# Patient Record
Sex: Male | Born: 1940 | Race: Black or African American | Hispanic: No | Marital: Married | Smoking: Former smoker
Health system: Southern US, Community
[De-identification: ages and names within clinical notes are randomized; demographics above are authoritative.]

## PROBLEM LIST (undated history)

## (undated) DIAGNOSIS — I639 Cerebral infarction, unspecified: Secondary | ICD-10-CM

## (undated) DIAGNOSIS — I10 Essential (primary) hypertension: Secondary | ICD-10-CM

## (undated) DIAGNOSIS — E7800 Pure hypercholesterolemia, unspecified: Secondary | ICD-10-CM

## (undated) DIAGNOSIS — I219 Acute myocardial infarction, unspecified: Secondary | ICD-10-CM

## (undated) DIAGNOSIS — F0390 Unspecified dementia without behavioral disturbance: Secondary | ICD-10-CM

## (undated) HISTORY — PX: CORONARY ANGIOPLASTY: SHX604

---

## 2015-06-23 DIAGNOSIS — I6523 Occlusion and stenosis of bilateral carotid arteries: Secondary | ICD-10-CM | POA: Diagnosis not present

## 2015-06-23 DIAGNOSIS — I639 Cerebral infarction, unspecified: Secondary | ICD-10-CM | POA: Diagnosis not present

## 2015-06-23 DIAGNOSIS — R29818 Other symptoms and signs involving the nervous system: Secondary | ICD-10-CM | POA: Diagnosis not present

## 2015-06-23 DIAGNOSIS — R4182 Altered mental status, unspecified: Secondary | ICD-10-CM | POA: Diagnosis not present

## 2015-06-23 DIAGNOSIS — I10 Essential (primary) hypertension: Secondary | ICD-10-CM | POA: Diagnosis not present

## 2015-06-23 DIAGNOSIS — I6601 Occlusion and stenosis of right middle cerebral artery: Secondary | ICD-10-CM | POA: Diagnosis not present

## 2015-06-23 DIAGNOSIS — R531 Weakness: Secondary | ICD-10-CM | POA: Diagnosis not present

## 2015-06-24 DIAGNOSIS — I639 Cerebral infarction, unspecified: Secondary | ICD-10-CM | POA: Diagnosis not present

## 2015-06-24 DIAGNOSIS — I679 Cerebrovascular disease, unspecified: Secondary | ICD-10-CM | POA: Diagnosis not present

## 2015-06-24 DIAGNOSIS — R531 Weakness: Secondary | ICD-10-CM | POA: Diagnosis not present

## 2015-06-24 DIAGNOSIS — J929 Pleural plaque without asbestos: Secondary | ICD-10-CM | POA: Diagnosis not present

## 2015-06-24 DIAGNOSIS — I10 Essential (primary) hypertension: Secondary | ICD-10-CM | POA: Diagnosis not present

## 2015-06-24 DIAGNOSIS — R079 Chest pain, unspecified: Secondary | ICD-10-CM | POA: Diagnosis not present

## 2015-06-24 DIAGNOSIS — R918 Other nonspecific abnormal finding of lung field: Secondary | ICD-10-CM | POA: Diagnosis not present

## 2015-06-25 DIAGNOSIS — R600 Localized edema: Secondary | ICD-10-CM | POA: Diagnosis not present

## 2015-06-25 DIAGNOSIS — R531 Weakness: Secondary | ICD-10-CM | POA: Diagnosis not present

## 2015-06-25 DIAGNOSIS — I639 Cerebral infarction, unspecified: Secondary | ICD-10-CM | POA: Diagnosis not present

## 2015-06-26 DIAGNOSIS — I639 Cerebral infarction, unspecified: Secondary | ICD-10-CM | POA: Diagnosis not present

## 2015-06-26 DIAGNOSIS — I10 Essential (primary) hypertension: Secondary | ICD-10-CM | POA: Diagnosis not present

## 2015-06-27 DIAGNOSIS — I69391 Dysphagia following cerebral infarction: Secondary | ICD-10-CM | POA: Diagnosis not present

## 2015-06-28 DIAGNOSIS — R1310 Dysphagia, unspecified: Secondary | ICD-10-CM | POA: Diagnosis not present

## 2015-06-28 DIAGNOSIS — Z4659 Encounter for fitting and adjustment of other gastrointestinal appliance and device: Secondary | ICD-10-CM | POA: Diagnosis not present

## 2015-06-28 DIAGNOSIS — J984 Other disorders of lung: Secondary | ICD-10-CM | POA: Diagnosis not present

## 2015-06-28 DIAGNOSIS — R531 Weakness: Secondary | ICD-10-CM | POA: Diagnosis not present

## 2015-06-28 DIAGNOSIS — I6789 Other cerebrovascular disease: Secondary | ICD-10-CM | POA: Diagnosis not present

## 2015-06-30 DIAGNOSIS — I6789 Other cerebrovascular disease: Secondary | ICD-10-CM | POA: Diagnosis not present

## 2015-07-02 DIAGNOSIS — M6281 Muscle weakness (generalized): Secondary | ICD-10-CM | POA: Diagnosis not present

## 2015-07-02 DIAGNOSIS — F05 Delirium due to known physiological condition: Secondary | ICD-10-CM | POA: Diagnosis not present

## 2015-07-02 DIAGNOSIS — R54 Age-related physical debility: Secondary | ICD-10-CM | POA: Diagnosis not present

## 2015-07-02 DIAGNOSIS — H05229 Edema of unspecified orbit: Secondary | ICD-10-CM | POA: Diagnosis not present

## 2015-07-02 DIAGNOSIS — R278 Other lack of coordination: Secondary | ICD-10-CM | POA: Diagnosis not present

## 2015-07-02 DIAGNOSIS — R261 Paralytic gait: Secondary | ICD-10-CM | POA: Diagnosis not present

## 2015-07-02 DIAGNOSIS — R41841 Cognitive communication deficit: Secondary | ICD-10-CM | POA: Diagnosis not present

## 2015-07-02 DIAGNOSIS — I639 Cerebral infarction, unspecified: Secondary | ICD-10-CM | POA: Diagnosis not present

## 2015-07-02 DIAGNOSIS — Z9181 History of falling: Secondary | ICD-10-CM | POA: Diagnosis not present

## 2015-07-02 DIAGNOSIS — I6789 Other cerebrovascular disease: Secondary | ICD-10-CM | POA: Diagnosis not present

## 2015-07-02 DIAGNOSIS — E785 Hyperlipidemia, unspecified: Secondary | ICD-10-CM | POA: Diagnosis not present

## 2015-07-02 DIAGNOSIS — Z7409 Other reduced mobility: Secondary | ICD-10-CM | POA: Diagnosis not present

## 2015-07-02 DIAGNOSIS — I519 Heart disease, unspecified: Secondary | ICD-10-CM | POA: Diagnosis not present

## 2015-07-02 DIAGNOSIS — F5101 Primary insomnia: Secondary | ICD-10-CM | POA: Diagnosis not present

## 2015-07-02 DIAGNOSIS — I69354 Hemiplegia and hemiparesis following cerebral infarction affecting left non-dominant side: Secondary | ICD-10-CM | POA: Diagnosis not present

## 2015-07-02 DIAGNOSIS — R1312 Dysphagia, oropharyngeal phase: Secondary | ICD-10-CM | POA: Diagnosis not present

## 2015-07-02 DIAGNOSIS — I10 Essential (primary) hypertension: Secondary | ICD-10-CM | POA: Diagnosis not present

## 2015-07-02 DIAGNOSIS — M1380 Other specified arthritis, unspecified site: Secondary | ICD-10-CM | POA: Diagnosis not present

## 2015-07-02 DIAGNOSIS — R410 Disorientation, unspecified: Secondary | ICD-10-CM | POA: Diagnosis not present

## 2015-07-31 DIAGNOSIS — I10 Essential (primary) hypertension: Secondary | ICD-10-CM | POA: Diagnosis not present

## 2015-07-31 DIAGNOSIS — R410 Disorientation, unspecified: Secondary | ICD-10-CM | POA: Diagnosis not present

## 2015-07-31 DIAGNOSIS — Z9181 History of falling: Secondary | ICD-10-CM | POA: Diagnosis not present

## 2015-07-31 DIAGNOSIS — R54 Age-related physical debility: Secondary | ICD-10-CM | POA: Diagnosis not present

## 2015-07-31 DIAGNOSIS — M1380 Other specified arthritis, unspecified site: Secondary | ICD-10-CM | POA: Diagnosis not present

## 2015-07-31 DIAGNOSIS — I639 Cerebral infarction, unspecified: Secondary | ICD-10-CM | POA: Diagnosis not present

## 2015-07-31 DIAGNOSIS — I519 Heart disease, unspecified: Secondary | ICD-10-CM | POA: Diagnosis not present

## 2015-07-31 DIAGNOSIS — F05 Delirium due to known physiological condition: Secondary | ICD-10-CM | POA: Diagnosis not present

## 2015-08-11 DIAGNOSIS — I519 Heart disease, unspecified: Secondary | ICD-10-CM | POA: Diagnosis not present

## 2015-08-11 DIAGNOSIS — I10 Essential (primary) hypertension: Secondary | ICD-10-CM | POA: Diagnosis not present

## 2015-08-11 DIAGNOSIS — Z9181 History of falling: Secondary | ICD-10-CM | POA: Diagnosis not present

## 2015-08-11 DIAGNOSIS — R54 Age-related physical debility: Secondary | ICD-10-CM | POA: Diagnosis not present

## 2015-08-11 DIAGNOSIS — F05 Delirium due to known physiological condition: Secondary | ICD-10-CM | POA: Diagnosis not present

## 2015-08-11 DIAGNOSIS — H05229 Edema of unspecified orbit: Secondary | ICD-10-CM | POA: Diagnosis not present

## 2015-08-11 DIAGNOSIS — R410 Disorientation, unspecified: Secondary | ICD-10-CM | POA: Diagnosis not present

## 2015-08-11 DIAGNOSIS — I639 Cerebral infarction, unspecified: Secondary | ICD-10-CM | POA: Diagnosis not present

## 2015-08-18 ENCOUNTER — Ambulatory Visit (INDEPENDENT_AMBULATORY_CARE_PROVIDER_SITE_OTHER): Payer: Medicare Other | Admitting: Family Medicine

## 2015-08-18 ENCOUNTER — Encounter: Payer: Self-pay | Admitting: Family Medicine

## 2015-08-18 VITALS — BP 102/60 | HR 64 | Resp 16 | Wt 125.4 lb

## 2015-08-18 DIAGNOSIS — I10 Essential (primary) hypertension: Secondary | ICD-10-CM

## 2015-08-18 DIAGNOSIS — E785 Hyperlipidemia, unspecified: Secondary | ICD-10-CM | POA: Diagnosis not present

## 2015-08-18 DIAGNOSIS — Z87891 Personal history of nicotine dependence: Secondary | ICD-10-CM | POA: Diagnosis not present

## 2015-08-18 DIAGNOSIS — F1011 Alcohol abuse, in remission: Secondary | ICD-10-CM

## 2015-08-18 DIAGNOSIS — F1010 Alcohol abuse, uncomplicated: Secondary | ICD-10-CM | POA: Diagnosis not present

## 2015-08-18 DIAGNOSIS — Z79899 Other long term (current) drug therapy: Secondary | ICD-10-CM

## 2015-08-18 DIAGNOSIS — I6930 Unspecified sequelae of cerebral infarction: Secondary | ICD-10-CM | POA: Diagnosis not present

## 2015-08-18 LAB — CBC WITH DIFFERENTIAL/PLATELET
BASOS ABS: 0 {cells}/uL (ref 0–200)
BASOS PCT: 0 %
EOS ABS: 120 {cells}/uL (ref 15–500)
Eosinophils Relative: 2 %
HEMATOCRIT: 39.1 % (ref 38.5–50.0)
HEMOGLOBIN: 13.3 g/dL (ref 13.2–17.1)
LYMPHS ABS: 2280 {cells}/uL (ref 850–3900)
Lymphocytes Relative: 38 %
MCH: 32 pg (ref 27.0–33.0)
MCHC: 34 g/dL (ref 32.0–36.0)
MCV: 94 fL (ref 80.0–100.0)
MONO ABS: 780 {cells}/uL (ref 200–950)
MPV: 9.4 fL (ref 7.5–12.5)
Monocytes Relative: 13 %
NEUTROS ABS: 2820 {cells}/uL (ref 1500–7800)
Neutrophils Relative %: 47 %
Platelets: 306 10*3/uL (ref 140–400)
RBC: 4.16 MIL/uL — ABNORMAL LOW (ref 4.20–5.80)
RDW: 13.8 % (ref 11.0–15.0)
WBC: 6 10*3/uL (ref 4.0–10.5)

## 2015-08-18 NOTE — Progress Notes (Signed)
   Subjective:    Patient ID: Calvin Diaz, male    DOB: 1940/12/22, 75 y.o.   MRN: [id number]  HPI Here for an initial visit after moving to this state to live with his brother. He was diagnosed with a CVA in May. After the hospital evaluation and treatment he was sent to a rehabilitation unit and has subsequently moved here to be with his brother on July 21. He has been seen by PT, OT, speech therapy and also Child psychotherapist. He has a history of alcohol abuse as well as cigarette abuse. Presently he is not to do either. He continues on his statin drug as well as blood pressure medication and several other medications including famotidine. He has no particular concerns or complaints. His wife and brother are in the room with him. He also is using and Namenda.   Review of Systems     Objective:   Physical Exam Alert and in no distress. Oriented only to person. Tympanic membranes and canals are normal. Pharyngeal area is normal. Neck is supple without adenopathy or thyromegaly. Cardiac exam shows a regular sinus rhythm without murmurs or gallops. Lungs are clear to auscultation.        Assessment & Plan:  History of CVA with residual deficit - Plan: CBC with Differential/Platelet, Comprehensive metabolic panel  Former smoker  History of alcohol abuse - Plan: CBC with Differential/Platelet, Comprehensive metabolic panel  Hyperlipidemia - Plan: Lipid panel  Essential hypertension - Plan: CBC with Differential/Platelet, Comprehensive metabolic panel  Encounter for long-term (current) use of medications - Plan: CBC with Differential/Platelet, Comprehensive metabolic panel, Lipid panel Recommend stopping some of the medications including famotidine and possibly his symptoms supplement. I will do routine blood screening on him. He will remain alcohol and cigarette free and continue on his other medications. I will ask for her discomfort from the hospital and from the rehabilitation facility.  Follow-up here in one month.

## 2015-08-19 LAB — COMPREHENSIVE METABOLIC PANEL
ALBUMIN: 3.8 g/dL (ref 3.6–5.1)
ALT: 14 U/L (ref 9–46)
AST: 16 U/L (ref 10–35)
Alkaline Phosphatase: 52 U/L (ref 40–115)
BUN: 16 mg/dL (ref 7–25)
CHLORIDE: 98 mmol/L (ref 98–110)
CO2: 25 mmol/L (ref 20–31)
Calcium: 9.3 mg/dL (ref 8.6–10.3)
Creat: 0.98 mg/dL (ref 0.70–1.18)
Glucose, Bld: 97 mg/dL (ref 65–99)
POTASSIUM: 4.1 mmol/L (ref 3.5–5.3)
Sodium: 135 mmol/L (ref 135–146)
TOTAL PROTEIN: 7.5 g/dL (ref 6.1–8.1)
Total Bilirubin: 0.4 mg/dL (ref 0.2–1.2)

## 2015-08-19 LAB — LIPID PANEL
CHOL/HDL RATIO: 2.2 ratio (ref <=5.0)
CHOLESTEROL: 127 mg/dL (ref 125–200)
HDL: 57 mg/dL (ref >=40)
LDL Cholesterol: 57 mg/dL (ref <130)
TRIGLYCERIDES: 64 mg/dL (ref <150)
VLDL: 13 mg/dL (ref <30)

## 2015-08-20 ENCOUNTER — Telehealth: Payer: Self-pay | Admitting: Family Medicine

## 2015-08-20 NOTE — Telephone Encounter (Signed)
Received a call from [place] with [facility]. She states the home health services was requested for this pt by a hospital in [place]. She is requesting orders for home health. Please call Marchelle Folks at [phone].

## 2015-08-21 NOTE — Telephone Encounter (Signed)
Set this up 

## 2015-08-23 NOTE — Telephone Encounter (Signed)
Left message for Amanda to call me back.

## 2015-08-24 NOTE — Telephone Encounter (Signed)
Called and left another message to call me back.

## 2015-08-27 NOTE — Telephone Encounter (Signed)
Talked with Marchelle Folks about orders 2 x a week for 8 weeks nursing gave verbal order okay and for them to send over papers to sign

## 2015-09-06 ENCOUNTER — Emergency Department (HOSPITAL_COMMUNITY): Payer: Medicare Other

## 2015-09-06 ENCOUNTER — Encounter (HOSPITAL_COMMUNITY): Payer: Self-pay | Admitting: *Deleted

## 2015-09-06 DIAGNOSIS — F0390 Unspecified dementia without behavioral disturbance: Secondary | ICD-10-CM | POA: Diagnosis present

## 2015-09-06 DIAGNOSIS — R079 Chest pain, unspecified: Secondary | ICD-10-CM | POA: Diagnosis present

## 2015-09-06 DIAGNOSIS — E871 Hypo-osmolality and hyponatremia: Secondary | ICD-10-CM | POA: Diagnosis not present

## 2015-09-06 DIAGNOSIS — I4891 Unspecified atrial fibrillation: Secondary | ICD-10-CM | POA: Diagnosis not present

## 2015-09-06 DIAGNOSIS — Z79899 Other long term (current) drug therapy: Secondary | ICD-10-CM

## 2015-09-06 DIAGNOSIS — I480 Paroxysmal atrial fibrillation: Secondary | ICD-10-CM | POA: Diagnosis not present

## 2015-09-06 DIAGNOSIS — K219 Gastro-esophageal reflux disease without esophagitis: Secondary | ICD-10-CM | POA: Diagnosis present

## 2015-09-06 DIAGNOSIS — E785 Hyperlipidemia, unspecified: Secondary | ICD-10-CM | POA: Diagnosis not present

## 2015-09-06 DIAGNOSIS — Z7289 Other problems related to lifestyle: Secondary | ICD-10-CM

## 2015-09-06 DIAGNOSIS — I10 Essential (primary) hypertension: Secondary | ICD-10-CM | POA: Diagnosis present

## 2015-09-06 DIAGNOSIS — R0789 Other chest pain: Secondary | ICD-10-CM | POA: Diagnosis not present

## 2015-09-06 DIAGNOSIS — Z8673 Personal history of transient ischemic attack (TIA), and cerebral infarction without residual deficits: Secondary | ICD-10-CM

## 2015-09-06 DIAGNOSIS — Z87891 Personal history of nicotine dependence: Secondary | ICD-10-CM

## 2015-09-06 DIAGNOSIS — Z9861 Coronary angioplasty status: Secondary | ICD-10-CM

## 2015-09-06 LAB — CBC
HCT: 37.9 % — ABNORMAL LOW (ref 39.0–52.0)
Hemoglobin: 13 g/dL (ref 13.0–17.0)
MCH: 32 pg (ref 26.0–34.0)
MCHC: 34.3 g/dL (ref 30.0–36.0)
MCV: 93.3 fL (ref 78.0–100.0)
PLATELETS: 311 10*3/uL (ref 150–400)
RBC: 4.06 MIL/uL — AB (ref 4.22–5.81)
RDW: 12.9 % (ref 11.5–15.5)
WBC: 4.7 10*3/uL (ref 4.0–10.5)

## 2015-09-06 LAB — BASIC METABOLIC PANEL
Anion gap: 5 (ref 5–15)
BUN: 21 mg/dL — AB (ref 6–20)
CALCIUM: 9.3 mg/dL (ref 8.9–10.3)
CHLORIDE: 101 mmol/L (ref 101–111)
CO2: 26 mmol/L (ref 22–32)
CREATININE: 1.08 mg/dL (ref 0.61–1.24)
GFR calc non Af Amer: >60 mL/min (ref >60)
Glucose, Bld: 96 mg/dL (ref 65–99)
Potassium: 4.3 mmol/L (ref 3.5–5.1)
Sodium: 132 mmol/L — ABNORMAL LOW (ref 135–145)

## 2015-09-06 LAB — I-STAT TROPONIN, ED: TROPONIN I, POC: 0 ng/mL (ref 0.00–0.08)

## 2015-09-06 NOTE — ED Triage Notes (Signed)
Pt states he woke up in bed with central non radiating chest pain. Pt reports mild nausea. Pt denies any other symptoms. Onset of symptoms 30 minutes prior to arrival.

## 2015-09-07 ENCOUNTER — Other Ambulatory Visit (HOSPITAL_COMMUNITY): Payer: Medicare Other

## 2015-09-07 ENCOUNTER — Encounter (HOSPITAL_COMMUNITY): Payer: Self-pay | Admitting: Family Medicine

## 2015-09-07 ENCOUNTER — Inpatient Hospital Stay (HOSPITAL_COMMUNITY)
Admission: EM | Admit: 2015-09-07 | Discharge: 2015-09-09 | DRG: 309 | Disposition: A | Discharge status: Home / Self Care | Payer: Medicare Other | Attending: Internal Medicine | Admitting: Internal Medicine

## 2015-09-07 DIAGNOSIS — E785 Hyperlipidemia, unspecified: Secondary | ICD-10-CM | POA: Diagnosis present

## 2015-09-07 DIAGNOSIS — Z87891 Personal history of nicotine dependence: Secondary | ICD-10-CM | POA: Diagnosis not present

## 2015-09-07 DIAGNOSIS — Z9861 Coronary angioplasty status: Secondary | ICD-10-CM | POA: Diagnosis not present

## 2015-09-07 DIAGNOSIS — I6930 Unspecified sequelae of cerebral infarction: Secondary | ICD-10-CM

## 2015-09-07 DIAGNOSIS — E871 Hypo-osmolality and hyponatremia: Secondary | ICD-10-CM | POA: Diagnosis present

## 2015-09-07 DIAGNOSIS — I10 Essential (primary) hypertension: Secondary | ICD-10-CM | POA: Diagnosis present

## 2015-09-07 DIAGNOSIS — I4891 Unspecified atrial fibrillation: Secondary | ICD-10-CM | POA: Diagnosis present

## 2015-09-07 DIAGNOSIS — F1099 Alcohol use, unspecified with unspecified alcohol-induced disorder: Secondary | ICD-10-CM

## 2015-09-07 DIAGNOSIS — R079 Chest pain, unspecified: Secondary | ICD-10-CM | POA: Diagnosis not present

## 2015-09-07 DIAGNOSIS — I480 Paroxysmal atrial fibrillation: Secondary | ICD-10-CM | POA: Diagnosis present

## 2015-09-07 DIAGNOSIS — IMO0002 Reserved for concepts with insufficient information to code with codable children: Secondary | ICD-10-CM | POA: Diagnosis present

## 2015-09-07 DIAGNOSIS — F0390 Unspecified dementia without behavioral disturbance: Secondary | ICD-10-CM | POA: Diagnosis present

## 2015-09-07 DIAGNOSIS — K219 Gastro-esophageal reflux disease without esophagitis: Secondary | ICD-10-CM | POA: Diagnosis present

## 2015-09-07 DIAGNOSIS — F1090 Alcohol use, unspecified, uncomplicated: Secondary | ICD-10-CM | POA: Diagnosis present

## 2015-09-07 DIAGNOSIS — Z8673 Personal history of transient ischemic attack (TIA), and cerebral infarction without residual deficits: Secondary | ICD-10-CM | POA: Diagnosis not present

## 2015-09-07 DIAGNOSIS — Z7289 Other problems related to lifestyle: Secondary | ICD-10-CM | POA: Diagnosis not present

## 2015-09-07 DIAGNOSIS — Z79899 Other long term (current) drug therapy: Secondary | ICD-10-CM | POA: Diagnosis not present

## 2015-09-07 HISTORY — DX: Essential (primary) hypertension: I10

## 2015-09-07 HISTORY — DX: Acute myocardial infarction, unspecified: I21.9

## 2015-09-07 HISTORY — DX: Pure hypercholesterolemia, unspecified: E78.00

## 2015-09-07 HISTORY — DX: Unspecified dementia, unspecified severity, without behavioral disturbance, psychotic disturbance, mood disturbance, and anxiety: F03.90

## 2015-09-07 HISTORY — DX: Cerebral infarction, unspecified: I63.9

## 2015-09-07 LAB — TROPONIN I: Troponin I: <0.03 ng/mL (ref <0.03)

## 2015-09-07 LAB — HEPARIN LEVEL (UNFRACTIONATED)
HEPARIN UNFRACTIONATED: 0.43 [IU]/mL (ref 0.30–0.70)
Heparin Unfractionated: 0.38 IU/mL (ref 0.30–0.70)

## 2015-09-07 LAB — MAGNESIUM: MAGNESIUM: 2 mg/dL (ref 1.7–2.4)

## 2015-09-07 LAB — TSH: TSH: 0.639 u[IU]/mL (ref 0.350–4.500)

## 2015-09-07 MED ORDER — GI COCKTAIL ~~LOC~~: 30.0000 mL | Freq: Four times a day (QID) | ORAL | Status: DC | PRN

## 2015-09-07 MED ORDER — PRAVASTATIN SODIUM 40 MG PO TABS
80.0000 mg | ORAL_TABLET | Freq: Every day | ORAL | Status: DC
  Administered 2015-09-07 – 2015-09-08 (×2): 80 mg via ORAL
  Filled 2015-09-07 (×2): qty 2

## 2015-09-07 MED ORDER — HEPARIN BOLUS VIA INFUSION
3500.0000 [IU] | Freq: Once | INTRAVENOUS | Status: AC
  Administered 2015-09-07: 3500 [IU] via INTRAVENOUS
  Filled 2015-09-07: qty 3500

## 2015-09-07 MED ORDER — ONDANSETRON HCL 4 MG/2ML IJ SOLN: 4.0000 mg | Freq: Four times a day (QID) | INTRAMUSCULAR | Status: DC | PRN

## 2015-09-07 MED ORDER — HEPARIN (PORCINE) IN NACL 100-0.45 UNIT/ML-% IJ SOLN
800.0000 [IU]/h | INTRAMUSCULAR | Status: DC
  Administered 2015-09-07 (×2): 800 [IU]/h via INTRAVENOUS
  Filled 2015-09-07 (×2): qty 250

## 2015-09-07 MED ORDER — ACETAMINOPHEN 325 MG PO TABS: 650.0000 mg | ORAL_TABLET | ORAL | Status: DC | PRN

## 2015-09-07 MED ORDER — ASPIRIN 81 MG PO CHEW
324.0000 mg | CHEWABLE_TABLET | Freq: Once | ORAL | Status: AC
  Administered 2015-09-07: 324 mg via ORAL
  Filled 2015-09-07: qty 4

## 2015-09-07 MED ORDER — ASPIRIN 81 MG PO CHEW
81.0000 mg | CHEWABLE_TABLET | Freq: Every day | ORAL | Status: DC
Start: 2015-09-07 — End: 2015-09-09
  Administered 2015-09-07 – 2015-09-09 (×3): 81 mg via ORAL
  Filled 2015-09-07 (×3): qty 1

## 2015-09-07 MED ORDER — AMLODIPINE BESYLATE 10 MG PO TABS
10.0000 mg | ORAL_TABLET | Freq: Every day | ORAL | Status: DC
  Administered 2015-09-08: 10 mg via ORAL
  Filled 2015-09-07 (×3): qty 1

## 2015-09-07 NOTE — Progress Notes (Signed)
ANTICOAGULATION CONSULT NOTE - Initial Consult  Pharmacy Consult for Heparin Indication: atrial fibrillation  No Known Allergies  Patient Measurements: Height: 5\' 11"  (180.3 cm) Weight: 125 lb (56.7 kg) IBW/kg (Calculated) : 75.3 Heparin Dosing Weight: 57 kg  Vital Signs: Temp: 98.5 F (36.9 C) (08/14 2311) Temp Source: Oral (08/14 2311) BP: 107/65 (08/14 2311) Pulse Rate: 66 (08/14 2311)  Labs:  Recent Labs  09/06/15 2318  HGB 13.0  HCT 37.9*  PLT 311  CREATININE 1.08    Estimated Creatinine Clearance: 48.1 mL/min (by C-G formula based on SCr of 1.08 mg/dL).   Medical History: Past Medical History:  Diagnosis Date  . Dementia   . High cholesterol   . Hypertension   . Stroke [facility](HCC)     Medications:  See electronic med rec  Assessment: 75 y.o. M presents with CP. Pt found to be in afib. To begin heparin. CBC ok on admission. No AC PTA. Noted pt with h/o CVA May 2017.  Goal of Therapy:  Heparin level 0.3-0.7 units/ml Monitor platelets by anticoagulation protocol: Yes   Plan:  Heparin IV bolus 3500 units Heparin gtt at 800 units/hr Will f/u heparin level in 8 hours Daily heparin level and CBC F/u plans for oral [facility] medication  Christoper Fabianaron Lionel Woodberry, PharmD, BCPS Clinical pharmacist, pager [phone] 09/07/2015,1:16 AM

## 2015-09-07 NOTE — Care Management Note (Signed)
Case Management Note Donn PieriniKristi Pahola Dimmitt RN, BSN Unit 2W-Case Manager [phone]  Patient Details  Name: Jeani HawkingJames Wimbush MRN: [id number] Date of Birth: [birth year]  Subjective/Objective:     Pt admitted with afib/chest pain               Action/Plan: PTA pt lived at home with brother- ([place] [facility] [place] [facility] to live with brother)- per brother- pt active with HH services- [facility]- RN/PT/OT/ST/aide- will need resumption orders for discharge- received referral for insurance coverage of Noacs- per brother pt recently got part D coverage with [employer]- ID #[id number]- insurance check submitted for Noacs- CM to f/u on what drug is selected.   Expected Discharge Date:                  Expected Discharge Plan:     In-House Referral:     Discharge planning Services     Post Acute Care Choice:    Choice offered to:     DME Arranged:    DME Agency:     HH Arranged:    HH Agency:     Status of Service:     If discussed at [employer] Meetings, dates discussed:    Additional Comments:  Darrold SpanWebster, Elzabeth Mcquerry Hall, RN 09/07/2015, 2:34 PM

## 2015-09-07 NOTE — Progress Notes (Signed)
ANTICOAGULATION CONSULT NOTE  Pharmacy Consult for Heparin Indication: atrial fibrillation  No Known Allergies  Patient Measurements: Height: 5\' 11"  (180.3 cm) Weight: 126 lb 14.4 oz (57.6 kg) IBW/kg (Calculated) : 75.3 Heparin Dosing Weight: 57 kg  Vital Signs: Temp: 97.9 F (36.6 C) (08/15 0810) Temp Source: Oral (08/15 0810) BP: 90/60 (08/15 0810) Pulse Rate: 64 (08/15 0810)  Labs:  Recent Labs  09/06/15 2318 09/07/15 0300 09/07/15 0529  HGB 13.0  --   --   HCT 37.9*  --   --   PLT 311  --   --   CREATININE 1.08  --   --   TROPONINI  --  <0.03 <0.03    Estimated Creatinine Clearance: 48.9 mL/min (by C-G formula based on SCr of 1.08 mg/dL).   Medical History: Past Medical History:  Diagnosis Date  . Dementia   . High cholesterol   . Hypertension   . Myocardial infarction (HCC)   . Stroke [facility](HCC)     Medications:  See electronic med rec  Assessment: 75 y.o. M presents with CP. Pt found to be in afib, continuing on heparin per Rx. CBC ok on admission. No AC PTA. Noted pt with h/o CVA May 2017. CBC wnl, no bleed documented.  HL therapeutic x1 on 800 units/h.  Goal of Therapy:  Heparin level 0.3-0.7 units/ml Monitor platelets by anticoagulation protocol: Yes   Plan:  Heparin gtt at 800 units/hr Will f/u heparin level in 8 hours to confirm Daily heparin level and CBC F/u plans for oral AC medication - considering DOAC  Babs BertinHaley Ortha Metts, PharmD, BCPS Clinical Pharmacist 09/07/2015 9:52 AM

## 2015-09-07 NOTE — H&P (Signed)
History and Physical  Patient Name: Calvin HawkingJames Evangelist     ZOX:[id number]    DOB: 02-24-1940    DOA: 09/07/2015 PCP: Carollee HerterLALONDE,JOHN CHARLES, MD   Patient coming from: Home     Chief Complaint: Chest pain  HPI: Calvin Diaz ("Theerss") is a 75 y.o. male with a past medical history significant for recent stroke, HTN, and possible MI with PCI and possible dementia who presents with chest pain.  History is collected from the patient who is a poor historian.  He reports that he was planning to go back to [facility] where he is from tonight, when he had sudden onset of severe squeezing chest pain around 8PM tonight.  This was not exertional, and not associated with nausea, diaphoresis, dyspnea and resolved after about an hour by itself.   The patient recently had a [facility] month ago [facility] in [place], he believes, was discharged to rehab, and from there came to [place] to stay with his brother.  He denies history of atrial fibrillation.  ED course: -Afebrile, heart rate and respirations normal, blood pressure low normal and SpO2 normal -Initial ECG showed rate controlled atrial fibrillation and troponin was negative. -Na 132, K 4.3, Cr 1.1 (baseline 1.0 three weeks ago), WBC 4.7, Hgb 13 -CXR clear -Records from [place] previous hospital[place] in [place] were not immediately available -[facility] was asked to admit for observation, serial troponins and risk stratification.     Review of Systems:  Pt complains of chest squeezing. All other systems negative except as just noted or noted in the history of present illness.  Past Medical History:  Diagnosis Date  . Dementia   . High cholesterol   . Hypertension   . Myocardial infarction (HCC)   . Stroke [facility](HCC)     Past Surgical History:  Procedure Laterality Date  . CORONARY ANGIOPLASTY      Social History: [place] in [place]  Patient walks unassisted since his stroke he reports.   He is an  active smoker.  He reports alcohol use and history of withdrawal shakes after quiting, but has not had any alcohol since coming to [place] three weeks ago.  No Known Allergies  Family history: family history includes Arrhythmia in his mother; Stroke in his maternal aunt.  Prior to Admission medications   Medication Sig Start Date End Date Taking? Authorizing Provider  amLODipine (NORVASC) 10 MG tablet Take 10 mg by mouth daily. 08/13/15   Historical Provider, MD  aspirin 81 MG tablet Take 81 mg by mouth daily.    Historical Provider, MD  Chlorpheniramine-DM (CORICIDIN HBP COUGH/COLD PO) Take by mouth.    Historical Provider, MD  famotidine (PEPCID) 20 MG tablet Take 20 mg by mouth 2 (two) times daily.    Historical Provider, MD  folic acid (FOLVITE) 1 MG tablet Take 1 mg by mouth daily. 08/13/15   Historical Provider, MD  Melatonin 3 MG CAPS Take by mouth.    Historical Provider, MD  memantine (NAMENDA) 5 MG tablet Take 10 mg by mouth daily. 08/13/15   Historical Provider, MD  Potassium Chloride ER 20 MEQ TBCR Take 1 tablet by mouth daily. 08/13/15   Historical Provider, MD  pravastatin (PRAVACHOL) 80 MG tablet Take 80 mg by mouth at bedtime. 08/13/15   Historical Provider, MD  thiamine (VITAMIN B-1) 100 MG tablet Take 100 mg by mouth daily.    Historical Provider, MD       Physical Exam: BP  107/65 (BP Location: Left Arm)   Pulse 66   Temp 98.5 F (36.9 C) (Oral)   Resp 16   Ht 5\' 11"  (1.803 m)   Wt 56.7 kg (125 lb)   SpO2 97%   BMI 17.43 kg/m  General appearance: Thin, adult male, alert and in no acute distress.   Eyes: Anicteric, conjunctiva pink, lids and lashes normal.     ENT: No nasal deformity, discharge, or epistaxis.  OP moist without lesions.   Skin: Warm and dry.   Cardiac: RRR, nl S1-S2, no murmurs appreciated.  Capillary refill is brisk.  JVP normal.  No LE edema.  Radial and DP pulses 2+ and symmetric.  No carotid bruits. Respiratory: Normal respiratory rate and rhythm.   Diminished bilaterally, poor air movement, no wheezes. GI: Abdomen soft without rigidity.  No TTP. No ascites, distension.   MSK: No deformities or effusions.   Pain not reproduced with palpation of precordium.   Neuro: Sensorium intact and responding to questions, attention normal.  Seems slow to respond.  Memory is suspected to be poor, no collateral available.  Speech is fluent.  Appears to have some LEFT sided residual contractures/weakness.    Psych: Affect blunted.  No evidence of aural or visual hallucinations or delusions.       Labs on Admission:  The metabolic panel shows hyponatremia with otherwise normal electrolytes and renal function. The complete blood count shows no leukocytosis, anemia or thrombocytopenia. The initial troponin is negative.  Radiological Exams on Admission: Personally reviewed: Dg Chest 2 View  Result Date: 09/06/2015 CLINICAL DATA:  Acute onset of central chest pain. Nausea. Initial encounter. EXAM: CHEST  2 VIEW COMPARISON:  None. FINDINGS: The lungs are hyperexpanded, with flattening of the hemidiaphragms, compatible with COPD. Mild vascular congestion is noted. There is no evidence of pleural effusion or pneumothorax. The heart is normal in size; the mediastinal contour is within normal limits. No acute osseous abnormalities are seen. IMPRESSION: Mild vascular congestion noted.  Findings of COPD. Electronically Signed   By: Roanna RaiderJeffery  Chang M.D.   On: 09/06/2015 23:42    EKG: Independently reviewed. Rate 64, atrial fibrillation without previous for comparison, no ST segment changes.    Assessment/Plan 1. Chest pain: This is new.  The patient has HEART score of 5. Other potential causes of chest pain (PE, dissection, pancreatitis, pneumonia/effusion, pericarditis) are doubted.  We have been asked to admit the patient for observation and etiology consultation with Cardiology tomorrow.  -Serial troponins are ordered -Telemetry -Echocardiogram  ordered -Consult to cardiology, appreciate recommendations -Smoking cessation was recommended -Request records from [facility]   2. Atrial fibrillation: CHADS2Vasc 4 (age, HTN, previous CVA), possibly 5 if he really had a previous MI.  The patient lives in [place], and it is unclear if he is moving here permanently or going back (he seems to think he is going back, but I am not clear if he is capable of self-care, collateral is needed).  In the absence of established Cardiology care either here or [facility], we will admit for observation and consultation with Cardiology and to obtain collateral information from family. -Will continue heparin started in ED for now -Consult to Care Management for coverage of NOACs -Consult to Cardiology, appreciate cares -Check TSH, mag, echo  3. HTN and recent stroke:  Slightly low normal BP at admission. -Continue amlodipine with hold parameters  -Continue aspirin and statin  4. Dementia:  No collateral available.   -Hold memantine for  now  5. GERD:  Non-contributory. -Hold famotidine for now      DVT prophylaxis: None needed, on heparin bridge for Afib Diet: NPO until cleared by Cardiology Code Status: Full  Family Communication: None present  Disposition Plan: Anticipate overnight observation for serial troponins and subsequent risk stratification and recommendations for Afib by Cardiology.     Admission status: Telemetry, OBS   Medical decision making: Patient seen at 1:45 AM on 09/07/2015.  The patient was discussed with Dr. Preston Fleeting. What exists of the patient's chart was reviewed in depth and outside records from [facility] were requested.  Clinical condition: stable.      Alberteen Sam [facility] Pager [phone]

## 2015-09-07 NOTE — Progress Notes (Signed)
ANTICOAGULATION CONSULT NOTE  Pharmacy Consult for Heparin Indication: atrial fibrillation  No Known Allergies  Patient Measurements: Height: 5\' 11"  (180.3 cm) Weight: 126 lb 14.4 oz (57.6 kg) IBW/kg (Calculated) : 75.3 Heparin Dosing Weight: 57 kg  Vital Signs: Temp: 98.2 F (36.8 C) (08/15 1740) Temp Source: Oral (08/15 1740) BP: 98/63 (08/15 1740) Pulse Rate: 77 (08/15 1740)  Labs:  Recent Labs  09/06/15 2318  09/07/15 0529 09/07/15 0958 09/07/15 1426 09/07/15 1911  HGB 13.0  --   --   --   --   --   HCT 37.9*  --   --   --   --   --   PLT 311  --   --   --   --   --   HEPARINUNFRC  --   --   --  0.43  --  0.38  CREATININE 1.08  --   --   --   --   --   TROPONINI  --   < > <0.03 <0.03 <0.03  <0.03  --   < > = values in this interval not displayed.  Estimated Creatinine Clearance: 48.9 mL/min (by C-G formula based on SCr of 1.08 mg/dL).   Assessment: 75 y.o. M presents with CP. Pt found to be in afib, continuing on heparin. No AC PTA. Noted pt with h/o CVA May 2017.  HL therapeutic x2 on 800 units/h. Hgb 13, plts 311- no bleeding noted.  Goal of Therapy:  Heparin level 0.3-0.7 units/ml Monitor platelets by anticoagulation protocol: Yes   Plan:  Continue Heparin gtt at 800 units/hr Daily heparin level and CBC F/u plans for oral AC medication - considering DOAC  Trannie Bardales D. Lakesia Dahle, PharmD, BCPS Clinical Pharmacist Pager: [phone] 09/07/2015 7:55 PM

## 2015-09-07 NOTE — Progress Notes (Signed)
Patient admitted after midnight. Please see H&P.  Appreciate cardiology -await echo -[facility] -feed patient -per family patient to [place] living [place], [facility]  [employer] CommunicationsJessica Maiana Hennigan DO

## 2015-09-07 NOTE — Consult Note (Signed)
Cardiology Consult    Patient ID: Calvin Diaz MRN: [id number], DOB/AGE: 06-21-1940   Admit date: 09/07/2015 Date of Consult: 09/07/2015  Primary Physician: Calvin HerterLALONDE,JOHN CHARLES, MD Primary Cardiologist: New Requesting Provider: Dr. Maryfrances Bunnellanford Reason for Consultation: New A-fib  Patient Profile    75 yo male with PMH of HTN/HLD/Dementia and recent Stroke who [facility] with reports of centralized chest pain.   Past Medical History   Past Medical History:  Diagnosis Date  . Dementia   . High cholesterol   . Hypertension   . Myocardial infarction (HCC)   . Stroke [facility](HCC)     Past Surgical History:  Procedure Laterality Date  . CORONARY ANGIOPLASTY       Allergies  No Known Allergies  History of Present Illness    Calvin Diaz is a 75 yo male who lived in [place]  until about a month ago when he moved here to live with family. Family reports he was in good state of health until about 81month ago when he was admitted to [facility] and diagnosed with a stroke. He was then discharged from there and came here to live with his brother. Brother reports he has been receiving in home health care with PT and speech therapy and has been doing well. The patient and his brother deny any significant cardiac hx including arrhthymias or heart catheterizations.   He reports sitting watching TV last night around 830pm when he developed a sudden onset of centralized chest pain that lasted about 30 minutes and then subsided. Denies any nausea, dyspnea, weakness or palpitations at that time. Brother reports the patient is fairly active around the home and has never complained of chest pain or palpitations since coming to live with him.   In the ED his labs showed stable electrolytes, trop cycled, neg x3, Hgb stable. Chest x-ray showed mild vascular congestion, with findings of COPD. His EKG did show new a fib with rate of 69. He was placed on IV heparin for  anticoagulation. Denies having any further episodes of chest pain since admission.   Inpatient Medications    . amLODipine  10 mg Oral Daily  . aspirin  81 mg Oral Daily  . pravastatin  80 mg Oral QHS    Family History    Family History  Problem Relation Age of Onset  . Arrhythmia Mother     Had pacemaker  . Stroke Maternal Aunt     Social History    Social History   Social History  . Marital status: Divorced    Spouse name: N/A  . Number of children: N/A  . Years of education: N/A   Occupational History  . Not on file.   Social History Main Topics  . Smoking status: Current Every Day Smoker    Types: Cigarettes  . Smokeless tobacco: Former NeurosurgeonUser    Quit date: 08/18/2015  . Alcohol use No     Comment: Previously daily  . Drug use: No  . Sexual activity: Not on file   Other Topics Concern  . Not on file   Social History Narrative  . No narrative on file     Review of Systems    General:  No chills, fever, night sweats or weight changes.  Cardiovascular:  See HPI Dermatological: No rash, lesions/masses Respiratory: See HPI Urologic: No hematuria, dysuria Abdominal:   No nausea, vomiting, diarrhea, bright red blood per rectum, melena, or hematemesis Neurologic:  No visual changes,  wkns, changes in mental status. All other systems reviewed and are otherwise negative except as noted above.  Physical Exam    Blood pressure 90/60, pulse 64, temperature 97.9 F (36.6 C), temperature source Oral, resp. rate 18, height 5\' 11"  (1.803 m), weight 126 lb 14.4 oz (57.6 kg), SpO2 100 %.  General: Pleasant older AA male, NAD Psych: Normal affect. Neuro: Alert and oriented. Moves all extremities spontaneously. Weakness to left upper and lower extermities HEENT: Normal  Neck: Supple without bruits or JVD. Lungs:  Resp regular and unlabored, CTA. Heart: Irregularly irregular no s3, s4, or murmurs. Abdomen: Soft, non-tender, non-distended, BS + x 4.  Extremities: No  clubbing, cyanosis or edema. DP/PT/Radials 2+ and equal bilaterally.  Labs    Troponin [facility] of Care Test)  Recent Labs  09/06/15 2333  TROPIPOC 0.00    Recent Labs  09/07/15 0300 09/07/15 0529  TROPONINI <0.03 <0.03   Lab Results  Component Value Date   WBC 4.7 09/06/2015   HGB 13.0 09/06/2015   HCT 37.9 (L) 09/06/2015   MCV 93.3 09/06/2015   PLT 311 09/06/2015    Recent Labs Lab 09/06/15 2318  NA 132*  K 4.3  CL 101  CO2 26  BUN 21*  CREATININE 1.08  CALCIUM 9.3  GLUCOSE 96   Lab Results  Component Value Date   CHOL 127 08/18/2015   HDL 57 08/18/2015   LDLCALC 57 08/18/2015   TRIG 64 08/18/2015   No results found for: [facility]   Radiology Studies    Dg Chest 2 View  Result Date: 09/06/2015 CLINICAL DATA:  Acute onset of central chest pain. Nausea. Initial encounter. EXAM: CHEST  2 VIEW COMPARISON:  None. FINDINGS: The lungs are hyperexpanded, with flattening of the hemidiaphragms, compatible with COPD. Mild vascular congestion is noted. There is no evidence of pleural effusion or pneumothorax. The heart is normal in size; the mediastinal contour is within normal limits. No acute osseous abnormalities are seen. IMPRESSION: Mild vascular congestion noted.  Findings of COPD. Electronically Signed   By: Roanna Raider M.D.   On: 09/06/2015 23:42    ECG & Cardiac Imaging    EKG: New A-fib Rate-67  Echo: None  Assessment & Plan    75 yo male with PMH of HTN/HLD/Dementia and recent Stroke who presented to the [facility] ED with reports of centralized chest pain. Found to be in new onset a-fib with rate in the 60s.  1. Chest pain: Reports a sudden onset of centralized chest pain that started at 830pm last night while watching TV, denies any other associated symptoms. Pain resolved after about on its own. Trops cycled neg x3. EKG shows new onset a-fib, but rate controlled. No further episodes of chest pain since this admission.  -- 2D echo is pending at  this time. Will follow results and wait for records from [facility] to determine further testing.   2. A-Fib: He and his brother deny any hx of arrhythmia or other significant cardiac hx. Found to be in new a-fib, rate controlled, in the ED. Currently on heparin for anticoagulation. Given his stable Cr could consider a DOAC in the future. His brother reports no definitive dx for the cause of his recent stroke, but there was question whether he could have had a-fib during that time but was asymptomatic.  -- This patients CHA2DS2-VASc Score and unadjusted Ischemic Stroke Rate (% per year) is equal to 4.8 % stroke rate/year from a score of  4 Above score calculated as 1 point each if present [CHF, HTN, DM, Vascular=MI/PAD/Aortic Plaque, Age if 65-74, or Male] Above score calculated as 2 points each if present [Age > 75, or Stroke/TIA/TE]   Signed, Laverda PageLindsay Roberts, NP-C Pager [phone] 09/07/2015, 8:31 AM  Pt seen and examined  I agree with findingas as noted above by L Roberts   Pt with CP while lying down  SOmewhat atypical  Doing OK prior with no CP or no SOB  Denies problems at present  ON exam Lungs are CTA  Cardiac  Irreg Irreg  No S3  No edema on extremity exam  Neuro deferred   I would recomm get[facility] Would get echo to evaluate LVEF   Cycle trop   Continue heparin for now. Now in afib  Will review with records  Should be on NOAC with CVA history    Further work up based on test results.   Dietrich PatesPaula Esteban Kobashigawa

## 2015-09-07 NOTE — Progress Notes (Signed)
Insurance check completed for NOACs [place] @ [employer] [employer] [facility] # [id number] OPT 2   1. XARELTO 20 MG DAILY  ( 30 )  COVER- YES  CO-PAY- $ 47.00  30 TAB  TIER- 3 DRUG  PRIOR APPROVAL - NO   2. PRADAXA 150 MG BID (30 )  COVER- YES  CO-PAY- $ 47.00 60 TAB  TIER- 3 DRUG  PRIOR APPROVAL - NO   3. ELIQUIS 5 MG  BID     AND  2.5 MG BID  COVER- YES                 YES  CO-PAY- $ 198.56             SAME  TIER- 4 DRUG                SAME  PRIOR APPROVAL - NO         NO   PHARMACY : [facility], [facility], [facility] AND [facility]

## 2015-09-07 NOTE — Care Management Obs Status (Signed)
MEDICARE OBSERVATION STATUS NOTIFICATION   Patient Details  Name: Calvin Diaz MRN: [id number] Date of Birth: 06/09/1940   Medicare Observation Status Notification Given:  Yes    Calvin Diaz, Calvin Heppler Hall, RN 09/07/2015, 3:12 PM

## 2015-09-07 NOTE — ED Provider Notes (Signed)
[facility]-EMERGENCY DEPT Provider Note   CSN: [id number] Arrival date & time: 09/06/15  2304  By signing my name below, I, Emmanuella Mensah, attest that this documentation has been prepared under the direction and in the presence of Dione Boozeavid Lasandra Batley, MD. Electronically Signed: Angelene GiovanniEmmanuella Mensah, ED Scribe. 09/07/15. 1:16 AM.    History   Chief Complaint Chief Complaint  Patient presents with  . Chest Pain    HPI Comments: Calvin Diaz is a 75 y.o. male with a hx of stroke (06/23/15), dementia, hypertension, and high cholesterol who presents to the Emergency Department complaining of an episode of 5/10 non-radiating dull substernal chest pain that lasted for an hour onset one hour PTA. He reports associated SOB during the episode. No alleviating factors noted. Pt has not tried any medications PTA. Pt takes a daily aspirin with his medications. He states that he has had these symptoms in the past. He reports that he is a former one pack a day smoker. He denies a hx of Afib or CHF. Pt reports that he is currently recovering from his stroke. No fever, chills, abdominal pain, or n/v.    The history is provided by the patient. No language interpreter was used.  Chest Pain   This is a new problem. The current episode started 1 to 2 hours ago. The problem has been resolved. The pain is present in the substernal region. The pain is at a severity of 5/10. The quality of the pain is described as dull. The pain does not radiate. Duration of episode(s) is 1 hour. Associated symptoms include shortness of breath. Pertinent negatives include no abdominal pain, no fever, no nausea and no vomiting. He has tried nothing for the symptoms. Risk factors include being elderly and male gender.  His past medical history is significant for hyperlipidemia and hypertension.    Past Medical History:  Diagnosis Date  . Dementia   . High cholesterol   . Hypertension   . Stroke [facility](HCC)     Patient Active Problem List   Diagnosis Date Noted  . History of CVA with residual deficit 08/18/2015  . Former smoker 08/18/2015  . History of alcohol abuse 08/18/2015  . Hyperlipidemia 08/18/2015    History reviewed. No pertinent surgical history.     Home Medications    Prior to Admission medications   Medication Sig Start Date End Date Taking? Authorizing Provider  amLODipine (NORVASC) 10 MG tablet Take 10 mg by mouth daily. 08/13/15   Historical Provider, MD  aspirin 81 MG tablet Take 81 mg by mouth daily.    Historical Provider, MD  Chlorpheniramine-DM (CORICIDIN HBP COUGH/COLD PO) Take by mouth.    Historical Provider, MD  famotidine (PEPCID) 20 MG tablet Take 20 mg by mouth 2 (two) times daily.    Historical Provider, MD  folic acid (FOLVITE) 1 MG tablet Take 1 mg by mouth daily. 08/13/15   Historical Provider, MD  Melatonin 3 MG CAPS Take by mouth.    Historical Provider, MD  memantine (NAMENDA) 5 MG tablet Take 10 mg by mouth daily. 08/13/15   Historical Provider, MD  Potassium Chloride ER 20 MEQ TBCR Take 1 tablet by mouth daily. 08/13/15   Historical Provider, MD  pravastatin (PRAVACHOL) 80 MG tablet Take 80 mg by mouth at bedtime. 08/13/15   Historical Provider, MD  thiamine (VITAMIN B-1) 100 MG tablet Take 100 mg by mouth daily.    Historical Provider, MD    Family History History reviewed. No pertinent family history.  Social History Social History  Substance Use Topics  . Smoking status: Former Games developer  . Smokeless tobacco: Former Neurosurgeon    Quit date: 08/18/2015  . Alcohol use No     Allergies   Review of patient's allergies indicates no known allergies.   Review of Systems Review of Systems  Constitutional: Negative for chills and fever.  Respiratory: Positive for shortness of breath.   Cardiovascular: Positive for chest pain.  Gastrointestinal: Negative for abdominal pain, nausea and vomiting.  All other systems reviewed and are negative.    Physical Exam Updated Vital Signs BP  107/65 (BP Location: Left Arm)   Pulse 66   Temp 98.5 F (36.9 C) (Oral)   Resp 16   Ht 5\' 11"  (1.803 m)   Wt 125 lb (56.7 kg)   SpO2 97%   BMI 17.43 kg/m   Physical Exam  Constitutional: He is oriented to person, place, and time. He appears well-developed and well-nourished.  HENT:  Head: Normocephalic and atraumatic.  Eyes: EOM are normal. Pupils are equal, round, and reactive to light.  Neck: Normal range of motion. Neck supple. No JVD present. Carotid bruit is not present.  Cardiovascular: Normal rate and normal heart sounds.  An irregular rhythm present.  No murmur heard. Pulmonary/Chest: Effort normal and breath sounds normal. He has no wheezes. He has no rales. He exhibits no tenderness.  Abdominal: Soft. He exhibits no distension and no mass. There is no tenderness.  Musculoskeletal: Normal range of motion. He exhibits no edema.  Lymphadenopathy:    He has no cervical adenopathy.  Neurological: He is alert and oriented to person, place, and time. No cranial nerve deficit.  Moderate left spastic hemiparesis, strength 3/5  Skin: Skin is warm and dry. No rash noted.  Psychiatric: He has a normal mood and affect. His behavior is normal. Judgment and thought content normal.  Nursing note and vitals reviewed.    ED Treatments / Results  DIAGNOSTIC STUDIES: Oxygen Saturation is 97% on RA, normal by my interpretation.    COORDINATION OF CARE: 1:16 AM- Pt advised of plan for treatment and pt agrees. Pt informed of his EKG results. He will receive chest x-ray and lab work for further evaluation.    Labs nor(all labs ordered are listed, but only abnormal results are displayed) Labs Reviewed  BASIC METABOLIC PANEL - Abnormal; Notable for the following:       Result Value   Sodium 132 (*)    BUN 21 (*)    All other components within normal limits  CBC - Abnormal; Notable for the following:    RBC 4.06 (*)    HCT 37.9 (*)    All other components within normal limits    HEPARIN LEVEL (UNFRACTIONATED)  I-STAT TROPOININ, ED    EKG  EKG Interpretation  Date/Time:  Monday September 06 2015 23:08:56 EDT Ventricular Rate:  69 PR Interval:    QRS Duration: 68 QT Interval:  406 QTC Calculation: 435 R Axis:   89 Text Interpretation:  Atrial fibrillation Anteroseptal infarct , age undetermined Abnormal ECG No old tracing to compare Confirmed by [facility]  MD, Sung Parodi ([id number]) on 09/06/2015 11:14:54 PM       Radiology Dg Chest 2 View  Result Date: 09/06/2015 CLINICAL DATA:  Acute onset of central chest pain. Nausea. Initial encounter. EXAM: CHEST  2 VIEW COMPARISON:  None. FINDINGS: The lungs are hyperexpanded, with flattening of the hemidiaphragms, compatible with COPD. Mild vascular congestion is noted. There is no  evidence of pleural effusion or pneumothorax. The heart is normal in size; the mediastinal contour is within normal limits. No acute osseous abnormalities are seen. IMPRESSION: Mild vascular congestion noted.  Findings of COPD. Electronically Signed   By: Roanna RaiderJeffery  Chang M.D.   On: 09/06/2015 23:42    Procedures Procedures (including critical care time)  Medications Ordered in ED Medications  heparin ADULT infusion 100 units/mL (25000 units/2450mL sodium chloride 0.45%) (800 Units/hr Intravenous New Bag/Given 09/07/15 0137)  aspirin chewable tablet 324 mg (324 mg Oral Given 09/07/15 0136)  heparin bolus via infusion 3,500 Units (3,500 Units Intravenous Bolus from Bag 09/07/15 0138)     Initial Impression / Assessment and Plan / ED Course  Dione Boozeavid Angelie Kram, MD has reviewed the triage vital signs and the nursing notes.  Pertinent labs & imaging results that were available during my care of the patient were reviewed by me and considered in my medical decision making (see chart for details).  Clinical Course    Chest pain of uncertain cause. Patient is a very poor historian and it is very difficult to make adequate assessment of the pain. Atrial fibrillation  of uncertain duration. Patient was not aware of irregular heartbeat. He gives no history of prior atrial fibrillation but I am not comfortable with his grasp of his history. He also states no history of hypertension or hyperlipidemia but is taking amlodipine and atorvastatin. With recent stroke, I wonder if he is having paroxysmal atrial fibrillation that would have caused the stroke. CHADS-VASC Score=3. He is started on heparin and given aspirin. Case is discussed with Dr. Maryfrances Bunnellanford of [facility] hospitalists who agrees to admit the patient to observation status. He will need cardiology evaluation regarding management of his atrial fibrillation.  Final Clinical Impressions(s) / ED Diagnoses   Final diagnoses:  Chest pain, unspecified chest pain type  Atrial fibrillation, unspecified type (HCC)   I personally performed the services described in this documentation, which was scribed in my presence. The recorded information has been reviewed and is accurate.      New Prescriptions New Prescriptions   No medications on file     Dione Boozeavid Kadden Osterhout, MD 09/07/15 [phone]

## 2015-09-08 ENCOUNTER — Inpatient Hospital Stay (HOSPITAL_COMMUNITY): Payer: Medicare Other

## 2015-09-08 DIAGNOSIS — I6930 Unspecified sequelae of cerebral infarction: Secondary | ICD-10-CM

## 2015-09-08 DIAGNOSIS — I4891 Unspecified atrial fibrillation: Secondary | ICD-10-CM

## 2015-09-08 LAB — CBC
HCT: 38.7 % — ABNORMAL LOW (ref 39.0–52.0)
HEMOGLOBIN: 13.3 g/dL (ref 13.0–17.0)
MCH: 31.8 pg (ref 26.0–34.0)
MCHC: 34.4 g/dL (ref 30.0–36.0)
MCV: 92.6 fL (ref 78.0–100.0)
Platelets: 341 10*3/uL (ref 150–400)
RBC: 4.18 MIL/uL — AB (ref 4.22–5.81)
RDW: 12.9 % (ref 11.5–15.5)
WBC: 5.2 10*3/uL (ref 4.0–10.5)

## 2015-09-08 LAB — BASIC METABOLIC PANEL
ANION GAP: 8 (ref 5–15)
BUN: 11 mg/dL (ref 6–20)
CHLORIDE: 99 mmol/L — AB (ref 101–111)
CO2: 27 mmol/L (ref 22–32)
CREATININE: 0.98 mg/dL (ref 0.61–1.24)
Calcium: 9.7 mg/dL (ref 8.9–10.3)
GFR calc non Af Amer: >60 mL/min (ref >60)
Glucose, Bld: 98 mg/dL (ref 65–99)
Potassium: 4.2 mmol/L (ref 3.5–5.1)
SODIUM: 134 mmol/L — AB (ref 135–145)

## 2015-09-08 LAB — HEPARIN LEVEL (UNFRACTIONATED): HEPARIN UNFRACTIONATED: 0.36 [IU]/mL (ref 0.30–0.70)

## 2015-09-08 LAB — ECHOCARDIOGRAM COMPLETE
Height: 71 in
Weight: 2030.4 oz

## 2015-09-08 NOTE — Progress Notes (Signed)
Subjective: No CP  Breathing is OK   Objective: Vitals:   09/07/15 1740 09/07/15 1900 09/08/15 0458 09/08/15 1143  BP: 98/63 111/63 115/68 109/66  Pulse: 77 78 80   Resp: 18 18 20    Temp: 98.2 F (36.8 C) 98.2 F (36.8 C) 97.8 F (36.6 C)   TempSrc: Oral Oral Oral   SpO2: 97% 97% 100%   Weight:      Height:       Weight change:   Intake/Output Summary (Last 24 hours) at 09/08/15 1203 Last data filed at 09/08/15 0420  Gross per 24 hour  Intake              120 ml  Output              900 ml  Net             -780 ml    General: Alert, awake, oriented x3, in no acute distress Neck:  JVP is normal Heart: Irregular rate and rhythm, without murmurs, rubs, gallops.  Lungs: Clear to auscultation.  No rales or wheezes. Exemities:  No edema.   Neuro: Grossly intact, nonfocal.  Tele:  Afib   Lab Results: Results for orders placed or performed during the hospital encounter of 09/07/15 (from the past 24 hour(s))  Troponin I-serum (0, 3, 6 hours)     Status: None   Collection Time: 09/07/15  2:26 PM  Result Value Ref Range   Troponin I <0.03 <0.03 ng/mL  Troponin I-serum (0, 3, 6 hours)     Status: None   Collection Time: 09/07/15  2:26 PM  Result Value Ref Range   Troponin I <0.03 <0.03 ng/mL  Heparin level (unfractionated)     Status: None   Collection Time: 09/07/15  7:11 PM  Result Value Ref Range   Heparin Unfractionated 0.38 0.30 - 0.70 IU/mL  CBC     Status: Abnormal   Collection Time: 09/08/15  4:42 AM  Result Value Ref Range   WBC 5.2 4.0 - 10.5 K/uL   RBC 4.18 (L) 4.22 - 5.81 MIL/uL   Hemoglobin 13.3 13.0 - 17.0 g/dL   HCT 16.138.7 (L) 09.639.0 - 04.552.0 %   MCV 92.6 78.0 - 100.0 fL   MCH 31.8 26.0 - 34.0 pg   MCHC 34.4 30.0 - 36.0 g/dL   RDW 40.912.9 81.111.5 - 91.415.5 %   Platelets 341 150 - 400 K/uL  Basic metabolic panel     Status: Abnormal   Collection Time: 09/08/15  4:42 AM  Result Value Ref Range   Sodium 134 (L) 135 - 145 mmol/L   Potassium 4.2 3.5 - 5.1 mmol/L   Chloride 99 (L) 101 - 111 mmol/L   CO2 27 22 - 32 mmol/L   Glucose, Bld 98 65 - 99 mg/dL   BUN 11 6 - 20 mg/dL   Creatinine, Ser 7.820.98 0.61 - 1.24 mg/dL   Calcium 9.7 8.9 - 95.610.3 mg/dL   GFR calc non Af Amer >60 >60 mL/min   GFR calc Af Amer >60 >60 mL/min   Anion gap 8 5 - 15  Heparin level (unfractionated)     Status: None   Collection Time: 09/08/15  4:45 AM  Result Value Ref Range   Heparin Unfractionated 0.36 0.30 - 0.70 IU/mL    Studies/Results: No results found.  Medications: REviewed    @PROBHOSP @  1  CP  Resolved  Enzymes negative  Echo pending  Continue meds   2.  Afib  Rates controlled  Keep on anticoagulation.    CHADSVASc is 4   No new recomm for now.    LOS: 1 day   Dietrich Patesaula Bora Bost 09/08/2015, 12:03 PM

## 2015-09-08 NOTE — Progress Notes (Signed)
ANTICOAGULATION CONSULT NOTE  Pharmacy Consult for Heparin Indication: atrial fibrillation  No Known Allergies  Patient Measurements: Height: 5\' 11"  (180.3 cm) Weight: 126 lb 14.4 oz (57.6 kg) IBW/kg (Calculated) : 75.3 Heparin Dosing Weight: 57 kg  Vital Signs: Temp: 97.8 F (36.6 C) (08/16 0458) Temp Source: Oral (08/16 0458) BP: 115/68 (08/16 0458) Pulse Rate: 80 (08/16 0458)  Labs:  Recent Labs  09/06/15 2318  09/07/15 0529 09/07/15 0958 09/07/15 1426 09/07/15 1911 09/08/15 0442 09/08/15 0445  HGB 13.0  --   --   --   --   --  13.3  --   HCT 37.9*  --   --   --   --   --  38.7*  --   PLT 311  --   --   --   --   --  341  --   HEPARINUNFRC  --   --   --  0.43  --  0.38  --  0.36  CREATININE 1.08  --   --   --   --   --  0.98  --   TROPONINI  --   < > <0.03 <0.03 <0.03  <0.03  --   --   --   < > = values in this interval not displayed.  Estimated Creatinine Clearance: 53.9 mL/min (by C-G formula based on SCr of 0.98 mg/dL).   Assessment: 75 y.o. M presents with CP. Pt found to be in afib, continuing on heparin. No AC PTA. Noted pt with h/o CVA May 2017.  HL remains therapeutic on 800 units/h. CBC wnl - no bleeding documented.  Goal of Therapy:  Heparin level 0.3-0.7 units/ml Monitor platelets by anticoagulation protocol: Yes   Plan:  Continue Heparin gtt at 800 units/hr Daily heparin level and CBC F/u plans for oral AC medication - considering DOAC  Babs BertinHaley Svara Twyman, PharmD, [facility] Clinical Pharmacist Pager [phone] 09/08/2015 8:32 AM

## 2015-09-08 NOTE — Progress Notes (Signed)
PROGRESS NOTE                                                                                                                                                                                                             Patient Demographics:    Calvin Diaz, is a 75 y.o. male, DOB - 10-Nov-1940, ZOX:[id number]  Admit date - 09/07/2015   Admitting Physician Alberteen Samhristopher P Danford, MD  Outpatient Primary MD for the patient is Carollee HerterLALONDE,JOHN CHARLES, MD  LOS - 1  Chief Complaint  Patient presents with  . Chest Pain       Brief Narrative   75 y.o. male wit history  for recent stroke, HTN, and possible MI with PCI and possible dementia who presents with chest pain, As well found to be in A. fib with RVR.   Subjective:    Calvin Diaz today has, No headache, No chest pain, No abdominal pain - No Nausea, No new weakness tingling or numbness, No Cough - SOB.    Assessment  & Plan :    Principal Problem:   Chest pain, unspecified Active Problems:   History of CVA with residual deficit   Essential hypertension   Atrial fibrillation (HCC)   Alcohol use disorder (HCC)   Dementia  Chest pain - Cardiology appreciated, troponin negative 3, EKG with new onset A. Fib. - Denies any chest pain or shortness of breath - [facility] - 2-D echo with EF 60-65%, no regional wall motion abnormalities.  Atrial fibrillation: - CHADS2Vasc 4 (age, HTN, previous CVA), possibly 5 if he really had a previous MI. - TSH within normal limits - Heart rate controlled - Currently on heparin GTT, very likely will need in all before meals echo  HTN and recent stroke:  - Continueamlodipine  History of CVA - Currently on heparin GTT, likely will need NOAC, cont with statin  Dementia No collateral available.   - Hold memantine for now  GERD:  - Hold famotidine for now     Code Status : Full  Family Communication  : Tried  to call brother, was unable to the voicemail  Disposition Plan  : home when stable  Consults  :  cardiology  Procedures  : none  DVT Prophylaxis  :  On heparin gtt  Lab Results  Component Value  Date   PLT 341 09/08/2015    Antibiotics  :    Anti-infectives    None        Objective:   Vitals:   09/07/15 1319 09/07/15 1740 09/07/15 1900 09/08/15 0458  BP: (!) 98/57 98/63 111/63 115/68  Pulse: (!) 58 77 78 80  Resp: 18 18 18 20   Temp: 98.4 F (36.9 C) 98.2 F (36.8 C) 98.2 F (36.8 C) 97.8 F (36.6 C)  TempSrc: Oral Oral Oral Oral  SpO2: 100% 97% 97% 100%  Weight:      Height:        Wt Readings from Last 3 Encounters:  09/07/15 57.6 kg (126 lb 14.4 oz)  08/18/15 56.9 kg (125 lb 6.4 oz)     Intake/Output Summary (Last 24 hours) at 09/08/15 1119 Last data filed at 09/08/15 0420  Gross per 24 hour  Intake              120 ml  Output              900 ml  Net             -780 ml     Physical Exam  Awake Alert Supple Neck,No JVD,  Symmetrical Chest wall movement, Good air movement bilaterally, CTAB Irr Irr,No Gallops,Rubs or new Murmurs, No Parasternal Heave +ve B.Sounds, Abd Soft, No tenderness,  No Cyanosis, Clubbing or edema, No new Rash or bruise      Data Review:    CBC  Recent Labs Lab 09/06/15 2318 09/08/15 0442  WBC 4.7 5.2  HGB 13.0 13.3  HCT 37.9* 38.7*  PLT 311 341  MCV 93.3 92.6  MCH 32.0 31.8  MCHC 34.3 34.4  RDW 12.9 12.9    Chemistries   Recent Labs Lab 09/06/15 2318 09/07/15 0300 09/08/15 0442  NA 132*  --  134*  K 4.3  --  4.2  CL 101  --  99*  CO2 26  --  27  GLUCOSE 96  --  98  BUN 21*  --  11  CREATININE 1.08  --  0.98  CALCIUM 9.3  --  9.7  MG  --  2.0  --    ------------------------------------------------------------------------------------------------------------------ No results for input(s): CHOL, HDL, LDLCALC, TRIG, CHOLHDL, LDLDIRECT in the last 72 hours.  No results found for:  HGBA1C ------------------------------------------------------------------------------------------------------------------  Recent Labs  09/07/15 0300  TSH 0.639   ------------------------------------------------------------------------------------------------------------------ No results for input(s): VITAMINB12, FOLATE, FERRITIN, TIBC, IRON, RETICCTPCT in the last 72 hours.  Coagulation profile No results for input(s): INR, PROTIME in the last 168 hours.  No results for input(s): DDIMER in the last 72 hours.  Cardiac Enzymes  Recent Labs Lab 09/07/15 0529 09/07/15 0958 09/07/15 1426  TROPONINI <0.03 <0.03 <0.03  <0.03   ------------------------------------------------------------------------------------------------------------------ No results found for: BNP  Inpatient Medications  Scheduled Meds: . amLODipine  10 mg Oral Daily  . aspirin  81 mg Oral Daily  . pravastatin  80 mg Oral QHS   Continuous Infusions: . heparin 800 Units/hr (09/07/15 2237)   PRN Meds:.acetaminophen, gi cocktail, ondansetron (ZOFRAN) IV  Micro Results No results found for this or any previous visit (from the past 240 hour(s)).  Radiology Reports Dg Chest 2 View  Result Date: 09/06/2015 CLINICAL DATA:  Acute onset of central chest pain. Nausea. Initial encounter. EXAM: CHEST  2 VIEW COMPARISON:  None. FINDINGS: The lungs are hyperexpanded, with flattening of the hemidiaphragms, compatible with COPD. Mild vascular congestion is noted.  There is no evidence of pleural effusion or pneumothorax. The heart is normal in size; the mediastinal contour is within normal limits. No acute osseous abnormalities are seen. IMPRESSION: Mild vascular congestion noted.  Findings of COPD. Electronically Signed   By: Roanna Raider M.D.   On: 09/06/2015 23:42     Randol Kern, Zeph Riebel M.D on 09/08/2015 at 11:19 AM  Between 7am to 7pm - Pager - [phone]  After 7pm go to [email or web address] - password [facility]  [facility] -  Office  [phone]

## 2015-09-08 NOTE — Progress Notes (Signed)
  Echocardiogram 2D Echocardiogram has been performed.  Calvin Diaz 09/08/2015, 9:07 AM

## 2015-09-09 DIAGNOSIS — I10 Essential (primary) hypertension: Secondary | ICD-10-CM

## 2015-09-09 LAB — CBC
HEMATOCRIT: 37.9 % — AB (ref 39.0–52.0)
Hemoglobin: 13 g/dL (ref 13.0–17.0)
MCH: 31.3 pg (ref 26.0–34.0)
MCHC: 34.3 g/dL (ref 30.0–36.0)
MCV: 91.3 fL (ref 78.0–100.0)
PLATELETS: 333 10*3/uL (ref 150–400)
RBC: 4.15 MIL/uL — ABNORMAL LOW (ref 4.22–5.81)
RDW: 12.7 % (ref 11.5–15.5)
WBC: 5.5 10*3/uL (ref 4.0–10.5)

## 2015-09-09 LAB — BASIC METABOLIC PANEL
Anion gap: 8 (ref 5–15)
BUN: 9 mg/dL (ref 6–20)
CALCIUM: 9.4 mg/dL (ref 8.9–10.3)
CO2: 26 mmol/L (ref 22–32)
CREATININE: 0.84 mg/dL (ref 0.61–1.24)
Chloride: 99 mmol/L — ABNORMAL LOW (ref 101–111)
GLUCOSE: 107 mg/dL — AB (ref 65–99)
Potassium: 3.7 mmol/L (ref 3.5–5.1)
Sodium: 133 mmol/L — ABNORMAL LOW (ref 135–145)

## 2015-09-09 LAB — HEPARIN LEVEL (UNFRACTIONATED): HEPARIN UNFRACTIONATED: 0.3 [IU]/mL (ref 0.30–0.70)

## 2015-09-09 MED ORDER — RIVAROXABAN 20 MG PO TABS: 20.0000 mg | ORAL_TABLET | Freq: Every day | ORAL | 0 refills | Status: DC

## 2015-09-09 MED ORDER — RIVAROXABAN 20 MG PO TABS
20.0000 mg | ORAL_TABLET | Freq: Every day | ORAL | Status: DC
  Administered 2015-09-09: 20 mg via ORAL
  Filled 2015-09-09: qty 1

## 2015-09-09 NOTE — Discharge Summary (Signed)
Calvin Diaz, is a 75 y.o. male  DOB 06/13/1940  MRN [id number].  Admission date:  09/07/2015  Admitting Physician  Alberteen Samhristopher P Danford, MD  Discharge Date:  09/09/2015   Primary MD  Carollee HerterLALONDE,JOHN CHARLES, MD  Recommendations for primary care physician for things to follow:  - please check CBC, BMP during next visit - Shin to continue follow with cardiology, appointment scheduled in 2 weeks   Admission Diagnosis  Chest pain, unspecified chest pain type [R07.9] Atrial fibrillation, unspecified type [facility](HCC) [I48.91]   Discharge Diagnosis  Chest pain, unspecified chest pain type [R07.9] Atrial fibrillation, unspecified type (HCC) [I48.91]    Principal Problem:   Chest pain, unspecified Active Problems:   History of CVA with residual deficit   Essential hypertension   Atrial fibrillation (HCC)   Alcohol use disorder (HCC)   Dementia      Past Medical History:  Diagnosis Date  . Dementia   . High cholesterol   . Hypertension   . Myocardial infarction (HCC)   . Stroke [facility](HCC)     Past Surgical History:  Procedure Laterality Date  . CORONARY ANGIOPLASTY         History of present illness and  Hospital Course:     Kindly see H&P for history of present illness and admission details, please review complete Labs, Consult reports and Test reports for all details in brief  HPI  from the history and physical done on the day of admission 09/07/2015  HPI: Calvin Diaz ("Theerss") is a 75 y.o. male with a past medical history significant for recent stroke, HTN, and possible MI with PCI and possible dementia who presents with chest pain.  History is collected from the patient who is a poor historian.  He reports that he was planning to [facility] from tonight, when he had sudden onset of severe squeezing chest pain around 8PM tonight.  This was not exertional, and not associated  with nausea, diaphoresis, dyspnea and resolved after about an hour by itself.   The patient recently had a stroke 1 month ago at [facility] in [place], he believes, was discharged to rehab, and from there [place] to [place] to stay with his brother.  He denies history of atrial fibrillation.  ED course: -Afebrile, heart rate and respirations normal, blood pressure low normal and SpO2 normal -Initial ECG showed rate controlled atrial fibrillation and troponin was negative. -Na 132, K 4.3, Cr 1.1 (baseline 1.0 three weeks ago), WBC 4.7, Hgb 13 -CXR clear -Records from his previous hospitalizations in [place], [place] were not immediately available -[facility] was asked to admit for observation, serial troponins and risk stratification.      Hospital Course   75 y.o.malewit history  for recent stroke, HTN, who presents with chest pain, As well found to be in A. fib with RVR.  Chest pain - Cardiology appreciated, troponin negative 3, EKG with new onset A. Fib. - Denies any chest pain or shortness of breath - 2-D echo with  EF 60-65%, no regional wall motion abnormalities. - Discussed with family including wife and brother at bedside, they report no history of coronary artery disease, no previous cardiac cath or stenting required as well no history of previous MI.  Atrial fibrillation: - CHADS2Vasc 4 (age, HTN, previous CVA), - TSH within normal limits - Heart rate controlled - Transitioned to Xarelto - To start following with cardiology as an outpatient  HTN and recent stroke: - Continue amlodipine  History of CVA - With known history of A. fib, started on Xarelto, stopped aspirin  Dementia - Continue with home medication  GERD: - Continue with home medication    Discharge Condition:  Stable Discussed with wife at bedside  Follow UP  Follow-up Information    Cline CrockKathryn Thompson, PA-C Follow up on 09/22/2015.   Specialties:  Cardiology,  Radiology Why:  9:30am for your hospital follow up with Dr. Charlott Rakesoss's PA Florentina AddisonKatie. Contact information: [address] [place] [address] [phone]        Carollee HerterLALONDE,JOHN CHARLES, MD Follow up in 1 week(s).   Specialty:  Family Medicine Contact information: [address] [place] [address] [phone]             Discharge Instructions  and  Discharge Medications     Discharge Instructions    Diet - low sodium heart healthy    Complete by:  As directed   Discharge instructions    Complete by:  As directed   Follow with Primary MD Carollee HerterLALONDE,JOHN CHARLES, MD in 7 days   Get CBC, CMP,  checked  by Primary MD next visit.    Activity: As tolerated with Full fall precautions use walker/cane & assistance as needed   Disposition Home    Diet: Heart Healthy  , with feeding assistance and aspiration precautions.  For Heart failure patients - Check your Weight same time everyday, if you gain over 2 pounds, or you develop in leg swelling, experience more shortness of breath or chest pain, call your Primary MD immediately. Follow Cardiac Low Salt Diet and 1.5 lit/day fluid restriction.   On your next visit with your primary care physician please Get Medicines reviewed and adjusted.   Please request your Prim.MD to go over all Hospital Tests and Procedure/Radiological results at the follow up, please get all Hospital records sent to your Prim MD by signing hospital release before you go home.   If you experience worsening of your admission symptoms, develop shortness of breath, life threatening emergency, suicidal or homicidal thoughts you must seek medical attention immediately by calling 911 or calling your MD immediately  if symptoms less severe.  You Must read complete instructions/literature along with all the possible adverse reactions/side effects for all the Medicines you take and that have been prescribed to you. Take any new Medicines after you  have completely understood and accpet all the possible adverse reactions/side effects.   Do not drive, operating heavy machinery, perform activities at heights, swimming or participation in water activities or provide baby sitting services if your were admitted for syncope or siezures until you have seen by Primary MD or a Neurologist and advised to do so again.  Do not drive when taking Pain medications.    Do not take more than prescribed Pain, Sleep and Anxiety Medications  Special Instructions: If you have smoked or chewed Tobacco  in the last 2 yrs please stop smoking, stop any regular Alcohol  and or any Recreational drug use.  Wear Seat  belts while driving.   Please note  You were cared for by a hospitalist during your hospital stay. If you have any questions about your discharge medications or the care you received while you were in the hospital after you are discharged, you can call the unit and asked to speak with the hospitalist on call if the hospitalist that took care of you is not available. Once you are discharged, your primary care physician will handle any further medical issues. Please note that NO REFILLS for any discharge medications will be authorized once you are discharged, as it is imperative that you return to your primary care physician (or establish a relationship with a primary care physician if you do not have one) for your aftercare needs so that they can reassess your need for medications and monitor your lab values.   Increase activity slowly    Complete by:  As directed       Medication List    STOP taking these medications   aspirin 81 MG tablet     TAKE these medications   amLODipine 10 MG tablet Commonly known as:  NORVASC Take 10 mg by mouth daily.   CORICIDIN HBP COUGH/COLD PO Take 1 tablet by mouth every 12 (twelve) hours as needed (cough/congestion).   folic acid 1 MG tablet Commonly known as:  FOLVITE Take 1 mg by mouth daily.    memantine 5 MG tablet Commonly known as:  NAMENDA Take 10 mg by mouth daily.   multivitamin with minerals Tabs tablet Take 1 tablet by mouth daily.   Potassium Chloride ER 20 MEQ Tbcr Take 20 mEq by mouth daily.   pravastatin 80 MG tablet Commonly known as:  PRAVACHOL Take 80 mg by mouth at bedtime.   rivaroxaban 20 MG Tabs tablet Commonly known as:  XARELTO Take 1 tablet (20 mg total) by mouth daily with supper.         Diet and Activity recommendation: See Discharge Instructions above   Consults obtained - Cardiology   Major procedures and Radiology Reports - PLEASE review detailed and final reports for all details, in brief -      Dg Chest 2 View  Result Date: 09/06/2015 CLINICAL DATA:  Acute onset of central chest pain. Nausea. Initial encounter. EXAM: CHEST  2 VIEW COMPARISON:  None. FINDINGS: The lungs are hyperexpanded, with flattening of the hemidiaphragms, compatible with COPD. Mild vascular congestion is noted. There is no evidence of pleural effusion or pneumothorax. The heart is normal in size; the mediastinal contour is within normal limits. No acute osseous abnormalities are seen. IMPRESSION: Mild vascular congestion noted.  Findings of COPD. Electronically Signed   By: Roanna Raider M.D.   On: 09/06/2015 23:42    Micro Results     No results found for this or any previous visit (from the past 240 hour(s)).     Today   Subjective:   Calvin Diaz today has no headache,no chest abdominal pain,no new weakness tingling or numbness, feels much better wants to go home today.   Objective:   Blood pressure 110/68, pulse 62, temperature 98 F (36.7 C), temperature source Oral, resp. rate 18, height 5\' 11"  (1.803 m), weight 57.6 kg (126 lb 14.4 oz), SpO2 (!) 65 %.   Intake/Output Summary (Last 24 hours) at 09/09/15 1115 Last data filed at 09/09/15 0902  Gross per 24 hour  Intake              600 ml  Output  300 ml  Net               300 ml    Exam Awake Alert, Oriented x 3, No new F.N deficits, Normal affect [place].AT,PERRAL Supple Neck,No JVD, No cervical lymphadenopathy appriciated.  Symmetrical Chest wall movement, Good air movement bilaterally, CTAB Irr Irr,No Gallops,Rubs or new Murmurs, No Parasternal Heave +ve B.Sounds, Abd Soft, Non tender, No organomegaly appriciated, No rebound -guarding or rigidity. No Cyanosis, Clubbing or edema, No new Rash or bruise  Data Review   CBC w Diff: Lab Results  Component Value Date   WBC 5.5 09/09/2015   HGB 13.0 09/09/2015   HCT 37.9 (L) 09/09/2015   PLT 333 09/09/2015   LYMPHOPCT 38 08/18/2015   MONOPCT 13 08/18/2015   EOSPCT 2 08/18/2015   BASOPCT 0 08/18/2015    CMP: Lab Results  Component Value Date   NA 133 (L) 09/09/2015   K 3.7 09/09/2015   CL 99 (L) 09/09/2015   CO2 26 09/09/2015   BUN 9 09/09/2015   CREATININE 0.84 09/09/2015   CREATININE 0.98 08/18/2015   PROT 7.5 08/18/2015   ALBUMIN 3.8 08/18/2015   BILITOT 0.4 08/18/2015   ALKPHOS 52 08/18/2015   AST 16 08/18/2015   ALT 14 08/18/2015  .   Total Time in preparing paper work, data evaluation and todays exam - 35 minutes  Cabot Cromartie M.D on 09/09/2015 at 11:15 AM  [facility]   Office  [phone]

## 2015-09-09 NOTE — Progress Notes (Signed)
Patient Name: Calvin Diaz Date of Encounter: 09/09/2015  Hospital Problem List     Principal Problem:   Chest pain, unspecified Active Problems:   History of CVA with residual deficit   Essential hypertension   Atrial fibrillation (HCC)   Alcohol use disorder (HCC)   Dementia    Subjective   Feeling well, no cp. Breathing ok. Wants to go home today.   Inpatient Medications    . amLODipine  10 mg Oral Daily  . aspirin  81 mg Oral Daily  . pravastatin  80 mg Oral QHS    Vital Signs    Vitals:   09/08/15 1143 09/08/15 1312 09/08/15 2011 09/09/15 0646  BP: 109/66 (!) 108/59 (!) 110/58 105/64  Pulse:  81 78 62  Resp:  19 20 18   Temp:  98.6 F (37 C) 98 F (36.7 C) 98 F (36.7 C)  TempSrc:  Oral Oral Oral  SpO2:  100% 100% (!) 65%  Weight:      Height:        Intake/Output Summary (Last 24 hours) at 09/09/15 0803 Last data filed at 09/09/15 0500  Gross per 24 hour  Intake              720 ml  Output              300 ml  Net              420 ml   Filed Weights   09/06/15 2311 09/07/15 0254  Weight: 125 lb (56.7 kg) 126 lb 14.4 oz (57.6 kg)    Physical Exam    General: Pleasant, NAD. Neuro: Alert and oriented X 3. Moves all extremities spontaneously. Psych: Normal affect. HEENT:  Normal  Neck: Supple without bruits or JVD. Lungs:  Resp regular and unlabored, CTA. Heart: Irregularly irregular. no s3, s4, or murmurs. Abdomen: Soft, non-tender, non-distended, BS + x 4.  Extremities: No clubbing, cyanosis or edema. DP/PT/Radials 2+ and equal bilaterally.  Labs    CBC  Recent Labs  09/08/15 0442 09/09/15 0315  WBC 5.2 5.5  HGB 13.3 13.0  HCT 38.7* 37.9*  MCV 92.6 91.3  PLT 341 333   Basic Metabolic Panel  Recent Labs  09/07/15 0300 09/08/15 0442 09/09/15 0315  NA  --  134* 133*  K  --  4.2 3.7  CL  --  99* 99*  CO2  --  27 26  GLUCOSE  --  98 107*  BUN  --  11 9  CREATININE  --  0.98 0.84  CALCIUM  --  9.7 9.4  MG 2.0  --   --      Liver Function Tests No results for input(s): AST, ALT, ALKPHOS, BILITOT, PROT, ALBUMIN in the last 72 hours. No results for input(s): LIPASE, AMYLASE in the last 72 hours. Cardiac Enzymes  Recent Labs  09/07/15 0529 09/07/15 0958 09/07/15 1426  TROPONINI <0.03 <0.03 <0.03  <0.03   BNP Invalid input(s): POCBNP D-Dimer No results for input(s): DDIMER in the last 72 hours. Hemoglobin A1C No results for input(s): HGBA1C in the last 72 hours. Fasting Lipid Panel No results for input(s): CHOL, HDL, LDLCALC, TRIG, CHOLHDL, LDLDIRECT in the last 72 hours. Thyroid Function Tests  Recent Labs  09/07/15 0300  TSH 0.639    Telemetry    Afib  ECG    No recent  Radiology    TEE: 09/08/2015  Study Conclusions  - Left ventricle: The cavity size was  normal. Systolic function was   normal. The estimated ejection fraction was in the range of 60%   to 65%. Wall motion was normal; there were no regional wall   motion abnormalities. The study is not technically sufficient to   allow evaluation of LV diastolic function. - Aortic valve: Nodular calcification of left and non coronary   cusps - Mitral valve: Calcified annulus. - Right atrium: The atrium was mildly dilated. - Atrial septum: No defect or patent foramen ovale was identified.   Assessment & Plan    75 yo male with PMH of HTN/HLD/Dementia and recent Stroke who [facility] with reports of centralized chest pain. Found to be in new onset a-fib with rate in the 60s.  1. Chest pain: Admitted with reports of chest pain while watching TV on 09/06/2015. Trops cycled and neg. EKG did show new onset a-fib, but rate controlled.  -- 2D echo showed normal EF with no WMA  2. A-fib: Rate remains controlled, has been on heparin gtt for anticoagulation until 2D echo completed with results. Given his normal echo, [facility]. Briefly discussed Eliquis with the patient and family on admission. Cr  stable. Not currently on BB, HR has been in the 60s. -- This patients CHA2DS2-VASc Score and unadjusted Ischemic Stroke Rate (% per year) is equal to 4.8 % stroke rate/year from a score of 4 Above score calculated as 1 point each if present [CHF, HTN, DM, Vascular=MI/PAD/Aortic Plaque, Age if 65-74, or Male] Above score calculated as 2 points each if present [Age > 75, or Stroke/TIA/TE] -- Discussed with Dr. Mayford Knifeurner and will transition to Eliquis 5mg  BID dosing today. Given he has remained rate controlled and hemodynamically stable, he could follow up in the outpatient setting regarding further treatment of his a-fib. I have arranged for follow up in the office in 2 weeks.   *addendum- Talked with Pharmacy, Xarelto is going to be a more affordable option for him at this time. Will proceed with Xarelto dosing.   Signed, Laverda PageLindsay Roberts NP-C Pager [phone]

## 2015-09-09 NOTE — Care Management Note (Signed)
Case Management Note Donn PieriniKristi Keyshawna Prouse RN, BSN Unit 2W-Case Manager [phone]  Patient Details  Name: Calvin HawkingJames Diaz MRN: [id number] Date of Birth: 20-Dec-1940  Subjective/Objective:     Pt admitted with afib/chest pain               Action/Plan: PTA pt lived at home with wife and pt's  brother- ([facility] [place] [facility] [place] [facility] [place])- per brother- pt active with HH services- [facility]- RN/PT/OT/ST/aide- will need resumption orders for discharge- received referral for insurance coverage of Noacs- per brother pt recently got part D coverage with [employer]- ID #[id number]- insurance check submitted for Noacs- CM to f/u on what drug is selected.   Expected Discharge Date:     09/09/15             Expected Discharge Plan:  Home w Home Health Services  In-House Referral:     Discharge planning Services  CM Consult, Medication Assistance  Post Acute Care Choice:  Home Health, Resumption of Svcs/PTA Provider Choice offered to:  Patient, Sibling  DME Arranged:    DME Agency:     HH Arranged:  PT, OT, Speech Therapy, Social Work HH Agency:  [facility] (now [facility])  Status of Service:  Completed, signed off  If discussed at [employer] Meetings, dates discussed:    Additional Comments:  09/09/15- 1130- Donn PieriniKristi Charlissa Petros RN, CM- pt for d/c home today- has been started on Xarelto- per insurance check copay will be $47/mo- spoke with pt and his wife at bedside- coverage info shared and 30 day free card given to them to use on discharge. Orders have been placed to resume HH services with [facility]- pt [facility]- have [facility] with  Cor[facility]Mary [facility] regarding pt's discharge for today and [facility].   [facility] RX # [phone] OPT 2   1. XARELTO 20 MG DAILY  ( 30 )  COVER- YES  CO-PAY- $ 47.00  30 TAB  TIER- 3 DRUG  PRIOR APPROVAL - NO    [name], Lenn SinkKristi Hall, RN 09/09/2015, 11:51 AM

## 2015-09-09 NOTE — Progress Notes (Signed)
ANTICOAGULATION CONSULT NOTE  Pharmacy Consult for Heparin Indication: atrial fibrillation  No Known Allergies  Patient Measurements: Height: 5\' 11"  (180.3 cm) Weight: 126 lb 14.4 oz (57.6 kg) IBW/kg (Calculated) : 75.3 Heparin Dosing Weight: 57 kg  Vital Signs: Temp: 98 F (36.7 C) (08/17 0646) Temp Source: Oral (08/17 0646) BP: 105/64 (08/17 0646) Pulse Rate: 62 (08/17 0646)  Labs:  Recent Labs  09/06/15 2318  09/07/15 0529  09/07/15 0958 09/07/15 1426 09/07/15 1911 09/08/15 0442 09/08/15 0445 09/09/15 0315  HGB 13.0  --   --   --   --   --   --  13.3  --  13.0  HCT 37.9*  --   --   --   --   --   --  38.7*  --  37.9*  PLT 311  --   --   --   --   --   --  341  --  333  HEPARINUNFRC  --   --   --   < > 0.43  --  0.38  --  0.36 0.30  CREATININE 1.08  --   --   --   --   --   --  0.98  --  0.84  TROPONINI  --   < > <0.03  --  <0.03 <0.03  <0.03  --   --   --   --   < > = values in this interval not displayed.  Estimated Creatinine Clearance: 62.9 mL/min (by C-G formula based on SCr of 0.84 mg/dL).   Assessment: 75 y.o. M presents with CP. Pt found to be in afib. Pharmacy now consulted to transition heparin to Xarelto. No AC PTA. Noted pt with h/o CVA May 2017. CBC wnl, renal function wnl, no bleed documented.  Goal of Therapy:  Stroke prevention Monitor platelets by anticoagulation protocol: Yes   Plan:  Turn off heparin drip when 1st dose of Xarelto given - communicated with RN Xarelto 20mg  PO Qsupper Monitor s/sx bleeding, CBC  Babs BertinHaley Nataya Bastedo, PharmD, [facility] Clinical Pharmacist Pager [phone] 09/09/2015 8:37 AM

## 2015-09-09 NOTE — Discharge Instructions (Signed)

## 2015-09-13 ENCOUNTER — Telehealth: Payer: Self-pay

## 2015-09-13 NOTE — Telephone Encounter (Signed)
From [facility]. Placed in your folder for review.

## 2015-09-13 NOTE — Telephone Encounter (Signed)
Doris from [facility] called to say she went out a month ago to check on pt and just went out again the brother who has power of attorney over patient said they had everything they needed she just called to say they were closing the case

## 2015-09-13 NOTE — Telephone Encounter (Signed)
Pt d/c from hospital on Thursday. Audry Piliebra Brown, PT resumed services today and request  2x week for 4 week and 1x week for 2 weeks. Total of 10 visits.   Stanton KidneyDebra states that ST and OT have not resumed services yet, but will resume sometime this week and should call with their requested orders.   Stanton KidneyDebra also request to resume home health aide as well.   Please call Debra back at [phone].

## 2015-09-14 ENCOUNTER — Encounter: Payer: Self-pay | Admitting: Family Medicine

## 2015-09-14 NOTE — Telephone Encounter (Signed)
Go ahead and set this up 

## 2015-09-14 NOTE — Telephone Encounter (Signed)
Left message on machine Dr.Lalonde has okayed this

## 2015-09-15 ENCOUNTER — Telehealth: Payer: Self-pay | Admitting: Family Medicine

## 2015-09-15 MED ORDER — PRAVASTATIN SODIUM 80 MG PO TABS: 80.0000 mg | ORAL_TABLET | Freq: Every day | ORAL | 3 refills | Status: DC

## 2015-09-15 MED ORDER — AMLODIPINE BESYLATE 10 MG PO TABS: 10.0000 mg | ORAL_TABLET | Freq: Every day | ORAL | 3 refills | Status: DC

## 2015-09-15 MED ORDER — MEMANTINE HCL 5 MG PO TABS: 10.0000 mg | ORAL_TABLET | Freq: Every day | ORAL | 3 refills | Status: DC

## 2015-09-15 NOTE — Telephone Encounter (Signed)
Pt's brother came by and stated that his brother has an appt on the 28th but is out of some of his meds. Pt needs refills on memantine , pravastatin and amlodipine. [facility] [address] Please call Chanan at [phone] if any questions.

## 2015-09-20 ENCOUNTER — Ambulatory Visit (INDEPENDENT_AMBULATORY_CARE_PROVIDER_SITE_OTHER): Payer: Medicare Other | Admitting: Family Medicine

## 2015-09-20 ENCOUNTER — Encounter: Payer: Self-pay | Admitting: Family Medicine

## 2015-09-20 ENCOUNTER — Telehealth: Payer: Self-pay | Admitting: Family Medicine

## 2015-09-20 VITALS — BP 100/62 | HR 56 | Ht 71.0 in | Wt 125.0 lb

## 2015-09-20 DIAGNOSIS — F0390 Unspecified dementia without behavioral disturbance: Secondary | ICD-10-CM

## 2015-09-20 DIAGNOSIS — I4819 Other persistent atrial fibrillation: Secondary | ICD-10-CM

## 2015-09-20 DIAGNOSIS — I10 Essential (primary) hypertension: Secondary | ICD-10-CM | POA: Diagnosis not present

## 2015-09-20 DIAGNOSIS — Z23 Encounter for immunization: Secondary | ICD-10-CM

## 2015-09-20 DIAGNOSIS — I481 Persistent atrial fibrillation: Secondary | ICD-10-CM

## 2015-09-20 DIAGNOSIS — Z79899 Other long term (current) drug therapy: Secondary | ICD-10-CM | POA: Diagnosis not present

## 2015-09-20 DIAGNOSIS — R32 Unspecified urinary incontinence: Secondary | ICD-10-CM | POA: Diagnosis not present

## 2015-09-20 DIAGNOSIS — N3281 Overactive bladder: Secondary | ICD-10-CM | POA: Insufficient documentation

## 2015-09-20 LAB — CBC WITH DIFFERENTIAL/PLATELET
BASOS ABS: 57 {cells}/uL (ref 0–200)
Basophils Relative: 1 %
EOS PCT: 2 %
Eosinophils Absolute: 114 cells/uL (ref 15–500)
HCT: 42.2 % (ref 38.5–50.0)
HEMOGLOBIN: 14.3 g/dL (ref 13.2–17.1)
LYMPHS ABS: 2337 {cells}/uL (ref 850–3900)
LYMPHS PCT: 41 %
MCH: 31 pg (ref 27.0–33.0)
MCHC: 33.9 g/dL (ref 32.0–36.0)
MCV: 91.3 fL (ref 80.0–100.0)
MPV: 9.6 fL (ref 7.5–12.5)
Monocytes Absolute: 627 cells/uL (ref 200–950)
Monocytes Relative: 11 %
NEUTROS PCT: 45 %
Neutro Abs: 2565 cells/uL (ref 1500–7800)
Platelets: 371 10*3/uL (ref 140–400)
RBC: 4.62 MIL/uL (ref 4.20–5.80)
RDW: 13.8 % (ref 11.0–15.0)
WBC: 5.7 10*3/uL (ref 4.0–10.5)

## 2015-09-20 LAB — COMPREHENSIVE METABOLIC PANEL
ALBUMIN: 4.2 g/dL (ref 3.6–5.1)
ALT: 12 U/L (ref 9–46)
AST: 16 U/L (ref 10–35)
Alkaline Phosphatase: 48 U/L (ref 40–115)
BUN: 14 mg/dL (ref 7–25)
CALCIUM: 9.8 mg/dL (ref 8.6–10.3)
CHLORIDE: 98 mmol/L (ref 98–110)
CO2: 26 mmol/L (ref 20–31)
Creat: 0.87 mg/dL (ref 0.70–1.18)
GLUCOSE: 75 mg/dL (ref 65–99)
Potassium: 4.4 mmol/L (ref 3.5–5.3)
SODIUM: 137 mmol/L (ref 135–146)
Total Bilirubin: 0.5 mg/dL (ref 0.2–1.2)
Total Protein: 8.1 g/dL (ref 6.1–8.1)

## 2015-09-20 NOTE — Telephone Encounter (Signed)
See msg

## 2015-09-20 NOTE — Telephone Encounter (Signed)
Roderick Peelden Mitchell from [facility]  t#[phone] called stating they completed speech evaluation on pt and no further speech therapy is recommended at this time

## 2015-09-20 NOTE — Progress Notes (Signed)
   Subjective:    Patient ID: Calvin Diaz, male    DOB: 1940-12-26, 75 y.o.   MRN: [id number]  HPI He is here for a recheck. He was recently seen in the hospital and admitted for treatment of chest pain. He does have a history of atrial fibrillation. He was transitioned to Xarelto. He was evaluated by cardiology during the admission as well. Exam of the record also shows a slightly low sodium. He does have underlying dementia and presently apparently ran out of his Namenda. He does have short-term memory issues. His brother is taking care of him and also notes some urinary incontinence. He does use depends and is doing well with that. He is also gained some weight since living with his brother and according his brother is doing fairly well taking care of his ADLs. He has had PT and OT evaluations. Apparently they're about to finish. He presently is on amlodipine and having no trouble with that.   Review of Systems     Objective:   Physical Exam Alert and in no distress. Lungs are clear to auscultation. Cardiac exam shows a slightly irregular rhythm without murmurs or gallops.       Assessment & Plan:  Persistent atrial fibrillation (HCC) - Plan: CBC with Differential/Platelet, Comprehensive metabolic panel  Essential hypertension - Plan: CBC with Differential/Platelet, Comprehensive metabolic panel  Dementia, without behavioral disturbance  Encounter for long-term (current) use of medications - Plan: CBC with Differential/Platelet, Comprehensive metabolic panel  Urinary incontinence, unspecified incontinence type  Need for prophylactic vaccination and inoculation against influenza - Plan: Flu vaccine HIGH DOSE PF (Fluzone High dose) I will follow-up on the blood work to make sure there are no abnormalities. He will otherwise continue on his present medications and get back on the Namenda. Also discussed the dementia with his brother explaining the problem with short and long-term  memory issues. He will return here in approximately 3 months for follow-up visit.

## 2015-09-20 NOTE — Progress Notes (Signed)
Cardiology Office Note    Date:  09/22/2015   ID:  Calvin Diaz, DOB April 17, 1940, MRN [id number]  PCP:  Carollee Herter, MD  Cardiologist:  Dr. Tenny Craw  CC: post hosp follow up.   History of Present Illness:  Calvin Diaz is a 75 y.o. male with a history of HTN, HLD, dementia, CVA, and recently diagnosed PAF who presents to clinic for post hospital follow up.   He previously lived in [place] until about a month ago when he moved here to live with family. Family reports he was in good state of health until about 07/2015 ago when he was admitted to [facility] and diagnosed with a stroke. He was then discharged from there and came here to live with his brother. Brother reports he has been receiving in home health care with PT and speech therapy and has been doing well. The patient and his brother deny any significant cardiac hx including arrhthymias or heart catheterizations. He initially presented to [facility] with sudden onset of centralized chest pain that lasted about 30 minutes and then subsided. His EKG did show new a fib with rate of 69. He was placed on IV heparin for anticoagulation. He ruled out by enzymes for MI. Echo with normal LVF. He was started on Xarelto for increased CHADs2VASC score. HR controlled on no BB. Plan was for 4 weeks anticoagulation prior to DCCV. Consider outpt nuclear stress test to workup CP.   Today he present to clinic for follow up. He has been feeling well since leaving the hospital. No CP or SOB. No LE edema, orthopnea or PND. No dizziness or syncope. He is now living with his younger brother here in [place]. He has been having some short term memory loss    Past Medical History:  Diagnosis Date  . Dementia   . High cholesterol   . Hypertension   . Myocardial infarction (HCC)   . Stroke [facility])     Past Surgical History:  Procedure Laterality Date  . CORONARY ANGIOPLASTY      Current Medications: Outpatient Medications Prior to  Visit  Medication Sig Dispense Refill  . amLODipine (NORVASC) 10 MG tablet Take 1 tablet (10 mg total) by mouth daily. 90 tablet 3  . Chlorpheniramine-DM (CORICIDIN HBP COUGH/COLD PO) Take 1 tablet by mouth every 12 (twelve) hours as needed (cough/congestion).     . memantine (NAMENDA) 5 MG tablet Take 2 tablets (10 mg total) by mouth daily. 90 tablet 3  . Multiple Vitamin (MULTIVITAMIN WITH MINERALS) TABS tablet Take 1 tablet by mouth daily.    . pravastatin (PRAVACHOL) 80 MG tablet Take 1 tablet (80 mg total) by mouth at bedtime. 90 tablet 3  . rivaroxaban (XARELTO) 20 MG TABS tablet Take 1 tablet (20 mg total) by mouth daily with supper. 30 tablet 0  . Potassium Chloride ER 20 MEQ TBCR Take 20 mEq by mouth daily.   0  . folic acid (FOLVITE) 1 MG tablet Take 1 mg by mouth daily.  2   No facility-administered medications prior to visit.      Allergies:   Review of patient's allergies indicates no known allergies.   Social History   Social History  . Marital status: Divorced    Spouse name: N/A  . Number of children: N/A  . Years of education: N/A   Social History Main Topics  . Smoking status: Former Smoker    Types: Cigarettes  . Smokeless tobacco: Former Neurosurgeon  Quit date: 08/18/2015  . Alcohol use No     Comment: Previously daily  . Drug use: No  . Sexual activity: Not Asked   Other Topics Concern  . None   Social History Narrative  . None     Family History:  The patient's family history includes Arrhythmia in his mother; Stroke in his maternal aunt.     ROS:   Please see the history of present illness.    ROS All other systems reviewed and are negative.   PHYSICAL EXAM:   VS:  BP 116/62   Pulse 69   Ht 5\' 11"  (1.803 m)   Wt 127 lb 12.8 oz (58 kg)   BMI 17.82 kg/m    GEN: Well nourished, well developed, in no acute distress  HEENT: normal  Neck: no JVD, carotid bruits, or masses Cardiac: irreg irreg ; no murmurs, rubs, or gallops,no edema  Respiratory:   clear to auscultation bilaterally, normal work of breathing GI: soft, nontender, nondistended, + BS MS: no deformity or atrophy  Skin: warm and dry, no rash Neuro:  Alert and Oriented x 3, Strength and sensation are intact Psych: euthymic mood, full affect  Wt Readings from Last 3 Encounters:  09/22/15 127 lb 12.8 oz (58 kg)  09/20/15 125 lb (56.7 kg)  09/07/15 126 lb 14.4 oz (57.6 kg)      Studies/Labs Reviewed:   EKG:  EKG is ordered today.  The ekg ordered today demonstrates atrial fibrillation with HR 69, anteroseptal Q waves  Recent Labs: 09/07/2015: Magnesium 2.0; TSH 0.639 09/20/2015: ALT 12; BUN 14; Creat 0.87; Hemoglobin 14.3; Platelets 371; Potassium 4.4; Sodium 137   Lipid Panel    Component Value Date/Time   CHOL 127 08/18/2015 1512   TRIG 64 08/18/2015 1512   HDL 57 08/18/2015 1512   CHOLHDL 2.2 08/18/2015 1512   VLDL 13 08/18/2015 1512   LDLCALC 57 08/18/2015 1512    Additional studies/ records that were reviewed today include:  TTE: 09/08/2015 Study Conclusions - Left ventricle: The cavity size was normal. Systolic function was normal. The estimated ejection fraction was in the range of 60% to 65%. Wall motion was normal; there were no regional wall motion abnormalities. The study is not technically sufficient to allow evaluation of LV diastolic function. - Aortic valve: Nodular calcification of left and non coronary cusps - Mitral valve: Calcified annulus. - Right atrium: The atrium was mildly dilated. - Atrial septum: No defect or patent foramen ovale was identified.    ASSESSMENT & PLAN:   Calvin Diaz is a 75 y.o. male with a history of HTN, HLD, dementia, CVA, and recently diagnosed PAF who presents to clinic for post hospital follow up.   Chest pain: Admitted with reports of chest pain while watching TV on 09/06/2015. Trops cycled and neg. EKG did show new onset a-fib, but rate controlled. 2D echo showed normal EF with no WMA. Will  obtain nuclear stress test ro rule out ischemia.  PAF: currently well rate controlled on no AV nodal blocking agents. Started on Xarelto for CHADSVASC score of 4. Plan for DCCV after 4 weeks of uninterrupted Xarelto therapy (started on 09/09/15). I will bring him back to see me and if still in afib will have him cardioverted.   HTN: BP well controlled on current regimen  HLD: continue statin   Hx of CVA: continue Xarelto  Medication Adjustments/Labs and Tests Ordered: Current medicines are reviewed at length with the patient today.  Concerns regarding  medicines are outlined above.  Medication changes, Labs and Tests ordered today are listed in the Patient Instructions below. Patient Instructions  Medication Instructions:  Your physician recommends that you continue on your current medications as directed. Please refer to the Current Medication list given to you today.   Labwork: None ordered  Testing/Procedures: Your physician has requested that you have en exercise stress myoview. For further information please visit [email or web address]. Please follow instruction sheet, as given.   Follow-Up: Your physician recommends that you schedule a follow-up appointment in: 10/12/15 ARRIVING AT 2:15 TO SEE KATIE THOMPSON, PA-C   Any Other Special Instructions Will Be Listed Below (If Applicable).    If you need a refill on your cardiac medications before your next appointment, please call your pharmacy.      Signed, Cline CrockKathryn Thompson, PA-C  09/22/2015 10:43 AM    [facility] [address], [place], [place]  [address] Phone: [phone]; Fax: [phone]

## 2015-09-22 ENCOUNTER — Ambulatory Visit (INDEPENDENT_AMBULATORY_CARE_PROVIDER_SITE_OTHER): Payer: Medicare Other | Admitting: Physician Assistant

## 2015-09-22 ENCOUNTER — Encounter (INDEPENDENT_AMBULATORY_CARE_PROVIDER_SITE_OTHER): Payer: Self-pay

## 2015-09-22 ENCOUNTER — Encounter: Payer: Self-pay | Admitting: Physician Assistant

## 2015-09-22 VITALS — BP 116/62 | HR 69 | Ht 71.0 in | Wt 127.8 lb

## 2015-09-22 DIAGNOSIS — E785 Hyperlipidemia, unspecified: Secondary | ICD-10-CM | POA: Diagnosis not present

## 2015-09-22 DIAGNOSIS — Z8673 Personal history of transient ischemic attack (TIA), and cerebral infarction without residual deficits: Secondary | ICD-10-CM

## 2015-09-22 DIAGNOSIS — R079 Chest pain, unspecified: Secondary | ICD-10-CM | POA: Diagnosis not present

## 2015-09-22 DIAGNOSIS — I4891 Unspecified atrial fibrillation: Secondary | ICD-10-CM

## 2015-09-22 DIAGNOSIS — I10 Essential (primary) hypertension: Secondary | ICD-10-CM

## 2015-09-22 NOTE — Patient Instructions (Addendum)
Medication Instructions:  Your physician recommends that you continue on your current medications as directed. Please refer to the Current Medication list given to you today.   Labwork: None ordered  Testing/Procedures: Your physician has requested that you have en exercise stress myoview. For further information please visit [email or web address]. Please follow instruction sheet, as given.   Follow-Up: Your physician recommends that you schedule a follow-up appointment in: 10/12/15 ARRIVING AT 2:15 TO SEE KATIE THOMPSON, PA-C   Any Other Special Instructions Will Be Listed Below (If Applicable).    If you need a refill on your cardiac medications before your next appointment, please call your pharmacy.

## 2015-09-23 ENCOUNTER — Telehealth: Payer: Self-pay | Admitting: Family Medicine

## 2015-09-23 NOTE — Telephone Encounter (Signed)
Let them know that the best way is to make sure he stays awake during the day. Not let him sleep late in the morning. The idea is to get him on a regular schedule of awake during the day and sleeping at night. They want a can come in and we can discuss it in more detail. Also I wrote a prescription for the pull-ups

## 2015-09-23 NOTE — Telephone Encounter (Signed)
I have informed his brother and he will try it and he will come by and pick up rx for pull ups

## 2015-09-23 NOTE — Telephone Encounter (Signed)
Pt brother come by and states that Fayrene Fearingjames is having eposides of sun downers he is not going to bed till round 5 am, he is having his night and days confused, and was wondering if he could get something to help him sleep or if there is something else they could do to help, and also he was wanting a RX for pull ups for the insurance to help pay for them pt brother can be reached at [phone] with any questions

## 2015-09-24 ENCOUNTER — Encounter: Payer: Self-pay | Admitting: Physician Assistant

## 2015-09-28 ENCOUNTER — Telehealth (HOSPITAL_COMMUNITY): Payer: Self-pay | Admitting: *Deleted

## 2015-09-28 NOTE — Telephone Encounter (Signed)
Left message on voicemail per DPR in reference to upcoming appointment scheduled on  09/29/15 with detailed instructions given per Myocardial Perfusion Study Information Sheet for the test. LM to arrive 15 minutes early, and that it is imperative to arrive on time for appointment to keep from having the test rescheduled. If you need to cancel or reschedule your appointment, please call the office within 24 hours of your appointment. Failure to do so may result in a cancellation of your appointment, and a $50 no show fee. Phone number given for call back for any questions. Ricky AlaSmith, Nyana Haren Jacqueline

## 2015-09-29 ENCOUNTER — Ambulatory Visit (HOSPITAL_COMMUNITY): Payer: Medicare Other | Attending: Internal Medicine

## 2015-09-29 DIAGNOSIS — R9439 Abnormal result of other cardiovascular function study: Secondary | ICD-10-CM | POA: Insufficient documentation

## 2015-09-29 DIAGNOSIS — R079 Chest pain, unspecified: Secondary | ICD-10-CM | POA: Diagnosis not present

## 2015-09-29 DIAGNOSIS — I10 Essential (primary) hypertension: Secondary | ICD-10-CM | POA: Insufficient documentation

## 2015-09-29 DIAGNOSIS — Z8673 Personal history of transient ischemic attack (TIA), and cerebral infarction without residual deficits: Secondary | ICD-10-CM | POA: Insufficient documentation

## 2015-09-29 LAB — MYOCARDIAL PERFUSION IMAGING
CHL CUP RESTING HR STRESS: 62 {beats}/min
CSEPPHR: 82 {beats}/min
LVDIAVOL: 63 mL (ref 62–150)
LVSYSVOL: 16 mL
NUC STRESS TID: 0.98
RATE: 0.24
SDS: 2
SRS: 5
SSS: 7

## 2015-09-29 MED ORDER — REGADENOSON 0.4 MG/5ML IV SOLN
0.4000 mg | Freq: Once | INTRAVENOUS | Status: AC
  Administered 2015-09-29: 0.4 mg via INTRAVENOUS

## 2015-09-29 MED ORDER — TECHNETIUM TC 99M TETROFOSMIN IV KIT
10.1000 | PACK | Freq: Once | INTRAVENOUS | Status: AC | PRN
  Administered 2015-09-29: 10.1 via INTRAVENOUS
  Filled 2015-09-29: qty 10

## 2015-09-29 MED ORDER — TECHNETIUM TC 99M TETROFOSMIN IV KIT
30.0000 | PACK | Freq: Once | INTRAVENOUS | Status: AC | PRN
  Administered 2015-09-29: 30 via INTRAVENOUS
  Filled 2015-09-29: qty 30

## 2015-10-08 ENCOUNTER — Telehealth: Payer: Self-pay

## 2015-10-08 NOTE — Telephone Encounter (Signed)
Calvin SpragueBeverly with [facility] wants to extend care for 3 weeks. PT. OT, and home health aide services. Verbal given ok to extend services. Trixie Rude/RLB

## 2015-10-11 ENCOUNTER — Telehealth: Payer: Self-pay | Admitting: Family Medicine

## 2015-10-11 ENCOUNTER — Other Ambulatory Visit: Payer: Self-pay | Admitting: Medical

## 2015-10-11 MED ORDER — SUVOREXANT 10 MG PO TABS
1.0000 | ORAL_TABLET | Freq: Every day | ORAL | 0 refills | Status: DC
Start: 2015-10-11 — End: 2015-12-21

## 2015-10-11 NOTE — Progress Notes (Signed)
belso

## 2015-10-11 NOTE — Telephone Encounter (Signed)
Calvin Diaz - Call out Belsomra 10mg  daily for sleep aid at bedtime.   F/u 3-4 wk here with Dr. Susann GivensLalonde regarding sleep  Calvin MessierKathy, please text Dr. Susann GivensLalonde to disregard if you notified him.   I wouldn't bother him on vacation for this.

## 2015-10-11 NOTE — Progress Notes (Signed)
  ROS  This encounter was created in error - please disregard. 

## 2015-10-11 NOTE — Telephone Encounter (Signed)
Calvin Diaz with home health called and stated that she needed a  Verbal order to assist this family with education and social services. Spoke with Calvin Diaz and she ok'd those orders.   ALERT PT HAS NEW PHARMACY [facility] [address]. Calvin Diaz also states that the pt is not sleeping at all and his care provider, wife Calvin Diaz,  is unable to get any rest. She is having some issues trying to care for him without any sleep. Calvin Diaz is requesting that something be sent in for pt to assist with sleep. Please advise pt's care provider, Calvin Diaz at [phone]. She is on pt's DPR. Sending to both [employer] and [employer].

## 2015-10-12 ENCOUNTER — Encounter: Payer: Medicare Other | Admitting: Physician Assistant

## 2015-10-12 ENCOUNTER — Encounter: Payer: Self-pay | Admitting: Physician Assistant

## 2015-10-12 NOTE — Patient Instructions (Signed)
Medication Instructions:  Your physician recommends that you continue on your current medications as directed. Please refer to the Current Medication list given to you today.   Labwork: None ordered  Testing/Procedures: None ordered  Follow-Up: Your physician recommends that you schedule a follow-up appointment in:    Any Other Special Instructions Will Be Listed Below (If Applicable).     If you need a refill on your cardiac medications before your next appointment, please call your pharmacy.   

## 2015-10-12 NOTE — Telephone Encounter (Signed)
Pt's spouse, [name], informed rx has been called in for him.

## 2015-10-12 NOTE — Telephone Encounter (Signed)
Called out med to pharmacy 

## 2015-10-13 ENCOUNTER — Telehealth: Payer: Self-pay | Admitting: Internal Medicine

## 2015-10-13 ENCOUNTER — Encounter: Payer: Self-pay | Admitting: Physician Assistant

## 2015-10-13 ENCOUNTER — Telehealth: Payer: Self-pay | Admitting: Medical

## 2015-10-13 NOTE — Telephone Encounter (Signed)
Pt's wife, [name], called. She stated that she went to pick up the sleep medication that was called in and her out of pocket was going to be $158.00. She states they can not afford that. She is requesting something less expensive be sent in for him. She can be reached at [phone].

## 2015-10-13 NOTE — Telephone Encounter (Signed)
Called med into [facility]

## 2015-10-14 DIAGNOSIS — E785 Hyperlipidemia, unspecified: Secondary | ICD-10-CM | POA: Diagnosis not present

## 2015-10-14 DIAGNOSIS — I2510 Atherosclerotic heart disease of native coronary artery without angina pectoris: Secondary | ICD-10-CM | POA: Diagnosis not present

## 2015-10-14 DIAGNOSIS — I10 Essential (primary) hypertension: Secondary | ICD-10-CM | POA: Diagnosis not present

## 2015-10-14 DIAGNOSIS — F0390 Unspecified dementia without behavioral disturbance: Secondary | ICD-10-CM | POA: Diagnosis not present

## 2015-10-14 DIAGNOSIS — I69354 Hemiplegia and hemiparesis following cerebral infarction affecting left non-dominant side: Secondary | ICD-10-CM | POA: Diagnosis not present

## 2015-10-14 DIAGNOSIS — I4891 Unspecified atrial fibrillation: Secondary | ICD-10-CM | POA: Diagnosis not present

## 2015-10-14 NOTE — Telephone Encounter (Signed)
What has he been on prior, Ambien, Trazaodone, Xanax, Benadryl other OTC sleep aids?  This should be able to get approved.

## 2015-10-15 DIAGNOSIS — I69354 Hemiplegia and hemiparesis following cerebral infarction affecting left non-dominant side: Secondary | ICD-10-CM | POA: Diagnosis not present

## 2015-10-15 DIAGNOSIS — I10 Essential (primary) hypertension: Secondary | ICD-10-CM | POA: Diagnosis not present

## 2015-10-15 DIAGNOSIS — E785 Hyperlipidemia, unspecified: Secondary | ICD-10-CM | POA: Diagnosis not present

## 2015-10-15 DIAGNOSIS — F0390 Unspecified dementia without behavioral disturbance: Secondary | ICD-10-CM | POA: Diagnosis not present

## 2015-10-15 DIAGNOSIS — I2510 Atherosclerotic heart disease of native coronary artery without angina pectoris: Secondary | ICD-10-CM | POA: Diagnosis not present

## 2015-10-15 DIAGNOSIS — I4891 Unspecified atrial fibrillation: Secondary | ICD-10-CM | POA: Diagnosis not present

## 2015-10-18 DIAGNOSIS — E785 Hyperlipidemia, unspecified: Secondary | ICD-10-CM | POA: Diagnosis not present

## 2015-10-18 DIAGNOSIS — I69354 Hemiplegia and hemiparesis following cerebral infarction affecting left non-dominant side: Secondary | ICD-10-CM | POA: Diagnosis not present

## 2015-10-18 DIAGNOSIS — I10 Essential (primary) hypertension: Secondary | ICD-10-CM | POA: Diagnosis not present

## 2015-10-18 DIAGNOSIS — F0390 Unspecified dementia without behavioral disturbance: Secondary | ICD-10-CM | POA: Diagnosis not present

## 2015-10-18 DIAGNOSIS — I2510 Atherosclerotic heart disease of native coronary artery without angina pectoris: Secondary | ICD-10-CM | POA: Diagnosis not present

## 2015-10-18 DIAGNOSIS — I4891 Unspecified atrial fibrillation: Secondary | ICD-10-CM | POA: Diagnosis not present

## 2015-10-19 ENCOUNTER — Telehealth: Payer: Self-pay | Admitting: Family Medicine

## 2015-10-19 NOTE — Telephone Encounter (Signed)
Received P.A. Marcellus ScottBELSOMRA, need diagnosis to complete

## 2015-10-20 NOTE — Telephone Encounter (Signed)
There should be a diagnosis in the chart but has not put insomnia

## 2015-10-21 DIAGNOSIS — I2510 Atherosclerotic heart disease of native coronary artery without angina pectoris: Secondary | ICD-10-CM | POA: Diagnosis not present

## 2015-10-21 DIAGNOSIS — I4891 Unspecified atrial fibrillation: Secondary | ICD-10-CM | POA: Diagnosis not present

## 2015-10-21 DIAGNOSIS — I69354 Hemiplegia and hemiparesis following cerebral infarction affecting left non-dominant side: Secondary | ICD-10-CM | POA: Diagnosis not present

## 2015-10-21 DIAGNOSIS — I10 Essential (primary) hypertension: Secondary | ICD-10-CM | POA: Diagnosis not present

## 2015-10-21 DIAGNOSIS — F0390 Unspecified dementia without behavioral disturbance: Secondary | ICD-10-CM | POA: Diagnosis not present

## 2015-10-21 DIAGNOSIS — E785 Hyperlipidemia, unspecified: Secondary | ICD-10-CM | POA: Diagnosis not present

## 2015-10-22 DIAGNOSIS — F0390 Unspecified dementia without behavioral disturbance: Secondary | ICD-10-CM | POA: Diagnosis not present

## 2015-10-22 DIAGNOSIS — I2510 Atherosclerotic heart disease of native coronary artery without angina pectoris: Secondary | ICD-10-CM | POA: Diagnosis not present

## 2015-10-22 DIAGNOSIS — E785 Hyperlipidemia, unspecified: Secondary | ICD-10-CM | POA: Diagnosis not present

## 2015-10-22 DIAGNOSIS — I69354 Hemiplegia and hemiparesis following cerebral infarction affecting left non-dominant side: Secondary | ICD-10-CM | POA: Diagnosis not present

## 2015-10-22 DIAGNOSIS — I4891 Unspecified atrial fibrillation: Secondary | ICD-10-CM | POA: Diagnosis not present

## 2015-10-22 DIAGNOSIS — I10 Essential (primary) hypertension: Secondary | ICD-10-CM | POA: Diagnosis not present

## 2015-10-23 NOTE — Telephone Encounter (Signed)
Form completed with insomnia diagnosis

## 2015-10-25 DIAGNOSIS — F0390 Unspecified dementia without behavioral disturbance: Secondary | ICD-10-CM | POA: Diagnosis not present

## 2015-10-25 DIAGNOSIS — I10 Essential (primary) hypertension: Secondary | ICD-10-CM | POA: Diagnosis not present

## 2015-10-25 DIAGNOSIS — E785 Hyperlipidemia, unspecified: Secondary | ICD-10-CM | POA: Diagnosis not present

## 2015-10-25 DIAGNOSIS — I2510 Atherosclerotic heart disease of native coronary artery without angina pectoris: Secondary | ICD-10-CM | POA: Diagnosis not present

## 2015-10-25 DIAGNOSIS — I4891 Unspecified atrial fibrillation: Secondary | ICD-10-CM | POA: Diagnosis not present

## 2015-10-25 DIAGNOSIS — I69354 Hemiplegia and hemiparesis following cerebral infarction affecting left non-dominant side: Secondary | ICD-10-CM | POA: Diagnosis not present

## 2015-10-26 DIAGNOSIS — I4891 Unspecified atrial fibrillation: Secondary | ICD-10-CM | POA: Diagnosis not present

## 2015-10-26 DIAGNOSIS — F0390 Unspecified dementia without behavioral disturbance: Secondary | ICD-10-CM | POA: Diagnosis not present

## 2015-10-26 DIAGNOSIS — I10 Essential (primary) hypertension: Secondary | ICD-10-CM | POA: Diagnosis not present

## 2015-10-26 DIAGNOSIS — E785 Hyperlipidemia, unspecified: Secondary | ICD-10-CM | POA: Diagnosis not present

## 2015-10-26 DIAGNOSIS — I2510 Atherosclerotic heart disease of native coronary artery without angina pectoris: Secondary | ICD-10-CM | POA: Diagnosis not present

## 2015-10-26 DIAGNOSIS — I69354 Hemiplegia and hemiparesis following cerebral infarction affecting left non-dominant side: Secondary | ICD-10-CM | POA: Diagnosis not present

## 2015-10-28 DIAGNOSIS — I10 Essential (primary) hypertension: Secondary | ICD-10-CM | POA: Diagnosis not present

## 2015-10-28 DIAGNOSIS — I2510 Atherosclerotic heart disease of native coronary artery without angina pectoris: Secondary | ICD-10-CM | POA: Diagnosis not present

## 2015-10-28 DIAGNOSIS — E785 Hyperlipidemia, unspecified: Secondary | ICD-10-CM | POA: Diagnosis not present

## 2015-10-28 DIAGNOSIS — I69354 Hemiplegia and hemiparesis following cerebral infarction affecting left non-dominant side: Secondary | ICD-10-CM | POA: Diagnosis not present

## 2015-10-28 DIAGNOSIS — I4891 Unspecified atrial fibrillation: Secondary | ICD-10-CM | POA: Diagnosis not present

## 2015-10-28 DIAGNOSIS — F0390 Unspecified dementia without behavioral disturbance: Secondary | ICD-10-CM | POA: Diagnosis not present

## 2015-10-29 DIAGNOSIS — E785 Hyperlipidemia, unspecified: Secondary | ICD-10-CM | POA: Diagnosis not present

## 2015-10-29 DIAGNOSIS — I10 Essential (primary) hypertension: Secondary | ICD-10-CM | POA: Diagnosis not present

## 2015-10-29 DIAGNOSIS — F0390 Unspecified dementia without behavioral disturbance: Secondary | ICD-10-CM | POA: Diagnosis not present

## 2015-10-29 DIAGNOSIS — I4891 Unspecified atrial fibrillation: Secondary | ICD-10-CM | POA: Diagnosis not present

## 2015-10-29 DIAGNOSIS — I2510 Atherosclerotic heart disease of native coronary artery without angina pectoris: Secondary | ICD-10-CM | POA: Diagnosis not present

## 2015-10-29 DIAGNOSIS — I69354 Hemiplegia and hemiparesis following cerebral infarction affecting left non-dominant side: Secondary | ICD-10-CM | POA: Diagnosis not present

## 2015-11-01 DIAGNOSIS — F0390 Unspecified dementia without behavioral disturbance: Secondary | ICD-10-CM | POA: Diagnosis not present

## 2015-11-01 DIAGNOSIS — E785 Hyperlipidemia, unspecified: Secondary | ICD-10-CM | POA: Diagnosis not present

## 2015-11-01 DIAGNOSIS — I69354 Hemiplegia and hemiparesis following cerebral infarction affecting left non-dominant side: Secondary | ICD-10-CM | POA: Diagnosis not present

## 2015-11-01 DIAGNOSIS — I10 Essential (primary) hypertension: Secondary | ICD-10-CM | POA: Diagnosis not present

## 2015-11-01 DIAGNOSIS — I2510 Atherosclerotic heart disease of native coronary artery without angina pectoris: Secondary | ICD-10-CM | POA: Diagnosis not present

## 2015-11-01 DIAGNOSIS — I4891 Unspecified atrial fibrillation: Secondary | ICD-10-CM | POA: Diagnosis not present

## 2015-11-01 NOTE — Telephone Encounter (Signed)
Called [employer] t# [phone], P.A. Now approved til 01/23/16.  Called brother & informed

## 2015-11-02 ENCOUNTER — Telehealth: Payer: Self-pay

## 2015-11-02 DIAGNOSIS — F0390 Unspecified dementia without behavioral disturbance: Secondary | ICD-10-CM | POA: Diagnosis not present

## 2015-11-02 DIAGNOSIS — I69354 Hemiplegia and hemiparesis following cerebral infarction affecting left non-dominant side: Secondary | ICD-10-CM | POA: Diagnosis not present

## 2015-11-02 DIAGNOSIS — I4891 Unspecified atrial fibrillation: Secondary | ICD-10-CM | POA: Diagnosis not present

## 2015-11-02 DIAGNOSIS — I10 Essential (primary) hypertension: Secondary | ICD-10-CM | POA: Diagnosis not present

## 2015-11-02 DIAGNOSIS — E785 Hyperlipidemia, unspecified: Secondary | ICD-10-CM | POA: Diagnosis not present

## 2015-11-02 DIAGNOSIS — I2510 Atherosclerotic heart disease of native coronary artery without angina pectoris: Secondary | ICD-10-CM | POA: Diagnosis not present

## 2015-11-02 NOTE — Telephone Encounter (Signed)
Dr.Lalonde did you see this 

## 2015-11-02 NOTE — Telephone Encounter (Signed)
Cheri, I will defer this back to you guys since this is Dr. Jola BabinskiLalonde's patient. I had got involved when Dr. Susann GivensLalonde was out on vacation, but [name] was too expensive.  I don't know this guy, so not sure what Dr. Susann GivensLalonde wants to use, Ambien, trazodone, other?

## 2015-11-02 NOTE — Telephone Encounter (Signed)
Vernona RiegerLaura - do we have a prior auth going on this medicatoin?

## 2015-11-02 NOTE — Telephone Encounter (Signed)
Let's get him in for an appointment

## 2015-11-02 NOTE — Telephone Encounter (Signed)
Calvin HewsShane see previous phone call.  P.A. Belsomra was done & completed & approved.  I called the pharmacy & Calvin ScottBelsomra is now going thru but the cost is $157.02 with insurance. There are no discount cards available because pt has Medicare.  Do you want to switch to cheaper medication?

## 2015-11-02 NOTE — Telephone Encounter (Signed)
[facility] asking for cheaper alternative to the Belsomra. The script from 10/11/2015 is $157.02.   Thanks

## 2015-11-03 DIAGNOSIS — E785 Hyperlipidemia, unspecified: Secondary | ICD-10-CM | POA: Diagnosis not present

## 2015-11-03 DIAGNOSIS — I2510 Atherosclerotic heart disease of native coronary artery without angina pectoris: Secondary | ICD-10-CM | POA: Diagnosis not present

## 2015-11-03 DIAGNOSIS — F0390 Unspecified dementia without behavioral disturbance: Secondary | ICD-10-CM | POA: Diagnosis not present

## 2015-11-03 DIAGNOSIS — I69354 Hemiplegia and hemiparesis following cerebral infarction affecting left non-dominant side: Secondary | ICD-10-CM | POA: Diagnosis not present

## 2015-11-03 DIAGNOSIS — I10 Essential (primary) hypertension: Secondary | ICD-10-CM | POA: Diagnosis not present

## 2015-11-03 DIAGNOSIS — I4891 Unspecified atrial fibrillation: Secondary | ICD-10-CM | POA: Diagnosis not present

## 2015-11-03 NOTE — Telephone Encounter (Signed)
Pt has appointment 

## 2015-11-04 ENCOUNTER — Other Ambulatory Visit: Payer: Self-pay

## 2015-11-04 ENCOUNTER — Ambulatory Visit (INDEPENDENT_AMBULATORY_CARE_PROVIDER_SITE_OTHER): Payer: Medicare Other | Admitting: Family Medicine

## 2015-11-04 ENCOUNTER — Encounter: Payer: Self-pay | Admitting: Family Medicine

## 2015-11-04 VITALS — BP 114/60 | HR 117 | Wt 129.0 lb

## 2015-11-04 DIAGNOSIS — Z79899 Other long term (current) drug therapy: Secondary | ICD-10-CM | POA: Diagnosis not present

## 2015-11-04 DIAGNOSIS — R351 Nocturia: Secondary | ICD-10-CM

## 2015-11-04 DIAGNOSIS — G479 Sleep disorder, unspecified: Secondary | ICD-10-CM | POA: Diagnosis not present

## 2015-11-04 DIAGNOSIS — I69354 Hemiplegia and hemiparesis following cerebral infarction affecting left non-dominant side: Secondary | ICD-10-CM | POA: Diagnosis not present

## 2015-11-04 DIAGNOSIS — I4891 Unspecified atrial fibrillation: Secondary | ICD-10-CM | POA: Diagnosis not present

## 2015-11-04 DIAGNOSIS — N3281 Overactive bladder: Secondary | ICD-10-CM

## 2015-11-04 DIAGNOSIS — E785 Hyperlipidemia, unspecified: Secondary | ICD-10-CM | POA: Diagnosis not present

## 2015-11-04 DIAGNOSIS — I2510 Atherosclerotic heart disease of native coronary artery without angina pectoris: Secondary | ICD-10-CM | POA: Diagnosis not present

## 2015-11-04 DIAGNOSIS — I10 Essential (primary) hypertension: Secondary | ICD-10-CM | POA: Diagnosis not present

## 2015-11-04 DIAGNOSIS — F0390 Unspecified dementia without behavioral disturbance: Secondary | ICD-10-CM | POA: Diagnosis not present

## 2015-11-04 MED ORDER — TERAZOSIN HCL 1 MG PO CAPS: 1.0000 mg | ORAL_CAPSULE | Freq: Every day | ORAL | 5 refills | Status: DC

## 2015-11-04 NOTE — Patient Instructions (Signed)
Insomnia Insomnia is a sleep disorder that makes it difficult to fall asleep or to stay asleep. Insomnia can cause tiredness (fatigue), low energy, difficulty concentrating, mood swings, and poor performance at work or school.  There are three different ways to classify insomnia:  Difficulty falling asleep.  Difficulty staying asleep.  Waking up too early in the morning. Any type of insomnia can be long-term (chronic) or short-term (acute). Both are common. Short-term insomnia usually lasts for three months or less. Chronic insomnia occurs at least three times a week for longer than three months. CAUSES  Insomnia may be caused by another condition, situation, or substance, such as:  Anxiety.  Certain medicines.  Gastroesophageal reflux disease (GERD) or other gastrointestinal conditions.  Asthma or other breathing conditions.  Restless legs syndrome, sleep apnea, or other sleep disorders.  Chronic pain.  Menopause. This may include hot flashes.  Stroke.  Abuse of alcohol, tobacco, or illegal drugs.  Depression.  Caffeine.   Neurological disorders, such as Alzheimer disease.  An overactive thyroid (hyperthyroidism). The cause of insomnia may not be known. RISK FACTORS Risk factors for insomnia include:  Gender. Women are more commonly affected than men.  Age. Insomnia is more common as you get older.  Stress. This may involve your professional or personal life.  Income. Insomnia is more common in people with lower income.  Lack of exercise.   Irregular work schedule or night shifts.  Traveling between different time zones. SIGNS AND SYMPTOMS If you have insomnia, trouble falling asleep or trouble staying asleep is the main symptom. This may lead to other symptoms, such as:  Feeling fatigued.  Feeling nervous about going to sleep.  Not feeling rested in the morning.  Having trouble concentrating.  Feeling irritable, anxious, or depressed. TREATMENT   Treatment for insomnia depends on the cause. If your insomnia is caused by an underlying condition, treatment will focus on addressing the condition. Treatment may also include:   Medicines to help you sleep.  Counseling or therapy.  Lifestyle adjustments. HOME CARE INSTRUCTIONS   Take medicines only as directed by your health care provider.  Keep regular sleeping and waking hours. Avoid naps.  Keep a sleep diary to help you and your health care provider figure out what could be causing your insomnia. Include:   When you sleep.  When you wake up during the night.  How well you sleep.   How rested you feel the next day.  Any side effects of medicines you are taking.  What you eat and drink.   Make your bedroom a comfortable place where it is easy to fall asleep:  Put up shades or special blackout curtains to block light from outside.  Use a white noise machine to block noise.  Keep the temperature cool.   Exercise regularly as directed by your health care provider. Avoid exercising right before bedtime.  Use relaxation techniques to manage stress. Ask your health care provider to suggest some techniques that may work well for you. These may include:  Breathing exercises.  Routines to release muscle tension.  Visualizing peaceful scenes.  Cut back on alcohol, caffeinated beverages, and cigarettes, especially close to bedtime. These can disrupt your sleep.  Do not overeat or eat spicy foods right before bedtime. This can lead to digestive discomfort that can make it hard for you to sleep.  Limit screen use before bedtime. This includes:  Watching TV.  Using your smartphone, tablet, and computer.  Stick to a routine. This   can help you fall asleep faster. Try to do a quiet activity, brush your teeth, and go to bed at the same time each night.  Get out of bed if you are still awake after 15 minutes of trying to sleep. Keep the lights down, but try reading or  doing a quiet activity. When you feel sleepy, go back to bed.  Make sure that you drive carefully. Avoid driving if you feel very sleepy.  Keep all follow-up appointments as directed by your health care provider. This is important. SEEK MEDICAL CARE IF:   You are tired throughout the day or have trouble in your daily routine due to sleepiness.  You continue to have sleep problems or your sleep problems get worse. SEEK IMMEDIATE MEDICAL CARE IF:   You have serious thoughts about hurting yourself or someone else.   This information is not intended to replace advice given to you by your health care provider. Make sure you discuss any questions you have with your health care provider.   Document Released: 01/07/2000 Document Revised: 09/30/2014 Document Reviewed: 10/10/2013 Elsevier Interactive Patient Education [employer]vier Inc. Get into a regular pattern of going to bed at roughly the same time and getting up at roughly the same time with no naps. Make sure the bladder is empty and also make sure that fluids are decreased later on in the day

## 2015-11-04 NOTE — Progress Notes (Signed)
   Subjective:    Patient ID: Calvin Diaz, male    DOB: [birth year], 75 y.o.   MRN: [id number]  HPI He is here for consult concerning difficulty with sleep. It was difficult to get a coherent history but after several minutes it became much more clear. He apparently wakes up in the middle of the night and then is unable to fall back to sleep and when he does he will sleep for several hours. During the day he will also catch several hours worth of sleep. Also he's had difficulty with urinary continence. He apparently notices the urge to urinate and then leaks before he can get to the bathroom. He is wearing depends at night to help with this. He continues on medications listed in the chart. He apparently does not have as much difficulty during the day with urination and that he is able to apparently get to the bathroom when he has a urge.   Review of Systems     Objective:   Physical Exam Alert and in no distress otherwise not examined       Assessment & Plan:  Nocturia - Plan: terazosin (HYTRIN) 1 MG capsule  OAB (overactive bladder) - Plan: terazosin (HYTRIN) 1 MG capsule  Encounter for long-term (current) use of medications  Sleep disturbance I explained there are several issues going on here. Explained that sleep medicine is not necessarily what is needed but he needs to work on sleep hygiene, specifically no sleeping/napping during the day and get into a regular nighttime routine. Also recommend decreasing fluid intake later on in the evening empty his bladder before bedtime. I will place him on Hytrin see if this will help with his overactive bladder symptoms. They're to keep in touch with me on this. I did explain that we do have several options in terms of the urinating however the nighttime issue needs to be resolved with better sleep hygiene.

## 2015-12-06 ENCOUNTER — Telehealth: Payer: Self-pay | Admitting: Internal Medicine

## 2015-12-06 ENCOUNTER — Ambulatory Visit (INDEPENDENT_AMBULATORY_CARE_PROVIDER_SITE_OTHER): Payer: Medicare Other | Admitting: Family Medicine

## 2015-12-06 ENCOUNTER — Encounter: Payer: Self-pay | Admitting: Family Medicine

## 2015-12-06 VITALS — BP 110/60 | HR 80 | Ht 71.0 in | Wt 130.0 lb

## 2015-12-06 DIAGNOSIS — R29898 Other symptoms and signs involving the musculoskeletal system: Secondary | ICD-10-CM | POA: Diagnosis not present

## 2015-12-06 DIAGNOSIS — Z8679 Personal history of other diseases of the circulatory system: Secondary | ICD-10-CM

## 2015-12-06 DIAGNOSIS — R531 Weakness: Secondary | ICD-10-CM

## 2015-12-06 DIAGNOSIS — I6930 Unspecified sequelae of cerebral infarction: Secondary | ICD-10-CM

## 2015-12-06 LAB — CBC WITH DIFFERENTIAL/PLATELET
BASOS ABS: 0 {cells}/uL (ref 0–200)
Basophils Relative: 0 %
EOS PCT: 0 %
Eosinophils Absolute: 0 cells/uL — ABNORMAL LOW (ref 15–500)
HCT: 37.2 % — ABNORMAL LOW (ref 38.5–50.0)
Hemoglobin: 12.2 g/dL — ABNORMAL LOW (ref 13.2–17.1)
Lymphocytes Relative: 13 %
Lymphs Abs: 1612 cells/uL (ref 850–3900)
MCH: 30 pg (ref 27.0–33.0)
MCHC: 32.8 g/dL (ref 32.0–36.0)
MCV: 91.6 fL (ref 80.0–100.0)
MONOS PCT: 12 %
MPV: 9.5 fL (ref 7.5–12.5)
Monocytes Absolute: 1488 cells/uL — ABNORMAL HIGH (ref 200–950)
NEUTROS ABS: 9300 {cells}/uL — AB (ref 1500–7800)
NEUTROS PCT: 75 %
PLATELETS: 281 10*3/uL (ref 140–400)
RBC: 4.06 MIL/uL — ABNORMAL LOW (ref 4.20–5.80)
RDW: 14.9 % (ref 11.0–15.0)
WBC: 12.4 10*3/uL — AB (ref 4.0–10.5)

## 2015-12-06 LAB — BASIC METABOLIC PANEL
BUN: 16 mg/dL (ref 7–25)
CALCIUM: 9.3 mg/dL (ref 8.6–10.3)
CO2: 30 mmol/L (ref 20–31)
Chloride: 99 mmol/L (ref 98–110)
Creat: 0.9 mg/dL (ref 0.70–1.18)
Glucose, Bld: 110 mg/dL — ABNORMAL HIGH (ref 65–99)
Potassium: 3.5 mmol/L (ref 3.5–5.3)
SODIUM: 138 mmol/L (ref 135–146)

## 2015-12-06 NOTE — Telephone Encounter (Signed)
error 

## 2015-12-06 NOTE — Patient Instructions (Signed)
Please go to [address] tomorrow morning for head CT. This is to look for any new stroke or changes in the brain. We will be referring you to a neurologist.  We also need to get you back in with the cardiologist for follow-up.  It doesn't appear that you are in atrial fibrillation today, but going in and out can put you at increased risk for stroke.  Given your fall yesterday, I'm hesitant to put you on a blood thinner at this time.  I would like for you to start taking aspirin 325mg  once daily, and will let the cardiologists decide on the next appropriate step. You have been scheduled a visit with the cardiologist for tomorrow at 2pm at [address] [place], [place]).  Please try and collect a urine specimen for us in the cup provided. You need to keep the urine refrigerated until you are able to drop it off at our office (within 24 hours).  If you develop any new neurologist symptoms, please call [phone].

## 2015-12-06 NOTE — Progress Notes (Signed)
Chief Complaint  Patient presents with  . Advice Only    brother brings him in today. Feels like in the last week and a half his health has been declining. Patient lives in an apartment with his wife. Left side of body he is not really using very much in the last week and a half and has been complaining of pain on his left side. Left leg is weakening.    Memory was affected, left hand was weak from prior stroke. He had been walking fine.  In the last 1-2 weeks he started declining. He has been complaining of pain in his left arm and left leg, sometimes on the right as well.  He now reports pain and weakness with walking. Today he really needed assistance with his walking.  He walks with a cane (also has a walker, but can't use related to problems with the left hand). No longer getting any PT/OT--got until about a month ago, s/p CVA.  Trying to find a facility for him during the day, but he is no longer independent, and won't be accepted into program if he isn't mobile, requires more care, as he currently does.  He does not have a local neurologist.  He fell yesterday.  While at the sink, brushing his teeth.  Fell onto his bottom. Denies any head injury.  He was sitting on the floor until his brother came over to help him get up (15-20 minutes). Pain started 2 weeks ago, not related to any fall (denies prior falls/trauma/injury).  Currently denies pain, swelling in his joints. H/o swelling in ankles and hand, but not recently.   Saw cardiologist in August for hospital f/u.  He had been diagnosed with afib. The plan was to anticoagulate with Xarelto x 4 weeks, then plan for DCCV.  He hasn't had f/u with cardiologist yet. There was a misunderstanding with the appointment (was late, after coming to our office instead of the cardiologist's office), wasn't able to be seen.  They haven't contact him to reschedule visit yet. Looks like he was only rx'd #30 of xarelto, with no refill.  Wife confirmed  via text today that he was no longer taking the xarelto. Patient denies chest pain, palpitations. Complaining of a headache currently, 8/10. Mostly across his forehead.  Denies URI symptoms--chronic runny nose, unchanged, and rare cough.  Urinary incontinence--since stroke. Strong odor, but unchanged. No blood in the urine. He has been wearing Depends (all the time--not just at night as mentioned in Dr. Lanice Shirts October note).  He will have the urge to void, but when he goes to the bathroom, he doesn't actually void.   PMH, PSH, SH reviewed.  Outpatient Encounter Prescriptions as of 12/06/2015  Medication Sig  . amLODipine (NORVASC) 10 MG tablet Take 1 tablet (10 mg total) by mouth daily.  . memantine (NAMENDA) 5 MG tablet Take 2 tablets (10 mg total) by mouth daily.  . Multiple Vitamin (MULTIVITAMIN WITH MINERALS) TABS tablet Take 1 tablet by mouth daily.  . Suvorexant (BELSOMRA) 10 MG TABS Take 1 tablet by mouth daily.  Marland Kitchen terazosin (HYTRIN) 1 MG capsule Take 1 capsule (1 mg total) by mouth at bedtime.  . Chlorpheniramine-DM (CORICIDIN HBP COUGH/COLD PO) Take 1 tablet by mouth every 12 (twelve) hours as needed (cough/congestion).   . pravastatin (PRAVACHOL) 80 MG tablet Take 1 tablet (80 mg total) by mouth at bedtime. (Patient not taking: Reported on 12/06/2015)  . rivaroxaban (XARELTO) 20 MG TABS tablet Take 1 tablet (20  mg total) by mouth daily with supper. (Patient not taking: Reported on 12/06/2015)   No facility-administered encounter medications on file as of 12/06/2015.    No Known Allergies  ROS:  No fever, chills.  +headache today.  Slight nausea yesterday, none now.  Denies constipation or diarrhea. Rare cough.  Denies chest pain, shortness of breath. Chronic runny nose--unchanged, clear mucus. Denies changes to urine, chronic incontinence since stroke.  No bleeding, bruising, rash.  PHYSICAL EXAM: BP 110/60 (BP Location: Left Arm, Patient Position: Sitting, Cuff Size: Normal)    Pulse 80   Ht '5\' 11"'$  (1.803 m)   Wt 130 lb (59 kg)   BMI 18.13 kg/m   Pleasant, thin male, appearing comfortable, in no distress.  He is quiet, but answers questions appropriately. Sitting in wheelchair. HEENT: conjunctiva and sclera are clear.  OP is clear--no erythema, moist mucus membranes Neck: no lymphadenopathy, thyromegaly or mass Heart: regular rhythm with occasional ectopic beat (extra beat, pause); rhythm overall is regular, normal rate. Lungs: clear bilaterally Back: no CVA tenderness Abdomen: soft, nontender (examined in wheelchair) Extremities: no edema Skin: normal turgor, no rash Neuro: alert. Cranial nerves intact Upper extremities: Unable to left left elbow up off the armrest.  He has 3/5 strength at biceps/triceps, fingers. 5-/5 strength on the right. Hip flexor--4+/5 on right, unable to lift against gravity on the left. Normal plantarflexion/dorsiflexion on right, weak on the left.  Gait not assessed  ASSESSMENT/PLAN:  Generalized weakness - Plan: CBC with Differential/Platelet, Basic metabolic panel  History of stroke with residual deficit - Plan: CT Head Wo Contrast, Ambulatory referral to Neurology  Left leg weakness - Plan: CT Head Wo Contrast, Ambulatory referral to Neurology  H/O atrial fibrillation without current medication - appears to be in NSR today. not on blood thinners. At risk for recurrent CVA if in and out; start ASA, f/u cardiology. Now a fall risk   CBC, b-met today Head CT in am If any new symptoms develop, go to ER  Needs f/u with cardiologist for afib.--arranged for tomorrow  Need urine sample to r/o UTI.  Given container to bring back.   Will need to contact [facility] for home PT (await CT results)  >30 minutes spent with patient, brother and wife, all questions answered.

## 2015-12-07 ENCOUNTER — Encounter (INDEPENDENT_AMBULATORY_CARE_PROVIDER_SITE_OTHER): Payer: Self-pay

## 2015-12-07 ENCOUNTER — Ambulatory Visit
Admission: RE | Admit: 2015-12-07 | Discharge: 2015-12-07 | Disposition: A | Discharge status: Home / Self Care | Payer: Medicare Other | Source: Ambulatory Visit | Attending: Family Medicine | Admitting: Family Medicine

## 2015-12-07 ENCOUNTER — Ambulatory Visit (INDEPENDENT_AMBULATORY_CARE_PROVIDER_SITE_OTHER): Payer: Medicare Other | Admitting: Physician Assistant

## 2015-12-07 ENCOUNTER — Encounter: Payer: Self-pay | Admitting: Physician Assistant

## 2015-12-07 ENCOUNTER — Encounter: Payer: Self-pay | Admitting: Family Medicine

## 2015-12-07 ENCOUNTER — Ambulatory Visit
Admission: RE | Admit: 2015-12-07 | Discharge: 2015-12-07 | Disposition: A | Discharge status: Home / Self Care | Payer: Medicare Other | Source: Ambulatory Visit | Attending: Physician Assistant | Admitting: Physician Assistant

## 2015-12-07 VITALS — BP 110/64 | HR 104 | Ht 71.0 in | Wt 130.0 lb

## 2015-12-07 DIAGNOSIS — I6930 Unspecified sequelae of cerebral infarction: Secondary | ICD-10-CM

## 2015-12-07 DIAGNOSIS — I639 Cerebral infarction, unspecified: Secondary | ICD-10-CM | POA: Diagnosis not present

## 2015-12-07 DIAGNOSIS — R059 Cough, unspecified: Secondary | ICD-10-CM

## 2015-12-07 DIAGNOSIS — R29898 Other symptoms and signs involving the musculoskeletal system: Secondary | ICD-10-CM

## 2015-12-07 DIAGNOSIS — I10 Essential (primary) hypertension: Secondary | ICD-10-CM | POA: Diagnosis not present

## 2015-12-07 DIAGNOSIS — Z8673 Personal history of transient ischemic attack (TIA), and cerebral infarction without residual deficits: Secondary | ICD-10-CM

## 2015-12-07 DIAGNOSIS — I481 Persistent atrial fibrillation: Secondary | ICD-10-CM | POA: Diagnosis not present

## 2015-12-07 DIAGNOSIS — R05 Cough: Secondary | ICD-10-CM

## 2015-12-07 DIAGNOSIS — E785 Hyperlipidemia, unspecified: Secondary | ICD-10-CM

## 2015-12-07 DIAGNOSIS — I4819 Other persistent atrial fibrillation: Secondary | ICD-10-CM

## 2015-12-07 MED ORDER — RIVAROXABAN 20 MG PO TABS: 20.0000 mg | ORAL_TABLET | Freq: Every day | ORAL | 3 refills | Status: DC

## 2015-12-07 MED ORDER — PRAVASTATIN SODIUM 80 MG PO TABS: 80.0000 mg | ORAL_TABLET | Freq: Every day | ORAL | 3 refills | Status: DC

## 2015-12-07 NOTE — Progress Notes (Signed)
Cardiology Office Note    Date:  12/07/2015   ID:  Calvin HawkingJames Finfrock, DOB May 13, 1940, MRN [id number]  PCP:  Carollee HerterLALONDE,JOHN CHARLES, MD  Cardiologist:  Dr. Tenny Crawoss  CC: follow up   History of Present Illness:  Calvin Diaz is a 75 y.o. male with a history of HTN, HLD, dementia, CVA, and persistant atrial fibrillation on Xarelto who presents to clinic for follow up.   [place] in [place] until about a couple months ago when he moved here to live with family. Family reports he was in good state of health until about 07/2015 when he was admitted to [facility] and diagnosed with a stroke. He was then discharged from there and came here to live with his brother. Brother reports he has been receiving in home health care with PT and speech therapy and had been doing well. The patient and his brother deny any significant cardiac hx including arrhthymias or heart catheterizations.   [facility]17[facility]. He initially presented with sudden onset of centralized chest pain that lasted about 30 minutes and then subsided. His EKG did show new a fib with rate of 69. He was placed on IV heparin for anticoagulation. He ruled out by enzymes for MI. Echo with normal LVF. He was started on Xarelto for increased CHADs2VASC score. HR controlled on no AVN blocking agents. Plan was for 4 weeks anticoagulation prior to DCCV. Plan was for outpt nuclear stress test to workup CP.    I saw him for post hospital follow up on 09/22/15. He was doing well. He was in afib with controlled rate on no AVN blocking agents. I set up a stress test which was low risk. Plan was to bring him back and if he remained in afib after 1 month of Xarelto we would set up a DCCV.   He was seen by Dr. Lynelle DoctorKnapp with primary care yesterday for weakness and pain in extremities. CBC showed elevated WBC. CT scan of head done this AM which showed no acute changes.   Today he presents to clinic for follow up. He ran out  of Xarelto a couple weeks ago. He has felt very weak recently and he hasn't been able to walk and has been having pain down both of his legs. No LE edema, orthopnea or PND. No dizziness or syncope. No palpitations. No blood in stool or urine.     Past Medical History:  Diagnosis Date  . Dementia   . High cholesterol   . Hypertension   . Myocardial infarction   . Stroke [facility](HCC)     Past Surgical History:  Procedure Laterality Date  . CORONARY ANGIOPLASTY      Current Medications: Outpatient Medications Prior to Visit  Medication Sig Dispense Refill  . amLODipine (NORVASC) 10 MG tablet Take 1 tablet (10 mg total) by mouth daily. 90 tablet 3  . Chlorpheniramine-DM (CORICIDIN HBP COUGH/COLD PO) Take 1 tablet by mouth every 12 (twelve) hours as needed (cough/congestion).     . memantine (NAMENDA) 5 MG tablet Take 2 tablets (10 mg total) by mouth daily. 90 tablet 3  . Multiple Vitamin (MULTIVITAMIN WITH MINERALS) TABS tablet Take 1 tablet by mouth daily.    . Suvorexant (BELSOMRA) 10 MG TABS Take 1 tablet by mouth daily. 30 tablet 0  . terazosin (HYTRIN) 1 MG capsule Take 1 capsule (1 mg total) by mouth at bedtime. 30 capsule 5  . pravastatin (PRAVACHOL) 80 MG tablet Take 1  tablet (80 mg total) by mouth at bedtime. (Patient not taking: Reported on 12/07/2015) 90 tablet 3  . rivaroxaban (XARELTO) 20 MG TABS tablet Take 1 tablet (20 mg total) by mouth daily with supper. (Patient not taking: Reported on 12/07/2015) 30 tablet 0   No facility-administered medications prior to visit.      Allergies:   Patient has no known allergies.   Social History   Social History  . Marital status: Divorced    Spouse name: N/A  . Number of children: N/A  . Years of education: N/A   Social History Main Topics  . Smoking status: Former Smoker    Types: Cigarettes  . Smokeless tobacco: Former Neurosurgeon    Quit date: 08/18/2015  . Alcohol use No     Comment: Previously daily  . Drug use: No  . Sexual  activity: Not Currently   Other Topics Concern  . None   Social History Narrative  . None     Family History:  The patient's family history includes Arrhythmia in his mother; Stroke in his maternal aunt.     ROS:   Please see the history of present illness.    ROS All other systems reviewed and are negative.   PHYSICAL EXAM:   VS:  BP 110/64 (BP Location: Right Arm, Patient Position: Sitting, Cuff Size: Normal)   Pulse (!) 104   Ht 5\' 11"  (1.803 m)   Wt 130 lb (59 kg)   BMI 18.13 kg/m    GEN: Well nourished, well developed, in no acute distress, chronically ill appearing HEENT: normal  Neck: no JVD, carotid bruits, or masses Cardiac: irreg irreg; no murmurs, rubs, or gallops,no edema  Respiratory:  clear to auscultation bilaterally, normal work of breathing GI: soft, nontender, nondistended, + BS MS: no deformity or atrophy  Skin: warm and dry, no rash Neuro:  Alert and Oriented x 3, Strength and sensation are intact Psych: euthymic mood, full affect  Wt Readings from Last 3 Encounters:  12/07/15 130 lb (59 kg)  12/06/15 130 lb (59 kg)  11/04/15 129 lb (58.5 kg)      Studies/Labs Reviewed:   EKG:  EKG is ordered today.  The ekg ordered today demonstrates afib with HR 104. Course baseline artifact.   Recent Labs: 09/07/2015: Magnesium 2.0; TSH 0.639 09/20/2015: ALT 12 12/06/2015: BUN 16; Creat 0.90; Hemoglobin 12.2; Platelets 281; Potassium 3.5; Sodium 138   Lipid Panel    Component Value Date/Time   CHOL 127 08/18/2015 1512   TRIG 64 08/18/2015 1512   HDL 57 08/18/2015 1512   CHOLHDL 2.2 08/18/2015 1512   VLDL 13 08/18/2015 1512   LDLCALC 57 08/18/2015 1512    Additional studies/ records that were reviewed today include:  TTE: 09/08/2015 Study Conclusions - Left ventricle: The cavity size was normal. Systolic function was normal. The estimated ejection fraction was in the range of 60% to 65%. Wall motion was normal; there were no regional  wall motion abnormalities. The study is not technically sufficient to allow evaluation of LV diastolic function. - Aortic valve: Nodular calcification of left and non coronary cusps - Mitral valve: Calcified annulus. - Right atrium: The atrium was mildly dilated. - Atrial septum: No defect or patent foramen ovale was identified.   Nuclear stress test 09/29/15 Study Highlights    Nuclear stress EF: 74%.  There was no ST segment deviation noted during stress. Patient was in atrial fibrillation during the study.  There is a small in size,  moderate in intensity, fixed defect in the mid anteroseptal and apical septal walls. No ischemia noted.  This is a low risk study.  The left ventricular ejection fraction is hyperdynamic (>65%).       ASSESSMENT & PLAN:   Chest pain: no recurrence. Nuclear stress test 09/29/15 was low risk for ischemia.    Persistent atrial fibrillation: currently HR mildly elevated today at 104. His heart rate was previously well controlled on no AV noda blocking agents. Perhaps he has an infection and this is just a physiologic response. He ran out of Xarelto. I educated him on how this will likely be a long term medication and to call us if he runes out again. CHADSVASC score of 4. He is basically asymptomatic with this but does feel weak. I would like to plan for DCCV after 4 weeks of uninterrupted Xarelto therapy   Leukocytosis: he is supposed to get a UA through PCP but difficult with incontinence. Will get a CXR to rule out PNA as he has been coughing and will send to PCP.    HTN: BP well controlled on current regimen   HLD: continue statin   Hx of CVA: continue Xarelto   LE pain and weakness: head CT negative    Medication Adjustments/Labs and Tests Ordered: Current medicines are reviewed at length with the patient today.  Concerns regarding medicines are outlined above.  Medication changes, Labs and Tests ordered today are listed in the  Patient Instructions below. Patient Instructions  Medication Instructions:  Your physician recommends that you continue on your current medications as directed. Please refer to the Current Medication list given to you today.   Labwork: None ordered  Testing/Procedures: A chest x-ray takes a picture of the organs and structures inside the chest, including the heart, lungs, and blood vessels. This test can show several things, including, whether the heart is enlarges; whether fluid is building up in the lungs; and whether pacemaker / defibrillator leads are still in place.  Follow-Up: Your physician recommends that you schedule a follow-up appointment in: 3-4 WEEKS WITH KATIE Aleksandr Pellow, PA-C   Any Other Special Instructions Will Be Listed Below (If Applicable).   If you need a refill on your cardiac medications before your next appointment, please call your pharmacy.      Signed, Cline CrockKathryn Tamber Burtch, PA-C  12/07/2015 2:40 PM    [facility] [address], [place], [place]  [address] Phone: [phone]; Fax: [phone]

## 2015-12-07 NOTE — Patient Instructions (Addendum)
Medication Instructions:  Your physician recommends that you continue on your current medications as directed. Please refer to the Current Medication list given to you today.   Labwork: None ordered  Testing/Procedures: A chest x-ray takes a picture of the organs and structures inside the chest, including the heart, lungs, and blood vessels. This test can show several things, including, whether the heart is enlarges; whether fluid is building up in the lungs; and whether pacemaker / defibrillator leads are still in place.  Follow-Up: Your physician recommends that you schedule a follow-up appointment in: 3-4 WEEKS WITH KATIE THOMPSON, PA-C   Any Other Special Instructions Will Be Listed Below (If Applicable).   If you need a refill on your cardiac medications before your next appointment, please call your pharmacy.

## 2015-12-13 ENCOUNTER — Emergency Department (HOSPITAL_COMMUNITY): Payer: Medicare Other

## 2015-12-13 ENCOUNTER — Encounter (HOSPITAL_COMMUNITY): Payer: Self-pay | Admitting: Emergency Medicine

## 2015-12-13 ENCOUNTER — Inpatient Hospital Stay (HOSPITAL_COMMUNITY)
Admission: EM | Admit: 2015-12-13 | Discharge: 2015-12-21 | DRG: 553 | Disposition: A | Discharge status: Skilled Nursing Facility | Payer: Medicare Other | Attending: Internal Medicine | Admitting: Internal Medicine

## 2015-12-13 DIAGNOSIS — M6281 Muscle weakness (generalized): Secondary | ICD-10-CM | POA: Diagnosis not present

## 2015-12-13 DIAGNOSIS — R4781 Slurred speech: Secondary | ICD-10-CM | POA: Diagnosis present

## 2015-12-13 DIAGNOSIS — M25422 Effusion, left elbow: Secondary | ICD-10-CM

## 2015-12-13 DIAGNOSIS — R509 Fever, unspecified: Secondary | ICD-10-CM

## 2015-12-13 DIAGNOSIS — M130 Polyarthritis, unspecified: Secondary | ICD-10-CM | POA: Diagnosis present

## 2015-12-13 DIAGNOSIS — F5104 Psychophysiologic insomnia: Secondary | ICD-10-CM | POA: Diagnosis present

## 2015-12-13 DIAGNOSIS — R1312 Dysphagia, oropharyngeal phase: Secondary | ICD-10-CM | POA: Diagnosis not present

## 2015-12-13 DIAGNOSIS — H5347 Heteronymous bilateral field defects: Secondary | ICD-10-CM | POA: Diagnosis present

## 2015-12-13 DIAGNOSIS — E785 Hyperlipidemia, unspecified: Secondary | ICD-10-CM | POA: Diagnosis present

## 2015-12-13 DIAGNOSIS — M13822 Other specified arthritis, left elbow: Principal | ICD-10-CM | POA: Diagnosis present

## 2015-12-13 DIAGNOSIS — E7800 Pure hypercholesterolemia, unspecified: Secondary | ICD-10-CM | POA: Diagnosis present

## 2015-12-13 DIAGNOSIS — Z66 Do not resuscitate: Secondary | ICD-10-CM | POA: Diagnosis present

## 2015-12-13 DIAGNOSIS — Z9861 Coronary angioplasty status: Secondary | ICD-10-CM | POA: Diagnosis not present

## 2015-12-13 DIAGNOSIS — I6300 Cerebral infarction due to thrombosis of unspecified precerebral artery: Secondary | ICD-10-CM | POA: Diagnosis not present

## 2015-12-13 DIAGNOSIS — M25522 Pain in left elbow: Secondary | ICD-10-CM | POA: Diagnosis present

## 2015-12-13 DIAGNOSIS — I10 Essential (primary) hypertension: Secondary | ICD-10-CM | POA: Diagnosis not present

## 2015-12-13 DIAGNOSIS — I4891 Unspecified atrial fibrillation: Secondary | ICD-10-CM | POA: Diagnosis not present

## 2015-12-13 DIAGNOSIS — E876 Hypokalemia: Secondary | ICD-10-CM | POA: Diagnosis present

## 2015-12-13 DIAGNOSIS — Z7901 Long term (current) use of anticoagulants: Secondary | ICD-10-CM

## 2015-12-13 DIAGNOSIS — D62 Acute posthemorrhagic anemia: Secondary | ICD-10-CM | POA: Diagnosis present

## 2015-12-13 DIAGNOSIS — R410 Disorientation, unspecified: Secondary | ICD-10-CM

## 2015-12-13 DIAGNOSIS — L8990 Pressure ulcer of unspecified site, unspecified stage: Secondary | ICD-10-CM | POA: Insufficient documentation

## 2015-12-13 DIAGNOSIS — I252 Old myocardial infarction: Secondary | ICD-10-CM | POA: Diagnosis not present

## 2015-12-13 DIAGNOSIS — Z87891 Personal history of nicotine dependence: Secondary | ICD-10-CM | POA: Diagnosis not present

## 2015-12-13 DIAGNOSIS — M109 Gout, unspecified: Secondary | ICD-10-CM | POA: Diagnosis present

## 2015-12-13 DIAGNOSIS — I69354 Hemiplegia and hemiparesis following cerebral infarction affecting left non-dominant side: Secondary | ICD-10-CM

## 2015-12-13 DIAGNOSIS — R0602 Shortness of breath: Secondary | ICD-10-CM | POA: Diagnosis not present

## 2015-12-13 DIAGNOSIS — I639 Cerebral infarction, unspecified: Secondary | ICD-10-CM | POA: Diagnosis not present

## 2015-12-13 DIAGNOSIS — H9190 Unspecified hearing loss, unspecified ear: Secondary | ICD-10-CM | POA: Diagnosis present

## 2015-12-13 DIAGNOSIS — I638 Other cerebral infarction: Secondary | ICD-10-CM | POA: Diagnosis not present

## 2015-12-13 DIAGNOSIS — I481 Persistent atrial fibrillation: Secondary | ICD-10-CM | POA: Diagnosis not present

## 2015-12-13 DIAGNOSIS — R41841 Cognitive communication deficit: Secondary | ICD-10-CM | POA: Diagnosis not present

## 2015-12-13 DIAGNOSIS — R609 Edema, unspecified: Secondary | ICD-10-CM

## 2015-12-13 DIAGNOSIS — I63411 Cerebral infarction due to embolism of right middle cerebral artery: Secondary | ICD-10-CM | POA: Diagnosis not present

## 2015-12-13 DIAGNOSIS — I6930 Unspecified sequelae of cerebral infarction: Secondary | ICD-10-CM

## 2015-12-13 DIAGNOSIS — M7989 Other specified soft tissue disorders: Secondary | ICD-10-CM | POA: Diagnosis not present

## 2015-12-13 DIAGNOSIS — R6510 Systemic inflammatory response syndrome (SIRS) of non-infectious origin without acute organ dysfunction: Secondary | ICD-10-CM | POA: Diagnosis not present

## 2015-12-13 DIAGNOSIS — M25572 Pain in left ankle and joints of left foot: Secondary | ICD-10-CM | POA: Diagnosis not present

## 2015-12-13 DIAGNOSIS — Z79899 Other long term (current) drug therapy: Secondary | ICD-10-CM | POA: Diagnosis not present

## 2015-12-13 DIAGNOSIS — I2510 Atherosclerotic heart disease of native coronary artery without angina pectoris: Secondary | ICD-10-CM | POA: Diagnosis present

## 2015-12-13 DIAGNOSIS — I63511 Cerebral infarction due to unspecified occlusion or stenosis of right middle cerebral artery: Secondary | ICD-10-CM | POA: Diagnosis present

## 2015-12-13 DIAGNOSIS — D72829 Elevated white blood cell count, unspecified: Secondary | ICD-10-CM | POA: Diagnosis present

## 2015-12-13 DIAGNOSIS — I6789 Other cerebrovascular disease: Secondary | ICD-10-CM | POA: Diagnosis not present

## 2015-12-13 DIAGNOSIS — R000 Tachycardia, unspecified: Secondary | ICD-10-CM

## 2015-12-13 DIAGNOSIS — I69391 Dysphagia following cerebral infarction: Secondary | ICD-10-CM

## 2015-12-13 DIAGNOSIS — I482 Chronic atrial fibrillation: Secondary | ICD-10-CM | POA: Diagnosis not present

## 2015-12-13 DIAGNOSIS — F0390 Unspecified dementia without behavioral disturbance: Secondary | ICD-10-CM | POA: Diagnosis not present

## 2015-12-13 DIAGNOSIS — Z823 Family history of stroke: Secondary | ICD-10-CM

## 2015-12-13 DIAGNOSIS — G9389 Other specified disorders of brain: Secondary | ICD-10-CM | POA: Diagnosis present

## 2015-12-13 DIAGNOSIS — R471 Dysarthria and anarthria: Secondary | ICD-10-CM | POA: Diagnosis present

## 2015-12-13 DIAGNOSIS — F0150 Vascular dementia without behavioral disturbance: Secondary | ICD-10-CM | POA: Diagnosis not present

## 2015-12-13 DIAGNOSIS — M24522 Contracture, left elbow: Secondary | ICD-10-CM | POA: Diagnosis present

## 2015-12-13 DIAGNOSIS — I6523 Occlusion and stenosis of bilateral carotid arteries: Secondary | ICD-10-CM | POA: Diagnosis not present

## 2015-12-13 DIAGNOSIS — R1310 Dysphagia, unspecified: Secondary | ICD-10-CM | POA: Diagnosis present

## 2015-12-13 DIAGNOSIS — M25512 Pain in left shoulder: Secondary | ICD-10-CM | POA: Diagnosis not present

## 2015-12-13 DIAGNOSIS — Z5181 Encounter for therapeutic drug level monitoring: Secondary | ICD-10-CM | POA: Diagnosis not present

## 2015-12-13 DIAGNOSIS — R4182 Altered mental status, unspecified: Secondary | ICD-10-CM | POA: Diagnosis not present

## 2015-12-13 DIAGNOSIS — R2689 Other abnormalities of gait and mobility: Secondary | ICD-10-CM | POA: Diagnosis not present

## 2015-12-13 DIAGNOSIS — M25571 Pain in right ankle and joints of right foot: Secondary | ICD-10-CM | POA: Diagnosis present

## 2015-12-13 LAB — URINALYSIS, ROUTINE W REFLEX MICROSCOPIC
GLUCOSE, UA: NEGATIVE mg/dL
Hgb urine dipstick: NEGATIVE
KETONES UR: NEGATIVE mg/dL
LEUKOCYTES UA: NEGATIVE
Nitrite: NEGATIVE
PH: 6 (ref 5.0–8.0)
PROTEIN: 30 mg/dL — AB
Specific Gravity, Urine: 1.023 (ref 1.005–1.030)

## 2015-12-13 LAB — COMPREHENSIVE METABOLIC PANEL
ALBUMIN: 2.8 g/dL — AB (ref 3.5–5.0)
ALK PHOS: 65 U/L (ref 38–126)
ALT: 24 U/L (ref 17–63)
ANION GAP: 10 (ref 5–15)
AST: 34 U/L (ref 15–41)
BILIRUBIN TOTAL: 0.4 mg/dL (ref 0.3–1.2)
BUN: 20 mg/dL (ref 6–20)
CALCIUM: 9.2 mg/dL (ref 8.9–10.3)
CO2: 29 mmol/L (ref 22–32)
CREATININE: 1.14 mg/dL (ref 0.61–1.24)
Chloride: 102 mmol/L (ref 101–111)
GFR calc non Af Amer: >60 mL/min (ref >60)
GLUCOSE: 132 mg/dL — AB (ref 65–99)
Potassium: 3 mmol/L — ABNORMAL LOW (ref 3.5–5.1)
SODIUM: 141 mmol/L (ref 135–145)
TOTAL PROTEIN: 8.1 g/dL (ref 6.5–8.1)

## 2015-12-13 LAB — URINE MICROSCOPIC-ADD ON
RBC / HPF: NONE SEEN RBC/hpf (ref 0–5)
Squamous Epithelial / LPF: NONE SEEN

## 2015-12-13 LAB — CBC WITH DIFFERENTIAL/PLATELET
Basophils Absolute: 0 10*3/uL (ref 0.0–0.1)
Basophils Relative: 0 %
EOS ABS: 0 10*3/uL (ref 0.0–0.7)
EOS PCT: 0 %
HCT: 36.9 % — ABNORMAL LOW (ref 39.0–52.0)
Hemoglobin: 12.5 g/dL — ABNORMAL LOW (ref 13.0–17.0)
LYMPHS ABS: 1.4 10*3/uL (ref 0.7–4.0)
LYMPHS PCT: 11 %
MCH: 30 pg (ref 26.0–34.0)
MCHC: 33.9 g/dL (ref 30.0–36.0)
MCV: 88.7 fL (ref 78.0–100.0)
MONO ABS: 1.3 10*3/uL — AB (ref 0.1–1.0)
MONOS PCT: 10 %
Neutro Abs: 10.3 10*3/uL — ABNORMAL HIGH (ref 1.7–7.7)
Neutrophils Relative %: 79 %
PLATELETS: 416 10*3/uL — AB (ref 150–400)
RBC: 4.16 MIL/uL — AB (ref 4.22–5.81)
RDW: 13.4 % (ref 11.5–15.5)
WBC: 13.1 10*3/uL — ABNORMAL HIGH (ref 4.0–10.5)

## 2015-12-13 LAB — I-STAT TROPONIN, ED: Troponin i, poc: 0 ng/mL (ref 0.00–0.08)

## 2015-12-13 LAB — TSH: TSH: 1.22 u[IU]/mL (ref 0.350–4.500)

## 2015-12-13 LAB — I-STAT CG4 LACTIC ACID, ED
Lactic Acid, Venous: 1.23 mmol/L (ref 0.5–1.9)
Lactic Acid, Venous: 0.73 mmol/L (ref 0.5–1.9)

## 2015-12-13 LAB — LIPASE, BLOOD: Lipase: 20 U/L (ref 11–51)

## 2015-12-13 LAB — INFLUENZA PANEL BY PCR (TYPE A & B)
INFLAPCR: NEGATIVE
Influenza B By PCR: NEGATIVE

## 2015-12-13 MED ORDER — PIPERACILLIN-TAZOBACTAM 3.375 G IVPB
3.3750 g | Freq: Three times a day (TID) | INTRAVENOUS | Status: DC
  Filled 2015-12-13 (×2): qty 50

## 2015-12-13 MED ORDER — SODIUM CHLORIDE 0.9 % IV BOLUS (SEPSIS)
1000.0000 mL | Freq: Once | INTRAVENOUS | Status: AC
  Administered 2015-12-13: 1000 mL via INTRAVENOUS

## 2015-12-13 MED ORDER — MEMANTINE HCL 10 MG PO TABS
10.0000 mg | ORAL_TABLET | Freq: Every day | ORAL | Status: DC
  Administered 2015-12-14 – 2015-12-21 (×8): 10 mg via ORAL
  Filled 2015-12-13 (×9): qty 1

## 2015-12-13 MED ORDER — ACETAMINOPHEN 650 MG RE SUPP: 650.0000 mg | Freq: Four times a day (QID) | RECTAL | Status: DC | PRN

## 2015-12-13 MED ORDER — POTASSIUM CHLORIDE CRYS ER 20 MEQ PO TBCR
40.0000 meq | EXTENDED_RELEASE_TABLET | Freq: Once | ORAL | Status: AC
  Administered 2015-12-13: 40 meq via ORAL
  Filled 2015-12-13 (×2): qty 2

## 2015-12-13 MED ORDER — ACETAMINOPHEN 325 MG PO TABS
650.0000 mg | ORAL_TABLET | Freq: Four times a day (QID) | ORAL | Status: DC | PRN
  Administered 2015-12-14 – 2015-12-21 (×5): 650 mg via ORAL
  Filled 2015-12-13 (×5): qty 2

## 2015-12-13 MED ORDER — AMLODIPINE BESYLATE 10 MG PO TABS
10.0000 mg | ORAL_TABLET | Freq: Every day | ORAL | Status: DC
  Administered 2015-12-13 – 2015-12-15 (×3): 10 mg via ORAL
  Filled 2015-12-13 (×3): qty 1

## 2015-12-13 MED ORDER — SODIUM CHLORIDE 0.9 % IV SOLN
INTRAVENOUS | Status: DC
  Administered 2015-12-13 – 2015-12-15 (×2): via INTRAVENOUS

## 2015-12-13 MED ORDER — ADULT MULTIVITAMIN W/MINERALS CH
1.0000 | ORAL_TABLET | Freq: Every day | ORAL | Status: DC
  Administered 2015-12-14 – 2015-12-21 (×8): 1 via ORAL
  Filled 2015-12-13 (×9): qty 1

## 2015-12-13 MED ORDER — VANCOMYCIN HCL 500 MG IV SOLR
500.0000 mg | Freq: Two times a day (BID) | INTRAVENOUS | Status: DC
  Filled 2015-12-13: qty 500

## 2015-12-13 MED ORDER — SODIUM CHLORIDE 0.9% FLUSH
3.0000 mL | Freq: Two times a day (BID) | INTRAVENOUS | Status: DC
  Administered 2015-12-13 – 2015-12-21 (×16): 3 mL via INTRAVENOUS

## 2015-12-13 MED ORDER — TERAZOSIN HCL 1 MG PO CAPS
1.0000 mg | ORAL_CAPSULE | Freq: Every day | ORAL | Status: DC
  Administered 2015-12-13 – 2015-12-20 (×8): 1 mg via ORAL
  Filled 2015-12-13 (×9): qty 1

## 2015-12-13 MED ORDER — ACETAMINOPHEN 500 MG PO TABS
1000.0000 mg | ORAL_TABLET | Freq: Once | ORAL | Status: AC
  Administered 2015-12-13: 1000 mg via ORAL
  Filled 2015-12-13: qty 2

## 2015-12-13 MED ORDER — PRAVASTATIN SODIUM 40 MG PO TABS
80.0000 mg | ORAL_TABLET | Freq: Every day | ORAL | Status: DC
  Administered 2015-12-13 – 2015-12-20 (×8): 80 mg via ORAL
  Filled 2015-12-13 (×8): qty 2

## 2015-12-13 MED ORDER — VANCOMYCIN HCL IN DEXTROSE 1-5 GM/200ML-% IV SOLN
1000.0000 mg | Freq: Once | INTRAVENOUS | Status: AC
  Administered 2015-12-13: 1000 mg via INTRAVENOUS
  Filled 2015-12-13: qty 200

## 2015-12-13 MED ORDER — PIPERACILLIN-TAZOBACTAM 3.375 G IVPB 30 MIN
3.3750 g | Freq: Once | INTRAVENOUS | Status: AC
  Administered 2015-12-13: 3.375 g via INTRAVENOUS
  Filled 2015-12-13: qty 50

## 2015-12-13 MED ORDER — RIVAROXABAN 20 MG PO TABS
20.0000 mg | ORAL_TABLET | Freq: Every day | ORAL | Status: DC
  Administered 2015-12-13 – 2015-12-17 (×5): 20 mg via ORAL
  Filled 2015-12-13 (×5): qty 1

## 2015-12-13 NOTE — H&P (Signed)
History and Physical    Calvin HawkingJames Redinger NWG:[id number] DOB: September 19, 1940 DOA: 12/13/2015  Referring MD/NP/PA: er PCP: Carollee HerterLALONDE,JOHN CHARLES, MD Outpatient Specialists:  Patient coming from: home  Chief Complaint: 2 week decline in health  HPI: Calvin Diaz is a 75 y.o. male with medical history significant of dementia, HTn, a fib, HLD.  Per family who was at the bedside initially but not present during my exam patient has been declining in health 2 weeks.  Patient normally can ambulate by himself but since Friday he has been unable to do so. He also now has slurred speech as well as decreased movement in his left arm.  The family did not answer the phone when I called to clarify so all history is obtained from the record.  In the ER PA evaluated the patient was found to have a rectal temp of 101.7, he had an increase in his white blood cell count. Chest x-ray was negative for pneumonia, UA was negative for infection, blood cultures have been ordered 2.  Chart review shows that the brother brought the patient on 1113 see Dr. Lynelle DoctorKnapp and he was at that time not using the left side of his body.  They also report that he fell the day before the evaluation.  He was seen by cardiology in August as well and diagnosed with atrial fibrillation. Patient was placed on several toes for 4 weeks and the plan was for DC CV. Patient ran out of Xarelto.  PCP did a head CT that showed some sub-acute infarcts.  He was re-seen by cardiology who placed him back on the Xarelto.  Review of Systems: unable to do as patient just says yes to all questions   Past Medical History:  Diagnosis Date  . Dementia   . High cholesterol   . Hypertension   . Myocardial infarction   . Stroke [facility](HCC)     Past Surgical History:  Procedure Laterality Date  . CORONARY ANGIOPLASTY       reports that he has quit smoking. His smoking use included Cigarettes. He quit smokeless tobacco use about 3 months ago. He reports that he does  not drink alcohol or use drugs.  No Known Allergies  Family History  Problem Relation Age of Onset  . Arrhythmia Mother     Had pacemaker  . Stroke Maternal Aunt      Prior to Admission medications   Medication Sig Start Date End Date Taking? Authorizing Provider  amLODipine (NORVASC) 10 MG tablet Take 1 tablet (10 mg total) by mouth daily. 09/15/15  Yes Ronnald NianJohn C Lalonde, MD  memantine (NAMENDA) 5 MG tablet Take 2 tablets (10 mg total) by mouth daily. 09/15/15  Yes Ronnald NianJohn C Lalonde, MD  Multiple Vitamin (MULTIVITAMIN WITH MINERALS) TABS tablet Take 1 tablet by mouth daily.   Yes Historical Provider, MD  pravastatin (PRAVACHOL) 80 MG tablet Take 1 tablet (80 mg total) by mouth at bedtime. 12/07/15  Yes Janetta HoraKathryn R Thompson, PA-C  rivaroxaban (XARELTO) 20 MG TABS tablet Take 1 tablet (20 mg total) by mouth daily with supper. 12/07/15  Yes Janetta HoraKathryn R Thompson, PA-C  terazosin (HYTRIN) 1 MG capsule Take 1 capsule (1 mg total) by mouth at bedtime. 11/04/15  Yes Ronnald NianJohn C Lalonde, MD  Suvorexant (BELSOMRA) 10 MG TABS Take 1 tablet by mouth daily. Patient not taking: Reported on 12/13/2015 10/11/15   Jac Canavanavid S Tysinger, PA-C    Physical Exam:     Constitutional: Elderly appearing Vitals:   12/13/15 1730 12/13/15  1800 12/13/15 1830 12/13/15 1900  BP: 117/70 121/67 123/70 125/68  Pulse: 99 100 99 93  Resp: 19 21 24 19   Temp:      TempSrc:      SpO2: 96% 96% 97% 97%   Eyes: PERRL, lids and conjunctivae normal ENMT: Mucous membranes are dry. Posterior pharynx clear of any exudate or lesions.Normal dentition.  Neck: normal, supple, no masses, no thyromegaly Respiratory: Diminished, no wheezing, normal respiratory effort  Cardiovascular: Irregular rate and rhythm, no murmurs / rubs / gallops. No extremity edema. 2+ pedal pulses. Abdomen: no tenderness, no masses palpated. No hepatosplenomegaly. Bowel sounds positive.  Musculoskeletal: no clubbing / cyanosis. No joint deformity upper and lower  extremities. Good ROM, no contractures. Normal muscle tone.  Skin: no rashes, lesions, ulcers. No induration Neurologic: Grossly weak in bilateral lower extremities, could not move left arm Psychiatric: Oriented to person only    Labs on Admission: I have personally reviewed following labs and imaging studies  CBC:  Recent Labs Lab 12/13/15 1615  WBC 13.1*  NEUTROABS 10.3*  HGB 12.5*  HCT 36.9*  MCV 88.7  PLT 416*   Basic Metabolic Panel:  Recent Labs Lab 12/13/15 1615  NA 141  K 3.0*  CL 102  CO2 29  GLUCOSE 132*  BUN 20  CREATININE 1.14  CALCIUM 9.2   GFR: Estimated Creatinine Clearance: 47.4 mL/min (by C-G formula based on SCr of 1.14 mg/dL). Liver Function Tests:  Recent Labs Lab 12/13/15 1615  AST 34  ALT 24  ALKPHOS 65  BILITOT 0.4  PROT 8.1  ALBUMIN 2.8*    Recent Labs Lab 12/13/15 1615  LIPASE 20   No results for input(s): AMMONIA in the last 168 hours. Coagulation Profile: No results for input(s): INR, PROTIME in the last 168 hours. Cardiac Enzymes: No results for input(s): CKTOTAL, CKMB, CKMBINDEX, TROPONINI in the last 168 hours. BNP (last 3 results) No results for input(s): PROBNP in the last 8760 hours. HbA1C: No results for input(s): HGBA1C in the last 72 hours. CBG: No results for input(s): GLUCAP in the last 168 hours. Lipid Profile: No results for input(s): CHOL, HDL, LDLCALC, TRIG, CHOLHDL, LDLDIRECT in the last 72 hours. Thyroid Function Tests: No results for input(s): TSH, T4TOTAL, FREET4, T3FREE, THYROIDAB in the last 72 hours. Anemia Panel: No results for input(s): VITAMINB12, FOLATE, FERRITIN, TIBC, IRON, RETICCTPCT in the last 72 hours. Urine analysis:    Component Value Date/Time   COLORURINE AMBER (A) 12/13/2015 1602   APPEARANCEUR CLEAR 12/13/2015 1602   LABSPEC 1.023 12/13/2015 1602   PHURINE 6.0 12/13/2015 1602   GLUCOSEU NEGATIVE 12/13/2015 1602   HGBUR NEGATIVE 12/13/2015 1602   BILIRUBINUR SMALL (A)  12/13/2015 1602   KETONESUR NEGATIVE 12/13/2015 1602   PROTEINUR 30 (A) 12/13/2015 1602   NITRITE NEGATIVE 12/13/2015 1602   LEUKOCYTESUR NEGATIVE 12/13/2015 1602   Sepsis Labs: Invalid input(s): PROCALCITONIN, LACTICIDVEN No results found for this or any previous visit (from the past 240 hour(s)).   Radiological Exams on Admission: Dg Chest Port 1 View  Result Date: 12/13/2015 CLINICAL DATA:  75 year old male with shortness of breath and confusion EXAM: PORTABLE CHEST 1 VIEW COMPARISON:  Prior chest x-ray 12/07/2015 FINDINGS: Cardiac and mediastinal contours remain unchanged. Atherosclerotic calcifications present in the thoracic aorta. No pulmonary edema, focal airspace consolidation, large pleural effusion or pneumothorax. Hyperinflation and central bronchitic changes are similar compared to prior. No acute osseous abnormality. IMPRESSION: 1. No active cardiopulmonary disease. 2.  Aortic Atherosclerosis (ICD10-170.0) 3. Hyperinflation  and central bronchitic changes suggest COPD. Electronically Signed   By: Malachy Moan M.D.   On: 12/13/2015 16:18      Assessment/Plan Active Problems:   History of CVA with residual deficit   Hyperlipidemia   Essential hypertension   Atrial fibrillation (HCC)   Dementia   Fever  Fever -given vanc/zosyn in ER--- hold on further abx -no source-- urine and x ray clear -blood cultures pending -monitor -r/o flu  H/o CVA, now with left sided deficits -MRI -will need CVA work up if positive -PT eval  Dementia -continue home meds  HTN/LD -continue home meds  A fib -continue xarelto  Hypokalemia -replete  DVT prophylaxis: xarelto Code Status: full Family Communication: called brother-- no answer Disposition Plan: admit to hospital on tele Consults called:  Admission status: inpt   Roselind Klus U Yassmine Tamm DO [facility] Pager [phone]  If 7PM-7AM, please contact night-coverage [email or web address] Password  [facility]  12/13/2015, 7:57 PM

## 2015-12-13 NOTE — ED Triage Notes (Addendum)
Per family pt has been in declining health x 2 weeks  ( he lives with family) and since Friday has been able to ambulate by himself, which he normmally can , now has slurred speech and pain in left arm, hand  and leg  since last night, has congested cough

## 2015-12-13 NOTE — ED Provider Notes (Signed)
  Face-to-face evaluation   History: He is here for increasing weakness, subacute, now worsening slurred speech and pain in arms and legs. He has a cough.  Physical exam: Elderly, ill-appearing. Mouth moist. Heart, irregular tachycardia. Lungs- clear anteriorly.  Medical screening examination/treatment/procedure(s) were conducted as a shared visit with non-physician practitioner(s) and myself.  I personally evaluated the patient during the encounter    Coen Miyasato WentMancel Balez, MD 12/14/15 [phone]

## 2015-12-13 NOTE — Progress Notes (Signed)
Pharmacy Antibiotic Note  Calvin Diaz is a 75 y.o. male admitted on 12/13/2015 with sepsis.  Pharmacy has been consulted for Zosyn and vancomycin dosing.  Day #1 of abx for sepsis. Tmax of 101.7, WBC 13.1. SCr 1.14, CrCl ~6845ml/min.  Plan: Give Zosyn 3.375g IV (30 min infusion) x 1, then start Zosyn 3.375 gm IV q8h (4 hour infusion) Give vancomycin 1g IV x 1, then start vancomycin 500mg  IV Q12 Monitor clinical picture, renal function, VT prn F/U C&S, abx deescalation / LOT   Temp (24hrs), Avg:99.6 F (37.6 C), Min:98 F (36.7 C), Max:101.7 F (38.7 C)   Recent Labs Lab 12/13/15 1615 12/13/15 1628  WBC 13.1*  --   LATICACIDVEN  --  1.23    Estimated Creatinine Clearance: 60.1 mL/min (by C-G formula based on SCr of 0.9 mg/dL).    No Known Allergies  Antimicrobials this admission: Zosyn 11/20 >>  Vancomycin 11/20 >>   Dose adjustments this admission: n/a  Microbiology results: 11/20 sent BCx: sent  Thank you for allowing pharmacy to be a part of this patient's care.  Armandina StammerBATCHELDER,Aundrea Horace J 12/13/2015 4:55 PM

## 2015-12-13 NOTE — ED Provider Notes (Signed)
[facility]-EMERGENCY DEPT Provider Note   CSN: [id number] Arrival date & time: 12/13/15  1445     History   Chief Complaint Chief Complaint  Patient presents with  . Weakness    PCP: Sharlot GowdaJohn Lalonde, MD  HPI Calvin Diaz is a 75 y.o. male who is bib his family for AMS. He is a level V caveat due to altered mental status and dementia. According to family patient has had several days of productive cough. He has been weak and has been confused. He is currently too weak to ambulate. Patient complaining of bilateral foot pain.   HPI  Past Medical History:  Diagnosis Date  . Dementia   . High cholesterol   . Hypertension   . Myocardial infarction   . Stroke [facility](HCC)     Patient Active Problem List   Diagnosis Date Noted  . OAB (overactive bladder) 09/20/2015  . Essential hypertension 09/07/2015  . Atrial fibrillation (HCC) 09/07/2015  . Dementia 09/07/2015  . History of CVA with residual deficit 08/18/2015  . Former smoker 08/18/2015  . History of alcohol abuse 08/18/2015  . Hyperlipidemia 08/18/2015    Past Surgical History:  Procedure Laterality Date  . CORONARY ANGIOPLASTY         Home Medications    Prior to Admission medications   Medication Sig Start Date End Date Taking? Authorizing Provider  amLODipine (NORVASC) 10 MG tablet Take 1 tablet (10 mg total) by mouth daily. 09/15/15   Ronnald NianJohn C Lalonde, MD  Chlorpheniramine-DM (CORICIDIN HBP COUGH/COLD PO) Take 1 tablet by mouth every 12 (twelve) hours as needed (cough/congestion).     Historical Provider, MD  memantine (NAMENDA) 5 MG tablet Take 2 tablets (10 mg total) by mouth daily. 09/15/15   Ronnald NianJohn C Lalonde, MD  Multiple Vitamin (MULTIVITAMIN WITH MINERALS) TABS tablet Take 1 tablet by mouth daily.    Historical Provider, MD  pravastatin (PRAVACHOL) 80 MG tablet Take 1 tablet (80 mg total) by mouth at bedtime. 12/07/15   Janetta HoraKathryn R Thompson, PA-C  rivaroxaban (XARELTO) 20 MG TABS tablet Take 1 tablet (20 mg total) by  mouth daily with supper. 12/07/15   Janetta HoraKathryn R Thompson, PA-C  Suvorexant (BELSOMRA) 10 MG TABS Take 1 tablet by mouth daily. 10/11/15   Kermit Baloavid S Tysinger, PA-C  terazosin (HYTRIN) 1 MG capsule Take 1 capsule (1 mg total) by mouth at bedtime. 11/04/15   Ronnald NianJohn C Lalonde, MD    Family History Family History  Problem Relation Age of Onset  . Arrhythmia Mother     Had pacemaker  . Stroke Maternal Aunt     Social History Social History  Substance Use Topics  . Smoking status: Former Smoker    Types: Cigarettes  . Smokeless tobacco: Former NeurosurgeonUser    Quit date: 08/18/2015  . Alcohol use No     Comment: Previously daily     Allergies   Patient has no known allergies.   Review of Systems Review of Systems Ten systems reviewed and are negative for acute change, except as noted in the HPI.    Physical Exam Updated Vital Signs BP 117/70   Pulse 99   Temp 101.7 F (38.7 C) (Rectal)   Resp 19   SpO2 96%   Physical Exam  Constitutional: He appears well-developed and well-nourished. No distress.  Appears sick  HENT:  Head: Normocephalic and atraumatic.  Eyes: Conjunctivae and EOM are normal. Pupils are equal, round, and reactive to light. No scleral icterus.  Neck: Normal range of  motion. Neck supple.  Cardiovascular: Normal rate, regular rhythm and normal heart sounds.   Pulmonary/Chest: Effort normal and breath sounds normal. No respiratory distress.  Abdominal: Soft. There is no tenderness.  Musculoskeletal: He exhibits no edema.  TTP BL feet no signs of infection  Neurological: He is alert.  Sleepy but arousable. Confused speech without slurring  Skin: Skin is dry. He is not diaphoretic.  Hot to touch  Psychiatric: His behavior is normal.  Nursing note and vitals reviewed.    ED Treatments / Results  Labs (all labs ordered are listed, but only abnormal results are displayed) Labs Reviewed  CBC WITH DIFFERENTIAL/PLATELET - Abnormal; Notable for the following:        Result Value   WBC 13.1 (*)    RBC 4.16 (*)    Hemoglobin 12.5 (*)    HCT 36.9 (*)    Platelets 416 (*)    Neutro Abs 10.3 (*)    Monocytes Absolute 1.3 (*)    All other components within normal limits  COMPREHENSIVE METABOLIC PANEL - Abnormal; Notable for the following:    Potassium 3.0 (*)    Glucose, Bld 132 (*)    Albumin 2.8 (*)    All other components within normal limits  URINALYSIS, ROUTINE W REFLEX MICROSCOPIC (NOT AT [facility]) - Abnormal; Notable for the following:    Color, Urine AMBER (*)    Bilirubin Urine SMALL (*)    Protein, ur 30 (*)    All other components within normal limits  URINE MICROSCOPIC-ADD ON - Abnormal; Notable for the following:    Bacteria, UA RARE (*)    Casts HYALINE CASTS (*)    All other components within normal limits  CULTURE, BLOOD (ROUTINE X 2)  CULTURE, BLOOD (ROUTINE X 2)  LIPASE, BLOOD  INFLUENZA PANEL BY PCR (TYPE A & B, H1N1)  I-STAT TROPOININ, ED  I-STAT CG4 LACTIC ACID, ED  I-STAT CG4 LACTIC ACID, ED    EKG  EKG Interpretation  Date/Time:  Monday December 13 2015 14:48:52 EST Ventricular Rate:  109 PR Interval:    QRS Duration: 95 QT Interval:  405 QTC Calculation: 538 R Axis:   54 Text Interpretation:  indeterminate rhythym Artifact Atrial premature complex Anteroseptal infarct, age indeterminate Prolonged QT interval, longer than prior Confirmed by [facility]  MD, ELLIOTT [phone]) on 12/13/2015 4:53:55 PM       Radiology Dg Chest Port 1 View  Result Date: 12/13/2015 CLINICAL DATA:  75 year old male with shortness of breath and confusion EXAM: PORTABLE CHEST 1 VIEW COMPARISON:  Prior chest x-ray 12/07/2015 FINDINGS: Cardiac and mediastinal contours remain unchanged. Atherosclerotic calcifications present in the thoracic aorta. No pulmonary edema, focal airspace consolidation, large pleural effusion or pneumothorax. Hyperinflation and central bronchitic changes are similar compared to prior. No acute osseous abnormality.  IMPRESSION: 1. No active cardiopulmonary disease. 2.  Aortic Atherosclerosis (ICD10-170.0) 3. Hyperinflation and central bronchitic changes suggest COPD. Electronically Signed   By: Malachy Moan M.D.   On: 12/13/2015 16:18    Procedures Procedures (including critical care time)  Medications Ordered in ED Medications  vancomycin (VANCOCIN) IVPB 1000 mg/200 mL premix (1,000 mg Intravenous New Bag/Given 12/13/15 1717)  piperacillin-tazobactam (ZOSYN) IVPB 3.375 g (not administered)  vancomycin (VANCOCIN) 500 mg in sodium chloride 0.9 % 100 mL IVPB (not administered)  acetaminophen (TYLENOL) tablet 1,000 mg (1,000 mg Oral Given 12/13/15 1709)  sodium chloride 0.9 % bolus 1,000 mL (1,000 mLs Intravenous New Bag/Given 12/13/15 1709)  piperacillin-tazobactam (ZOSYN) IVPB 3.375  g (3.375 g Intravenous New Bag/Given 12/13/15 1717)     Initial Impression / Assessment and Plan / ED Course  I have reviewed the triage vital signs and the nursing notes.  Pertinent labs & imaging results that were available during my care of the patient were reviewed by me and considered in my medical decision making (see chart for details).  Clinical Course as of Dec 13 1755  [facility]  1644 Rectal temp 101.7 Febrile with increased wbc  Will obtain blood culture   [AH]    Clinical Course User Index [AH] Arthor CaptainAbigail Raylan Hanton, PA-C    Patient with sirs, ? Flu. I did not find any slurred speech or new neuro deficits on my exam Will be admitted to the hospitalist service by dr. Benjamine MolaVann Final Clinical Impressions(s) / ED Diagnoses   Final diagnoses:  SIRS (systemic inflammatory response syndrome) (HCC)  Delirium    New Prescriptions New Prescriptions   No medications on file     Arthor Captainbigail Laymond Postle, PA-C 12/13/15 1822    Mancel BaleElliott Wentz, MD 12/14/15 [phone]

## 2015-12-13 NOTE — ED Notes (Signed)
MD at bedside. 

## 2015-12-14 ENCOUNTER — Inpatient Hospital Stay (HOSPITAL_COMMUNITY): Payer: Medicare Other

## 2015-12-14 ENCOUNTER — Encounter (HOSPITAL_COMMUNITY): Payer: Self-pay | Admitting: General Practice

## 2015-12-14 DIAGNOSIS — E876 Hypokalemia: Secondary | ICD-10-CM | POA: Diagnosis present

## 2015-12-14 DIAGNOSIS — R6510 Systemic inflammatory response syndrome (SIRS) of non-infectious origin without acute organ dysfunction: Secondary | ICD-10-CM | POA: Diagnosis present

## 2015-12-14 DIAGNOSIS — I639 Cerebral infarction, unspecified: Secondary | ICD-10-CM | POA: Diagnosis present

## 2015-12-14 DIAGNOSIS — I6300 Cerebral infarction due to thrombosis of unspecified precerebral artery: Secondary | ICD-10-CM

## 2015-12-14 DIAGNOSIS — F0150 Vascular dementia without behavioral disturbance: Secondary | ICD-10-CM

## 2015-12-14 LAB — CBC
HCT: 34.3 % — ABNORMAL LOW (ref 39.0–52.0)
Hemoglobin: 11.4 g/dL — ABNORMAL LOW (ref 13.0–17.0)
MCH: 29.8 pg (ref 26.0–34.0)
MCHC: 33.2 g/dL (ref 30.0–36.0)
MCV: 89.6 fL (ref 78.0–100.0)
PLATELETS: 403 10*3/uL — AB (ref 150–400)
RBC: 3.83 MIL/uL — ABNORMAL LOW (ref 4.22–5.81)
RDW: 13.8 % (ref 11.5–15.5)
WBC: 11.1 10*3/uL — AB (ref 4.0–10.5)

## 2015-12-14 LAB — BASIC METABOLIC PANEL
Anion gap: 8 (ref 5–15)
BUN: 14 mg/dL (ref 6–20)
CO2: 28 mmol/L (ref 22–32)
CREATININE: 0.87 mg/dL (ref 0.61–1.24)
Calcium: 8.8 mg/dL — ABNORMAL LOW (ref 8.9–10.3)
Chloride: 105 mmol/L (ref 101–111)
GFR calc Af Amer: >60 mL/min (ref >60)
Glucose, Bld: 114 mg/dL — ABNORMAL HIGH (ref 65–99)
Potassium: 3.1 mmol/L — ABNORMAL LOW (ref 3.5–5.1)
SODIUM: 141 mmol/L (ref 135–145)

## 2015-12-14 LAB — TROPONIN I
TROPONIN I: 0.03 ng/mL — AB (ref <0.03)
Troponin I: 0.03 ng/mL (ref <0.03)

## 2015-12-14 MED ORDER — POTASSIUM CHLORIDE CRYS ER 20 MEQ PO TBCR
40.0000 meq | EXTENDED_RELEASE_TABLET | ORAL | Status: AC
  Administered 2015-12-14 (×2): 40 meq via ORAL
  Filled 2015-12-14 (×2): qty 2

## 2015-12-14 NOTE — Evaluation (Signed)
Speech Language Pathology Evaluation Patient Details Name: Calvin Diaz MRN: [id number] DOB: 30-Aug-1940 Today's Date: 12/14/2015 Time: [id number] SLP Time Calculation (min) (ACUTE ONLY): 31 min  Problem List:  Patient Active Problem List   Diagnosis Date Noted  . Acute CVA (cerebrovascular accident) (HCC) 12/14/2015  . SIRS (systemic inflammatory response syndrome) (HCC) 12/14/2015  . Hypokalemia 12/14/2015  . Fever 12/13/2015  . OAB (overactive bladder) 09/20/2015  . Essential hypertension 09/07/2015  . Atrial fibrillation (HCC) 09/07/2015  . Dementia 09/07/2015  . History of CVA with residual deficit 08/18/2015  . Former smoker 08/18/2015  . History of alcohol abuse 08/18/2015  . Hyperlipidemia 08/18/2015   Past Medical History:  Past Medical History:  Diagnosis Date  . Dementia   . High cholesterol   . Hypertension   . Myocardial infarction   . Stroke [facility](HCC)    Past Surgical History:  Past Surgical History:  Procedure Laterality Date  . CORONARY ANGIOPLASTY     HPI:  Calvin Diaz a 75 y.o.malewith medical history significant of dementia, HTn, a fib, HLD, prior right CVA with residual deficits, details not provided. Per family upon admission, patient has been declining in health 2 weeks. Patient normally can ambulate by himself but since Friday he has been unable to do so. He also now has slurred speech as well as decreased movement in his left arm. Found to have a rectal temp of 101.7, he had an increase in his white blood cell count. Chest x-ray was negative for pneumonia, UA was negative for infection,blood cultures have been ordered 2. MRI shows new infarct in right parietal lobe, in same area as prior CVA.    Assessment / Plan / Recommendation Clinical Impression  Pt demonstrates moderate to severe cognitive impairment, likely worsened from baseline dementia. Pt is inattentive to left visual field, needs moderate cues to attend to functional tasks on the  left, is not aware of left sided deficits without max cueing. Pts ability to particiapte in even basic functional tasks is impacted by lack of initaition and only briefly focused to sustained attention. For example, pt needs verbal reminders to feed himself, continue chewing etc. He cannot use the call bell due to only utilizing button on the right side of the remote. His langauge is intact and speech is only mildly slurred, he is intelligible. [facility] given acute impairment due to CVA with potential for family to assist at d/c with training and education. Recommend acute SLP to address fucntional cognition during admit, consider CIR at d/c.     SLP Assessment  Patient needs continued Speech Lanaguage Pathology Services    Follow Up Recommendations  Inpatient Rehab    Frequency and Duration min 2x/week  2 weeks      SLP Evaluation Cognition  Overall Cognitive Status: Impaired/Different from baseline Arousal/Alertness: Awake/alert Orientation Level: Oriented to person;Oriented to place;Disoriented to place;Disoriented to time Attention: Focused;Sustained Focused Attention: Impaired Focused Attention Impairment: Verbal basic;Functional basic Sustained Attention: Impaired Sustained Attention Impairment: Verbal basic;Functional basic Memory: Impaired Memory Impairment: Storage deficit Awareness: Impaired Awareness Impairment: Intellectual impairment;Emergent impairment;Anticipatory impairment Problem Solving: Impaired Problem Solving Impairment: Functional basic;Verbal basic Executive Function: Initiating Initiating: Impaired Initiating Impairment: Functional basic Safety/Judgment: Impaired       Comprehension  Auditory Comprehension Overall Auditory Comprehension: Appears within functional limits for tasks assessed Interfering Components: Attention;Visual impairments    Expression Verbal Expression Overall Verbal Expression: Appears within functional limits  for tasks assessed   Oral / Motor  Oral Motor/Sensory Function Overall Oral Motor/Sensory Function: Moderate impairment Facial ROM: Reduced left;Suspected CN VII (facial) dysfunction Facial Symmetry: Abnormal symmetry left;Suspected CN VII (facial) dysfunction Facial Strength: Reduced left;Suspected CN VII (facial) dysfunction Lingual ROM: Within Functional Limits Lingual Symmetry: Within Functional Limits Lingual Strength: Within Functional Limits Velum: Within Functional Limits Mandible: Within Functional Limits Motor Speech Overall Motor Speech: Impaired Respiration: Within functional limits Phonation: Normal Articulation: Impaired Level of Impairment: Phrase Intelligibility: Intelligible Motor Planning: Witnin functional limits Motor Speech Errors: Consistent   GO                   Calvin DittyBonnie Artur Winningham, MA CCC-SLP [phone]  Calvin Diaz, Calvin Diaz 12/14/2015, 10:19 AM

## 2015-12-14 NOTE — Progress Notes (Signed)
PROGRESS NOTE    Calvin Diaz  ZOX:[id number] DOB: 07-Dec-1940 DOA: 12/13/2015 PCP: Carollee Herter, MD     Brief Narrative:  Calvin Diaz is a 75 y.o. male with medical history significant of dementia, HTN, a fib, HLD, previous stroke with residual left arm weakness. Per family, patient has been declining in health 2 weeks. Patient normally can ambulate by himself but since Friday he has been unable to do so. He also now has slurred speech as well. He has been admitted for further work up.   Assessment & Plan:   Principal Problem:   Acute CVA (cerebrovascular accident) [facility]) Active Problems:   History of CVA with residual deficit   Hyperlipidemia   Essential hypertension   Atrial fibrillation (HCC)   Dementia   Fever   SIRS (systemic inflammatory response syndrome) (HCC)   Hypokalemia  Acute CVA along right postcentral gyrus   -Previous stroke in May 2017 has left residual weakness in left arm, but leg weakness/slurred speech/right facial droop is new. MRI obtained which showed small area of acute ischemia superimposed on remote right MCA infarct along right postcentral gyrus.  -Neurology consulted, will await further recommendations for work up.   -Neuro checks  -PT/OT/SLP   SIRS, without source -Fever 101.7, tachycardic, tachypneic, leukocytosis on admission. UA and CXR unremarkable, influenza negative, blood cultures pending. Given vanc/zosyn in ER, will hold for now as no source found. Now afebrile, leukocytosis resolved, VS improving. Continue to monitor.   Dementia -Wife states he has short-term memory loss, is oriented to place and self at baseline. Continue namenda   HTN -Continue norvasc  HLD -Continue pravachol   Chronic A fib -Continue xarelto -Cardiac monitor   Hypokalemia -Replaced. Trend BMP, Mg    DVT prophylaxis: Xarelto Code Status: Full Family Communication: Spoke with wife over the phone today   Disposition Plan: pending further  workup, home vs SNF    Consultants:   Neurology   Procedures:   None  Antimicrobials:   None    Subjective: Patient has dementia at baseline and is overall a poor historian. He is oriented to self and place only. He is able to tell me that he cannot move his left arm but no other history. No other complaints.   Objective: Vitals:   12/13/15 2018 12/13/15 2046 12/13/15 2151 12/14/15 0610  BP:  119/61 128/72 108/62  Pulse:  97  94  Resp:  20  (!) 22  Temp: 99.1 F (37.3 C) 98.9 F (37.2 C)  97.9 F (36.6 C)  TempSrc: Oral Oral  Oral  SpO2:  94%  97%    Intake/Output Summary (Last 24 hours) at 12/14/15 0951 Last data filed at 12/14/15 0604  Gross per 24 hour  Intake           668.33 ml  Output                0 ml  Net           668.33 ml   There were no vitals filed for this visit.  Examination:  General exam: Appears calm and comfortable  Respiratory system: Clear to auscultation. Respiratory effort normal. Cardiovascular system: S1 & S2 heard, RRR. No JVD, murmurs, rubs, gallops or clicks. No pedal edema. Gastrointestinal system: Abdomen is nondistended, soft and nontender. No organomegaly or masses felt. Normal bowel sounds heard. Central nervous system: Alert and oriented to self and place only, not to time or history. Left UE paresis strength 2/5, bilateral LE  weakness strength 3/5. +Right facial droop. EOMI.  Skin: No rashes, lesions or ulcers  Data Reviewed: I have personally reviewed following labs and imaging studies  CBC:  Recent Labs Lab 12/13/15 1615 12/14/15 0250  WBC 13.1* 11.1*  NEUTROABS 10.3*  --   HGB 12.5* 11.4*  HCT 36.9* 34.3*  MCV 88.7 89.6  PLT 416* 403*   Basic Metabolic Panel:  Recent Labs Lab 12/13/15 1615 12/14/15 0250  NA 141 141  K 3.0* 3.1*  CL 102 105  CO2 29 28  GLUCOSE 132* 114*  BUN 20 14  CREATININE 1.14 0.87  CALCIUM 9.2 8.8*   GFR: Estimated Creatinine Clearance: 62.2 mL/min (by C-G formula based on SCr  of 0.87 mg/dL). Liver Function Tests:  Recent Labs Lab 12/13/15 1615  AST 34  ALT 24  ALKPHOS 65  BILITOT 0.4  PROT 8.1  ALBUMIN 2.8*    Recent Labs Lab 12/13/15 1615  LIPASE 20   No results for input(s): AMMONIA in the last 168 hours. Coagulation Profile: No results for input(s): INR, PROTIME in the last 168 hours. Cardiac Enzymes:  Recent Labs Lab 12/13/15 2102 12/14/15 0250  TROPONINI 0.03* 0.03*   BNP (last 3 results) No results for input(s): PROBNP in the last 8760 hours. HbA1C: No results for input(s): HGBA1C in the last 72 hours. CBG: No results for input(s): GLUCAP in the last 168 hours. Lipid Profile: No results for input(s): CHOL, HDL, LDLCALC, TRIG, CHOLHDL, LDLDIRECT in the last 72 hours. Thyroid Function Tests:  Recent Labs  12/13/15 1615  TSH 1.220   Anemia Panel: No results for input(s): VITAMINB12, FOLATE, FERRITIN, TIBC, IRON, RETICCTPCT in the last 72 hours. Sepsis Labs:  Recent Labs Lab 12/13/15 1628 12/13/15 1931  LATICACIDVEN 1.23 0.73    No results found for this or any previous visit (from the past 240 hour(s)).     Radiology Studies: Mr Brain Wo Contrast  Result Date: 12/14/2015 CLINICAL DATA:  Declining health for 2 weeks. Difficulty ambulating. Slurred speech. Decreased movement in left arm. EXAM: MRI HEAD WITHOUT CONTRAST TECHNIQUE: Multiplanar, multiecho pulse sequences of the brain and surrounding structures were obtained without intravenous contrast. COMPARISON:  Head CT 12/07/2015 FINDINGS: Despite efforts by the technologist and patient, motion artifact is present on today's examination and could not be eliminated. This reduces the sensitivity and specificity of the study. There is a small area of diffusion restriction within the right parietal lobe, which is in the region of encephalomalacia associated with prior large right MCA infarct. There is also extensive left frontal encephalomalacia and right occipital  encephalomalacia. No midline shift or other significant mass effect. There is ex vacuo dilatation of the ventricles. No extra-axial collection. IMPRESSION: 1. Severely motion degraded examination. 2. Within the above limitation, there is a small area of acute ischemia superimposed on the area of encephalomalacia associated with the remote right MCA infarct. This is along the right postcentral gyrus. 3. Large areas of encephalomalacia in the right MCA territory and left frontal lobe. Electronically Signed   By: Deatra RobinsonKevin  Herman M.D.   On: 12/14/2015 05:26   Dg Chest Port 1 View  Result Date: 12/13/2015 CLINICAL DATA:  75 year old male with shortness of breath and confusion EXAM: PORTABLE CHEST 1 VIEW COMPARISON:  Prior chest x-ray 12/07/2015 FINDINGS: Cardiac and mediastinal contours remain unchanged. Atherosclerotic calcifications present in the thoracic aorta. No pulmonary edema, focal airspace consolidation, large pleural effusion or pneumothorax. Hyperinflation and central bronchitic changes are similar compared to prior. No acute  osseous abnormality. IMPRESSION: 1. No active cardiopulmonary disease. 2.  Aortic Atherosclerosis (ICD10-170.0) 3. Hyperinflation and central bronchitic changes suggest COPD. Electronically Signed   By: Malachy MoanHeath  McCullough M.D.   On: 12/13/2015 16:18      Scheduled Meds: . amLODipine  10 mg Oral Daily  . memantine  10 mg Oral Daily  . multivitamin with minerals  1 tablet Oral Daily  . potassium chloride  40 mEq Oral Q4H  . pravastatin  80 mg Oral QHS  . rivaroxaban  20 mg Oral Q supper  . sodium chloride flush  3 mL Intravenous Q12H  . terazosin  1 mg Oral QHS   Continuous Infusions: . sodium chloride 50 mL/hr at 12/13/15 2118     LOS: 1 day    Time spent: 40 minutes   Noralee StainJennifer Vanna Shavers, DO [facility] [email or web address] Password [email or web address] 12/14/2015, 9:51 AM

## 2015-12-14 NOTE — Consult Note (Signed)
Requesting Physician: Dr. Alvino Chapel    Chief Complaint: Stroke found on MRI  History obtained from:  Chart    HPI:                                                                                                                                         Calvin Diaz is an 75 y.o. male (per chart)  medical history significant of dementia, HTn, a fib, HLD.  Per family who was at the bedside initially but not present during my exam patient has been declining in health 2 weeks.  Patient normally can ambulate by himself but since Friday he has been unable to do so. He also now has slurred speech as well as decreased movement in his left arm.   on arrival patient was found to have a temperature of 101.7 and is currently being worked up for SIRS. Blood cultures pending. 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 Patient is currently on Xarelto. Neurology was asked to see patient secondary to incidental infarct on MRI.  The patient just came back from having a swallow screen. Patient is very drowsy and falls asleep multiple times during interview and also during physical exam. Patient has baseline dementia he is rarely using [place] but cannot tell me what kind of building, what year, what month it is. Patient has difficulty following commands secondary to being both drowsy and also having significant pain in his left side. No histories able to be obtained from him.  Date last known well: Unable to determine Time last known well: Unable to determine tPA Given: No: On anticoagulant    Past Medical History:  Diagnosis Date  . Dementia   . High cholesterol   . Hypertension   . Myocardial infarction   . Stroke [facility])     Past Surgical History:  Procedure Laterality Date  . CORONARY ANGIOPLASTY      Family History  Problem Relation Age of Onset  . Arrhythmia Mother     Had pacemaker  . Stroke Maternal Aunt    Social History:  reports that he has quit  smoking. His smoking use included Cigarettes. He quit smokeless tobacco use about 3 months ago. He reports that he does not drink alcohol or use drugs.  Allergies: No Known Allergies  Medications:  Prior to Admission:  Prescriptions Prior to Admission  Medication Sig Dispense Refill Last Dose  . amLODipine (NORVASC) 10 MG tablet Take 1 tablet (10 mg total) by mouth daily. 90 tablet 3 12/12/2015 at pm  . memantine (NAMENDA) 5 MG tablet Take 2 tablets (10 mg total) by mouth daily. 90 tablet 3 12/13/2015 at am  . Multiple Vitamin (MULTIVITAMIN WITH MINERALS) TABS tablet Take 1 tablet by mouth daily.   12/13/2015 at am  . pravastatin (PRAVACHOL) 80 MG tablet Take 1 tablet (80 mg total) by mouth at bedtime. 90 tablet 3 Past Month at Unknown time  . rivaroxaban (XARELTO) 20 MG TABS tablet Take 1 tablet (20 mg total) by mouth daily with supper. 90 tablet 3 12/12/2015 at pm  . terazosin (HYTRIN) 1 MG capsule Take 1 capsule (1 mg total) by mouth at bedtime. 30 capsule 5 12/12/2015 at pm  . Suvorexant (BELSOMRA) 10 MG TABS Take 1 tablet by mouth daily. (Patient not taking: Reported on 12/13/2015) 30 tablet 0 Not Taking at Unknown time   Scheduled: . amLODipine  10 mg Oral Daily  . memantine  10 mg Oral Daily  . multivitamin with minerals  1 tablet Oral Daily  . potassium chloride  40 mEq Oral Q4H  . pravastatin  80 mg Oral QHS  . rivaroxaban  20 mg Oral Q supper  . sodium chloride flush  3 mL Intravenous Q12H  . terazosin  1 mg Oral QHS    ROS:                                                                                                                                       History obtained from unobtainable from patient due to mental status    Neurologic Examination:                                                                                                       Blood pressure (!) 146/54, pulse 94, temperature 97.9 F (36.6 C), temperature source Oral, resp. rate (!) 22, SpO2 97 %.  HEENT-  Normocephalic, no lesions, without obvious abnormality.  Normal external eye and conjunctiva.  Normal TM's bilaterally.  Normal auditory canals and external ears. Normal external nose, mucus membranes and septum.  Normal pharynx. Cardiovascular- irregularly irregular rhythm, pulses palpable throughout   Lungs- chest clear, no wheezing, rales, normal symmetric air entry Abdomen- normal findings: bowel sounds normal Extremities- no edema Lymph-no adenopathy palpable Musculoskeletal-no joint tenderness, deformity or swelling  Skin-warm and dry, no hyperpigmentation, vitiligo, or suspicious lesions  Neurological Examination Mental Status: Patient is very drowsy and falls asleep easily. Patient can follow only simple commands even then needs to be redirected. Minimal focality or when speaking appears not to have any aphasia but does have dysarthria Cranial Nerves: II: Visual fields are grossly intact on the left and right lower field I question a right upper field cut however patient was having a hard time following directions III,IV, VI: ptosis not present, extra-ocular motions intact bilaterally, pupils equal, round, reactive to light and accommodation. Patient appears to be neglecting his left side and focuses on looking at the right side of the room but there is no gaze deviation V,VII: smile symmetric, facial light touch sensation normal bilaterally VIII: hearing normal bilaterally IX,X: uvula rises symmetrically XI: bilateral shoulder shrug XII: midline tongue extension Motor: Patient has 5/5 strength in his right upper extremity, 2/5 strength with increased rigidity and spasticity of the left upper extremity, patient can wiggle his toes but has significant increased tone on his left lower extremity, 3/5 strength on his right lower extremity. It should be noted  the patient has significant pain when moving left upper and left lower extremity. Sensory: Pinprick and light touch intact throughout upper extremities bilaterally and lower extremity is however he does neglect his left lower extremity to double simultaneous stimuli. It should be also reminded that this patient is severely demented and lethargic thus may have a hard time with sensation. Deep Tendon Reflexes: 1+ and symmetric throughout Plantars: Right: downgoing   Left: downgoing Cerebellar: normal finger-to-nose with his right arm however he does have an action tremor. Unable to assess on his left arm. Unable to assess bilateral lower extremities for heel-to-shin secondary to weakness and increased tone on the left Gait: Not tested       Lab Results: Basic Metabolic Panel:  Recent Labs Lab 12/13/15 1615 12/14/15 0250  NA 141 141  K 3.0* 3.1*  CL 102 105  CO2 29 28  GLUCOSE 132* 114*  BUN 20 14  CREATININE 1.14 0.87  CALCIUM 9.2 8.8*    Liver Function Tests:  Recent Labs Lab 12/13/15 1615  AST 34  ALT 24  ALKPHOS 65  BILITOT 0.4  PROT 8.1  ALBUMIN 2.8*    Recent Labs Lab 12/13/15 1615  LIPASE 20   No results for input(s): AMMONIA in the last 168 hours.  CBC:  Recent Labs Lab 12/13/15 1615 12/14/15 0250  WBC 13.1* 11.1*  NEUTROABS 10.3*  --   HGB 12.5* 11.4*  HCT 36.9* 34.3*  MCV 88.7 89.6  PLT 416* 403*    Cardiac Enzymes:  Recent Labs Lab 12/13/15 2102 12/14/15 0250  TROPONINI 0.03* 0.03*    Lipid Panel: No results for input(s): CHOL, TRIG, HDL, CHOLHDL, VLDL, LDLCALC in the last 168 hours.  CBG: No results for input(s): GLUCAP in the last 168 hours.  Microbiology: Results for orders placed or performed during the hospital encounter of 12/13/15  Culture, blood (Routine X 2) w Reflex to ID Panel     Status: None (Preliminary result)   Collection Time: 12/13/15  5:07 PM  Result Value Ref Range Status   Specimen Description BLOOD LEFT  FOREARM  Final   Special Requests BOTTLES DRAWN AEROBIC AND ANAEROBIC 5CC  Final   Culture NO GROWTH < 24 HOURS  Final   Report Status PENDING  Incomplete  Culture, blood (Routine X 2) w Reflex to ID Panel  Status: None (Preliminary result)   Collection Time: 12/13/15  5:07 PM  Result Value Ref Range Status   Specimen Description BLOOD RIGHT FOREARM  Final   Special Requests BOTTLES DRAWN AEROBIC AND ANAEROBIC 5CC  Final   Culture NO GROWTH < 24 HOURS  Final   Report Status PENDING  Incomplete    Coagulation Studies: No results for input(s): LABPROT, INR in the last 72 hours.  Imaging: Mr Brain Wo Contrast  Result Date: 12/14/2015 CLINICAL DATA:  Declining health for 2 weeks. Difficulty ambulating. Slurred speech. Decreased movement in left arm. EXAM: MRI HEAD WITHOUT CONTRAST TECHNIQUE: Multiplanar, multiecho pulse sequences of the brain and surrounding structures were obtained without intravenous contrast. COMPARISON:  Head CT 12/07/2015 FINDINGS: Despite efforts by the technologist and patient, motion artifact is present on today's examination and could not be eliminated. This reduces the sensitivity and specificity of the study. There is a small area of diffusion restriction within the right parietal lobe, which is in the region of encephalomalacia associated with prior large right MCA infarct. There is also extensive left frontal encephalomalacia and right occipital encephalomalacia. No midline shift or other significant mass effect. There is ex vacuo dilatation of the ventricles. No extra-axial collection. IMPRESSION: 1. Severely motion degraded examination. 2. Within the above limitation, there is a small area of acute ischemia superimposed on the area of encephalomalacia associated with the remote right MCA infarct. This is along the right postcentral gyrus. 3. Large areas of encephalomalacia in the right MCA territory and left frontal lobe. Electronically Signed   By: Deatra Robinson  M.D.   On: 12/14/2015 05:26   Dg Chest Port 1 View  Result Date: 12/13/2015 CLINICAL DATA:  75 year old male with shortness of breath and confusion EXAM: PORTABLE CHEST 1 VIEW COMPARISON:  Prior chest x-ray 12/07/2015 FINDINGS: Cardiac and mediastinal contours remain unchanged. Atherosclerotic calcifications present in the thoracic aorta. No pulmonary edema, focal airspace consolidation, large pleural effusion or pneumothorax. Hyperinflation and central bronchitic changes are similar compared to prior. No acute osseous abnormality. IMPRESSION: 1. No active cardiopulmonary disease. 2.  Aortic Atherosclerosis (ICD10-170.0) 3. Hyperinflation and central bronchitic changes suggest COPD. Electronically Signed   By: Malachy Moan M.D.   On: 12/13/2015 16:18   Dg Swallowing Func-speech Pathology  Result Date: 12/14/2015 Objective Swallowing Evaluation: Type of Study: MBS-Modified Barium Swallow Study Patient Details Name: Derril Franek MRN: [id number] Date of Birth: [birth year] Today's Date: 12/14/2015 Time: SLP Start Time (ACUTE ONLY): 1030-SLP Stop Time (ACUTE ONLY): 1045 SLP Time Calculation (min) (ACUTE ONLY): 15 min Past Medical History: Past Medical History: Diagnosis Date . Dementia  . High cholesterol  . Hypertension  . Myocardial infarction  . Stroke [facility])  Past Surgical History: Past Surgical History: Procedure Laterality Date . CORONARY ANGIOPLASTY   HPI: Freddy Kinne a 75 y.o.malewith medical history significant of dementia, HTn, a fib, HLD, prior right CVA with residual deficits, details not provided. Per family upon admission, patient has been declining in health 2 weeks. Patient normally can ambulate by himself but since Friday he has been unable to do so. He also now has slurred speech as well as decreased movement in his left arm. Found to have a rectal temp of 101.7, he had an increase in his white blood cell count. Chest x-ray was negative for pneumonia, UA was negative for  infection,blood cultures have been ordered 2. MRI shows new infarct in right parietal lobe, in same area as prior CVA.  No Data Recorded Assessment /  Plan / Recommendation CHL IP CLINICAL IMPRESSIONS 12/14/2015 Therapy Diagnosis Moderate oral phase dysphagia;Moderate pharyngeal phase dysphagia Clinical Impression Pt demosntrates moderate dysphagia, mostly attributable to cognitive impariment.   Mastication is prolonged, verbal cues needed for transit. Pt orally holds all boluses and struggles to initaite swallow response. Verbal cues and visual cues to give next bolus aid in swallow initiation. Regardless, all boluses reach the pyriform sinuses prior to intiation. WIth upright posture there is no penetration or aspiration observed, no residual. Pt is recommended to consume a dys 2/thin liquid diet with full supervision for verbal cueing and assist. Pt is at risk for aspiration if posture is suboptimal and also at risk for poor nutrition/hydration given lack of initiation. Will follow for swallowing and cognitive therapy.    Impact on safety and function Moderate aspiration risk;Risk for inadequate nutrition/hydration   CHL IP TREATMENT RECOMMENDATION 12/14/2015 Treatment Recommendations Therapy as outlined in treatment plan below   Prognosis 12/14/2015 Prognosis for Safe Diet Advancement Fair Barriers to Reach Goals Cognitive deficits Barriers/Prognosis Comment -- CHL IP DIET RECOMMENDATION 12/14/2015 SLP Diet Recommendations Dysphagia 2 (Fine chop) solids;Thin liquid Liquid Administration via Cup;Straw Medication Administration Crushed with puree Compensations Minimize environmental distractions;Other (Comment) Postural Changes Seated upright at 90 degrees   CHL IP OTHER RECOMMENDATIONS 12/14/2015 Recommended Consults -- Oral Care Recommendations Oral care BID Other Recommendations Have oral suction available   CHL IP FOLLOW UP RECOMMENDATIONS 12/14/2015 Follow up Recommendations Inpatient Rehab   CHL IP  FREQUENCY AND DURATION 12/14/2015 Speech Therapy Frequency (ACUTE ONLY) min 2x/week Treatment Duration 2 weeks      CHL IP ORAL PHASE 12/14/2015 Oral Phase Impaired Oral - Pudding Teaspoon -- Oral - Pudding Cup -- Oral - Honey Teaspoon -- Oral - Honey Cup -- Oral - Nectar Teaspoon -- Oral - Nectar Cup -- Oral - Nectar Straw -- Oral - Thin Teaspoon -- Oral - Thin Cup Holding of bolus;Delayed oral transit Oral - Thin Straw Holding of bolus;Delayed oral transit Oral - Puree Delayed oral transit;Holding of bolus Oral - Mech Soft -- Oral - Regular Holding of bolus;Reduced posterior propulsion;Decreased bolus cohesion;Delayed oral transit Oral - Multi-Consistency -- Oral - Pill Decreased bolus cohesion;Delayed oral transit;Reduced posterior propulsion;Holding of bolus;Other (Comment) Oral Phase - Comment --  CHL IP PHARYNGEAL PHASE 12/14/2015 Pharyngeal Phase Impaired Pharyngeal- Pudding Teaspoon -- Pharyngeal -- Pharyngeal- Pudding Cup -- Pharyngeal -- Pharyngeal- Honey Teaspoon -- Pharyngeal -- Pharyngeal- Honey Cup -- Pharyngeal -- Pharyngeal- Nectar Teaspoon -- Pharyngeal -- Pharyngeal- Nectar Cup -- Pharyngeal -- Pharyngeal- Nectar Straw -- Pharyngeal -- Pharyngeal- Thin Teaspoon -- Pharyngeal -- Pharyngeal- Thin Cup Delayed swallow initiation-pyriform sinuses Pharyngeal -- Pharyngeal- Thin Straw Delayed swallow initiation-pyriform sinuses Pharyngeal -- Pharyngeal- Puree Delayed swallow initiation-pyriform sinuses Pharyngeal -- Pharyngeal- Mechanical Soft -- Pharyngeal -- Pharyngeal- Regular Delayed swallow initiation-pyriform sinuses Pharyngeal -- Pharyngeal- Multi-consistency -- Pharyngeal -- Pharyngeal- Pill -- Pharyngeal -- Pharyngeal Comment --  No flowsheet data found. No flowsheet data found. Harlon Ditty, [place] CCC-SLP [phone] Claudine Mouton 12/14/2015, 11:09 AM                  Assessment and plan discussed with with attending physician and they are in agreement.    Felicie Morn PA-C [facility] [phone]  12/14/2015, 11:23 AM   Assessment: 75 y.o. male with a history of dementia, A. fib on Xarelto, hypertension who presents to the hospital secondary to 2 week history of declining health. Patient found to have a temperature of 101.7 and currently  being evaluated for SIRS. MRI brain showed acute on subacute infarct on the right. At this time I believe this is likely not a significant factor in his declining health but more of a incidental finding. Patient currently is on Xarelto for his A. fib and given his age and dementia likely not a surgical candidate for carotid stenosis.  Stroke Risk Factors - hyperlipidemia and hypertension  Recommend: 1) continue Xarelto 2) repeat LDL and A1c 3) treat underlying infection  Dr. Benedict Needy to addend this note

## 2015-12-14 NOTE — Progress Notes (Signed)
Modified Barium Swallow Progress Note  Patient Details  Name: Calvin HawkingJames Deringer MRN: [id number] Date of Birth: 1940-03-18  Today's Date: 12/14/2015  Modified Barium Swallow completed.  Full report located under Chart Review in the Imaging Section.  Brief recommendations include the following:  Clinical Impression  Pt demosntrates moderate dysphagia, mostly attributable to cognitive impariment.   Mastication is prolonged, verbal cues needed for transit. Pt orally holds all boluses and struggles to initaite swallow response. Verbal cues and visual cues to give next bolus aid in swallow initiation. Regardless, all boluses reach the pyriform sinuses prior to intiation. WIth upright posture there is no penetration or aspiration observed, no residual. Pt is recommended to consume a dys 2/thin liquid diet with full supervision for verbal cueing and assist. Pt is at risk for aspiration if posture is suboptimal and also at risk for poor nutrition/hydration given lack of initiation. Will follow for swallowing and cognitive therapy.    Swallow Evaluation Recommendations       SLP Diet Recommendations: Dysphagia 2 (Fine chop) solids;Thin liquid   Liquid Administration via: Cup;Straw   Medication Administration: Crushed with puree   Supervision: Full supervision/cueing for compensatory strategies;Staff to assist with self feeding   Compensations: Minimize environmental distractions;Other (Comment) (give verbal reminders to take bites, swallow)   Postural Changes: Seated upright at 90 degrees   Oral Care Recommendations: Oral care BID   Other Recommendations: Have oral suction available   Harlon DittyBonnie Dmetrius Ambs, MA CCC-SLP [phone]  [name], Aminah Zabawa Caroline 12/14/2015,11:07 AM

## 2015-12-14 NOTE — Evaluation (Signed)
Physical Therapy Evaluation Patient Details Name: Calvin Diaz MRN: [id number] DOB: [birth year] Today's Date: 12/14/2015   History of Present Illness  Calvin Diaz an 75 y.o.male(per chart) medical history significant of dementia, HTn, a fib, HLD. Per family who was at the bedside initially but not present duringmy exam patient has been declining in health 2 weeks.MRI of brain showed acute on subacute infarct on the Right.  Clinical Impression  Pt with noted severe L sided neglect, rigidity x4 extremities, extreme L UE pain with movement. Pt with no h/o dementia however pt also with impaired processing, command follow and sequencing. Pt was amb indep PTA per chart, pt now requires modAx2 for all mobility. Recommend CIR upon d/c to address deficits and decrease burden of care on family for safe transition home. If pt can not go to CIR pt will need ST-SNF.    Follow Up Recommendations CIR;Supervision/Assistance - 24 hour    Equipment Recommendations   (tbd)    Recommendations for Other Services       Precautions / Restrictions Precautions Precautions: Fall Precaution Comments: h/o dementia Restrictions Weight Bearing Restrictions: No      Mobility  Bed Mobility Overal bed mobility: Needs Assistance;+2 for physical assistance Bed Mobility: Supine to Sit     Supine to sit: Mod assist;+2 for physical assistance     General bed mobility comments: hob elevated, used bed pad to complete helicopter transfer to EOB  Transfers Overall transfer level: Needs assistance Equipment used:  (2 person lift with gait belt and bed pad) Transfers: Sit to/from Stand;Stand Pivot Transfers Sit to Stand: Max assist;+2 physical assistance Stand pivot transfers: Max assist;+2 physical assistance       General transfer comment: pt resistant due to fear of falling.  posterior lean with bilat LE locking in extension. once tactile cues were given for weight shifting pt advanced LE and took  4 steps to chair  Ambulation/Gait                Stairs            Wheelchair Mobility    Modified Rankin (Stroke Patients Only)       Balance Overall balance assessment: Needs assistance Sitting-balance support: Feet supported;Bilateral upper extremity supported Sitting balance-Leahy Scale: Poor Sitting balance - Comments: required minA to maintain balance Postural control: Posterior lean Standing balance support: Bilateral upper extremity supported Standing balance-Leahy Scale: Poor Standing balance comment: requires external support                             Pertinent Vitals/Pain Pain Assessment: Faces Faces Pain Scale: Hurts worst Pain Location: L UE with mvmt    Home Living Family/patient expects to be discharged to:: Inpatient rehab                 Additional Comments: pt was at home per chart    Prior Function Level of Independence: Independent         Comments: per chart pt was amb indep PTA, until last friday     Hand Dominance   Dominant Hand: Right    Extremity/Trunk Assessment   Upper Extremity Assessment: Generalized weakness (bilat UE weak, L UE painful, neither shld >45 deg flex)           Lower Extremity Assessment: Generalized weakness (bilat LE with rigidity)      Cervical / Trunk Assessment: Kyphotic  Communication   Communication: Expressive difficulties (word finding  difficulties)  Cognition Arousal/Alertness: Awake/alert Behavior During Therapy: WFL for tasks assessed/performed Overall Cognitive Status: Impaired/Different from baseline (aware pt with dementia) Area of Impairment: Attention;Following commands;Safety/judgement;Awareness;Problem solving   Current Attention Level: Focused   Following Commands: Follows one step commands with increased time;Follows one step commands inconsistently Safety/Judgement: Decreased awareness of safety;Decreased awareness of deficits Awareness:  Intellectual Problem Solving: Slow processing;Decreased initiation;Difficulty sequencing;Requires verbal cues;Requires tactile cues General Comments: pt with L sided neglect    General Comments      Exercises     Assessment/Plan    PT Assessment Patient needs continued PT services  PT Problem List Decreased strength;Decreased range of motion;Decreased activity tolerance;Decreased balance;Decreased mobility          PT Treatment Interventions DME instruction;Gait training;Stair training;Functional mobility training;Therapeutic activities;Therapeutic exercise;Balance training;Neuromuscular re-education    PT Goals (Current goals can be found in the Care Plan section)  Acute Rehab PT Goals Patient Stated Goal: didn't state PT Goal Formulation: Patient unable to participate in goal setting Time For Goal Achievement: 12/28/15 Potential to Achieve Goals: Good    Frequency Min 3X/week   Barriers to discharge Decreased caregiver support family unable to provide max A to patient    Co-evaluation               End of Session Equipment Utilized During Treatment: Gait belt Activity Tolerance: Patient limited by pain;Patient limited by fatigue Patient left: in chair;with call bell/phone within reach;with chair alarm set Nurse Communication: Mobility status         Time: [id number] PT Time Calculation (min) (ACUTE ONLY): 21 min   Charges:   PT Evaluation $PT Eval Moderate Complexity: 1 Procedure     PT G Codes:        Gerica Koble M Janit Cutter 12/14/2015, 5:24 PM   Lewis ShockAshly Terisha Losasso, PT, DPT Pager #: [phone] Office #: [phone]

## 2015-12-14 NOTE — Evaluation (Signed)
Clinical/Bedside Swallow Evaluation Patient Details  Name: Jeani HawkingJames Slimp MRN: [id number] Date of Birth: [birth year]  Today's Date: 12/14/2015 Time: SLP Start Time (ACUTE ONLY): [id number] SLP Stop Time (ACUTE ONLY): 0956 SLP Time Calculation (min) (ACUTE ONLY): 33 min  Past Medical History:  Past Medical History:  Diagnosis Date  . Dementia   . High cholesterol   . Hypertension   . Myocardial infarction   . Stroke [facility](HCC)    Past Surgical History:  Past Surgical History:  Procedure Laterality Date  . CORONARY ANGIOPLASTY     HPI:  [name] a 75 y.o.malewith medical history significant of dementia, HTn, a fib, HLD, prior right CVA with residual deficits, details not provided. Per family upon admission, patient has been declining in health 2 weeks. Patient normally can ambulate by himself but since Friday he has been unable to do so. He also now has slurred speech as well as decreased movement in his left arm. Found to have a rectal temp of 101.7, he had an increase in his white blood cell count. Chest x-ray was negative for pneumonia, UA was negative for infection,blood cultures have been ordered 2. MRI shows new infarct in right parietal lobe, in same area as prior CVA.    Assessment / Plan / Recommendation Clinical Impression  Pt demonstrates impaired swallowing ability due to cognitive impairment and suspected neuromuscular weakness, clearly CN VII is affected on left. The pt is inattentive to PO and will not initiate self feeding without moderate cues. Total assist feeding results in prolonged oral holding, which is still present with self feeding, but improved with verbal cues. Swallow is not only delayed, but subjectively feels quite weak with minimal hyolaryngeal mobility upon palpation. Despite these impairments, pt had no overt signs of aspiration, no coughing, vocal quality clear. Given concern for infection and signs of new CVA, very concerned for aspiration, which may be  silent. Pt also at risk for dehydration and poor nutrition due to cognitive impairment impacting PO intake. Recommend MBS for objective assessment of swallowing. Will f/u this am.     Aspiration Risk  Moderate aspiration risk;Risk for inadequate nutrition/hydration    Diet Recommendation Dysphagia 2 (Fine chop);Thin liquid   Liquid Administration via: Cup;Straw Medication Administration: Whole meds with liquid Supervision: Full supervision/cueing for compensatory strategies Compensations: Slow rate;Small sips/bites;Other (Comment) (verbal reminders to eat, drink and swallow. ) Postural Changes: Seated upright at 90 degrees    Other  Recommendations Oral Care Recommendations: Oral care BID Other Recommendations: Have oral suction available   Follow up Recommendations Inpatient Rehab      Frequency and Duration min 2x/week  2 weeks       Prognosis Prognosis for Safe Diet Advancement: Fair      Swallow Study   General HPI: [name] a 75 y.o.malewith medical history significant of dementia, HTn, a fib, HLD, prior right CVA with residual deficits, details not provided. Per family upon admission, patient has been declining in health 2 weeks. Patient normally can ambulate by himself but since Friday he has been unable to do so. He also now has slurred speech as well as decreased movement in his left arm. Found to have a rectal temp of 101.7, he had an increase in his white blood cell count. Chest x-ray was negative for pneumonia, UA was negative for infection,blood cultures have been ordered 2. MRI shows new infarct in right parietal lobe, in same area as prior CVA.  Type of Study: Bedside Swallow Evaluation Previous Swallow Assessment:  none in chart Diet Prior to this Study: Dysphagia 2 (chopped);Thin liquids Temperature Spikes Noted: Yes Respiratory Status: Room air History of Recent Intubation: No Behavior/Cognition: Alert;Cooperative;Requires cueing Oral Cavity  Assessment: Dried secretions Oral Care Completed by SLP: Yes Oral Cavity - Dentition: Adequate natural dentition Vision: Impaired for self-feeding Self-Feeding Abilities: Needs assist Patient Positioning: Upright in bed Baseline Vocal Quality: Normal Volitional Cough: Strong Volitional Swallow: Unable to elicit    Oral/Motor/Sensory Function Overall Oral Motor/Sensory Function: Moderate impairment Facial ROM: Reduced left;Suspected CN VII (facial) dysfunction Facial Symmetry: Abnormal symmetry left;Suspected CN VII (facial) dysfunction Facial Strength: Reduced left;Suspected CN VII (facial) dysfunction Lingual ROM: Within Functional Limits Lingual Symmetry: Within Functional Limits Lingual Strength: Within Functional Limits Velum: Within Functional Limits Mandible: Within Functional Limits   Ice Chips     Thin Liquid Thin Liquid: Impaired Presentation: Cup;Straw;Self Fed Pharyngeal  Phase Impairments: Decreased hyoid-laryngeal movement;Suspected delayed Swallow    Nectar Thick Nectar Thick Liquid: Not tested   Honey Thick Honey Thick Liquid: Not tested   Puree Puree: Impaired Presentation: Spoon;Self Fed Oral Phase Impairments: Poor awareness of bolus Oral Phase Functional Implications: Prolonged oral transit;Oral residue;Oral holding Pharyngeal Phase Impairments: Suspected delayed Swallow;Decreased hyoid-laryngeal movement   Solid   GO   Solid: Impaired Presentation: Self Fed Oral Phase Impairments: Poor awareness of bolus Oral Phase Functional Implications: Prolonged oral transit;Oral holding;Oral residue        Terrisha Lopata, Riley NearingBonnie Caroline 12/14/2015,10:06 AM

## 2015-12-14 NOTE — Care Management Note (Addendum)
Case Management Note  Patient Details  Name: Calvin Diaz MRN: [id number] Date of Birth: November 30, 1940  Subjective/Objective:                 Patient admitted from home with CVA. Patient active with [facility] for PT OT RN. CVA 05/2015 left pt with LUE weakness, now patient exhibiting LLE weakness per MD. SNF anticipated at DC. Stroke workup pending including PT eval, CSW following. PCP [facility] : Dr Tenny Crawoss  Action/Plan:  Anticipate DC when medically clear, likely SNF.    Addendum 11/24 Spoke with CSW Tor NettersSara M, updated that patient is to be discharged to SNF today.   Expected Discharge Date:                  Expected Discharge Plan:  Skilled Nursing Facility  In-House Referral:  Clinical Social Work  Discharge planning Services  CM Consult  Post Acute Care Choice:    Choice offered to:     DME Arranged:    DME Agency:     HH Arranged:    HH Agency:     Status of Service:  In process, will continue to follow  If discussed at Long Length of Stay Meetings, dates discussed:    Additional Comments:  Lawerance SabalDebbie Lummie Montijo, RN 12/14/2015, 11:18 AM

## 2015-12-14 NOTE — Progress Notes (Signed)
Pt. seen by SLP and has generated a recommendation for a CIR prescreen.  At this time, await PT/OT evaluations before requesting an IP rehab consult.  Please call if questions.  Weldon PickingSusan Abygale Karpf PT Inpatient Rehab Admissions Coordinator Cell [phone] Office [phone]

## 2015-12-15 ENCOUNTER — Encounter (HOSPITAL_COMMUNITY): Payer: Self-pay | Admitting: Radiology

## 2015-12-15 ENCOUNTER — Inpatient Hospital Stay (HOSPITAL_COMMUNITY): Payer: Medicare Other

## 2015-12-15 DIAGNOSIS — I6930 Unspecified sequelae of cerebral infarction: Secondary | ICD-10-CM

## 2015-12-15 DIAGNOSIS — I69391 Dysphagia following cerebral infarction: Secondary | ICD-10-CM

## 2015-12-15 DIAGNOSIS — D72829 Elevated white blood cell count, unspecified: Secondary | ICD-10-CM

## 2015-12-15 DIAGNOSIS — F0390 Unspecified dementia without behavioral disturbance: Secondary | ICD-10-CM

## 2015-12-15 DIAGNOSIS — I639 Cerebral infarction, unspecified: Secondary | ICD-10-CM

## 2015-12-15 DIAGNOSIS — R6510 Systemic inflammatory response syndrome (SIRS) of non-infectious origin without acute organ dysfunction: Secondary | ICD-10-CM

## 2015-12-15 DIAGNOSIS — R609 Edema, unspecified: Secondary | ICD-10-CM

## 2015-12-15 DIAGNOSIS — M130 Polyarthritis, unspecified: Secondary | ICD-10-CM | POA: Diagnosis present

## 2015-12-15 DIAGNOSIS — M25512 Pain in left shoulder: Secondary | ICD-10-CM

## 2015-12-15 DIAGNOSIS — I481 Persistent atrial fibrillation: Secondary | ICD-10-CM

## 2015-12-15 DIAGNOSIS — R000 Tachycardia, unspecified: Secondary | ICD-10-CM

## 2015-12-15 DIAGNOSIS — I63411 Cerebral infarction due to embolism of right middle cerebral artery: Secondary | ICD-10-CM

## 2015-12-15 DIAGNOSIS — E876 Hypokalemia: Secondary | ICD-10-CM

## 2015-12-15 DIAGNOSIS — I10 Essential (primary) hypertension: Secondary | ICD-10-CM

## 2015-12-15 DIAGNOSIS — E785 Hyperlipidemia, unspecified: Secondary | ICD-10-CM

## 2015-12-15 DIAGNOSIS — D62 Acute posthemorrhagic anemia: Secondary | ICD-10-CM

## 2015-12-15 LAB — BASIC METABOLIC PANEL
Anion gap: 8 (ref 5–15)
Anion gap: 9 (ref 5–15)
BUN: 8 mg/dL (ref 6–20)
BUN: 9 mg/dL (ref 6–20)
CALCIUM: 8.5 mg/dL — AB (ref 8.9–10.3)
CALCIUM: 8.7 mg/dL — AB (ref 8.9–10.3)
CHLORIDE: 104 mmol/L (ref 101–111)
CO2: 24 mmol/L (ref 22–32)
CO2: 26 mmol/L (ref 22–32)
CREATININE: 0.65 mg/dL (ref 0.61–1.24)
CREATININE: 0.76 mg/dL (ref 0.61–1.24)
Chloride: 99 mmol/L — ABNORMAL LOW (ref 101–111)
GFR calc non Af Amer: >60 mL/min (ref >60)
Glucose, Bld: 132 mg/dL — ABNORMAL HIGH (ref 65–99)
Glucose, Bld: 124 mg/dL — ABNORMAL HIGH (ref 65–99)
Potassium: 3.2 mmol/L — ABNORMAL LOW (ref 3.5–5.1)
Potassium: 3.2 mmol/L — ABNORMAL LOW (ref 3.5–5.1)
SODIUM: 134 mmol/L — AB (ref 135–145)
Sodium: 136 mmol/L (ref 135–145)

## 2015-12-15 LAB — RAPID URINE DRUG SCREEN, HOSP PERFORMED
AMPHETAMINES: NOT DETECTED
BARBITURATES: NOT DETECTED
Benzodiazepines: NOT DETECTED
Cocaine: NOT DETECTED
OPIATES: NOT DETECTED
TETRAHYDROCANNABINOL: NOT DETECTED

## 2015-12-15 LAB — MAGNESIUM: Magnesium: 1.8 mg/dL (ref 1.7–2.4)

## 2015-12-15 LAB — URIC ACID: URIC ACID, SERUM: 2.8 mg/dL — AB (ref 4.4–7.6)

## 2015-12-15 MED ORDER — METHYLPREDNISOLONE SODIUM SUCC 125 MG IJ SOLR
60.0000 mg | Freq: Once | INTRAMUSCULAR | Status: AC
  Administered 2015-12-15: 60 mg via INTRAVENOUS
  Filled 2015-12-15: qty 2

## 2015-12-15 MED ORDER — METHYLPREDNISOLONE SODIUM SUCC 125 MG IJ SOLR: 60.0000 mg | Freq: Every day | INTRAMUSCULAR | Status: DC

## 2015-12-15 MED ORDER — ENSURE ENLIVE PO LIQD
237.0000 mL | Freq: Two times a day (BID) | ORAL | Status: DC
  Administered 2015-12-16 – 2015-12-21 (×10): 237 mL via ORAL

## 2015-12-15 MED ORDER — IOPAMIDOL (ISOVUE-370) INJECTION 76%
INTRAVENOUS | Status: AC
  Administered 2015-12-15: 50 mL
  Filled 2015-12-15: qty 50

## 2015-12-15 MED ORDER — POTASSIUM CHLORIDE CRYS ER 20 MEQ PO TBCR
40.0000 meq | EXTENDED_RELEASE_TABLET | Freq: Once | ORAL | Status: DC
  Filled 2015-12-15: qty 2

## 2015-12-15 MED ORDER — POTASSIUM CHLORIDE 20 MEQ/15ML (10%) PO SOLN
40.0000 meq | Freq: Once | ORAL | Status: AC
  Administered 2015-12-15: 40 meq via ORAL
  Filled 2015-12-15: qty 30

## 2015-12-15 MED ORDER — PREDNISONE 20 MG PO TABS
40.0000 mg | ORAL_TABLET | Freq: Every day | ORAL | Status: DC
  Administered 2015-12-16 – 2015-12-21 (×6): 40 mg via ORAL
  Filled 2015-12-15 (×7): qty 2

## 2015-12-15 NOTE — Progress Notes (Signed)
I met with pt and his daughter from [place]. Pt recently remarried and lives with his wife. She is unable to provide him physical assist at home which is expected he will need at d/c. Therefore we are recommending SNF rehab. Pt recently at SNF in [facility] in 07/2015 and is in agreement. Daughter is aware and in agreement. SW recently in with pt and I have discussed our recommendation. We will sign off at this time. [phone]

## 2015-12-15 NOTE — Progress Notes (Signed)
PROGRESS NOTE        PATIENT DETAILS Name: Calvin Diaz Age: 75 y.o. Sex: male Date of Birth: 1940-07-29 Admit Date: 12/13/2015 Admitting Physician Joseph Art, DO ZOX:[id number],UJWJ CHARLES, MD  Brief Narrative: Patient is a 75 y.o. male recent CVA, atrial fibrillation on anticoagulation who presented to the ED with 2-3 week history of waxing and waning confusion, intermittent slurred speech and inability to ambulate due to pain in his left upper and lower extremity. See below for further details.  Subjective: Somewhat confused and sleepy when I had seen him earlier this morning-but much more awake and alert this afternoon. He is able to provide more history-he has had pain in his left elbow,left shoulder and his left ankle that has prevented him from ambulation. This has been ongoing for at least 3 weeks.  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 His left elbow is very tender and swollen, he also exhibited some tenderness above his left shoulder, his left ankle is mildly swollen and somewhat tender. Blood cultures negative so far. Obtain uric acid level, x-ray of his left elbow, left ankle-have consulted orthopedics (spoke with Dr Charlann Boxer) for arthrocentesis. This is ongoing for at least 3 weeks per spouse and patient,hence await workup before initiating antigout agents or antibiotics.  ? Acute CVA: Poor historian-he does have some amount of dementia at baseline-not sure if he is unable to move his left side due to pain from polyarticular arthritis or he has had another small CVA. MRI brain shows a possible small acute CVA in the right parietal lobe-stroke team following-continue Xarelto for now. Await further recommendations.  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   Hypertension: Controlled, continue amlodipine and Terazosin  Hypokalemia: Replete and recheck  Dementia with mild delirium: Continue Namenda.  Chronic insomnia: Resume belsomra as outpatient  DVT Prophylaxis: Full dose anticoagulation with Xarelto  Code Status: DNR-ordered after conversation with spouse at bedside this morning  Family Communication: Spouse at bedside  Disposition Plan: Remain inpatient-CIR vs SNF on discharge  Antimicrobial agents: See below  Procedures: None  CONSULTS:  neurology  Time spent: 25 minutes-Greater than 50% of this time was spent in counseling, explanation of diagnosis, planning of further management, and coordination of care.  MEDICATIONS: Anti-infectives    Start     Dose/Rate Route Frequency Ordered Stop   12/14/15 0600  vancomycin (VANCOCIN) 500 mg in sodium chloride 0.9 % 100 mL IVPB  Status:  Discontinued     500 mg 100 mL/hr over 60 Minutes Intravenous Every 12 hours 12/13/15 1752 12/13/15 2049   12/13/15 2300  piperacillin-tazobactam (ZOSYN) IVPB 3.375 g  Status:  Discontinued     3.375 g 12.5 mL/hr over 240 Minutes Intravenous Every 8 hours 12/13/15 1752 12/13/15 2049   12/13/15 1700  vancomycin (VANCOCIN) IVPB 1000 mg/200 mL premix     1,000 mg 200 mL/hr over 60 Minutes Intravenous  Once 12/13/15 1653 12/13/15 2006   12/13/15 1700  piperacillin-tazobactam (ZOSYN) IVPB 3.375  g     3.375 g 100 mL/hr over 30 Minutes Intravenous  Once 12/13/15 1653 12/13/15 1747      Scheduled Meds: . memantine  10 mg Oral Daily  . multivitamin with minerals  1 tablet Oral Daily  . pravastatin  80 mg Oral QHS  . rivaroxaban  20 mg Oral Q supper  . sodium chloride flush  3  mL Intravenous Q12H  . terazosin  1 mg Oral QHS   Continuous Infusions: PRN Meds:.acetaminophen **OR** acetaminophen   PHYSICAL EXAM: Vital signs: Vitals:   12/14/15 2131 12/15/15 0553 12/15/15 1100 12/15/15 1302  BP: (!) 125/56 118/64 120/64 (!) 129/107  Pulse: 99 96  (!) 101  Resp: 18 17  20   Temp: 98.5 F (36.9 C) 99 F (37.2 C)  98 F (36.7 C)  TempSrc: Oral Oral  Oral  SpO2: 100% 98%  95%  Weight:  59.6 kg (131 lb 6.3 oz)    Height:       Filed Weights   12/13/15 2151 12/15/15 0553  Weight: 56.5 kg (124 lb 9 oz) 59.6 kg (131 lb 6.3 oz)   Body mass index is 18.85 kg/m.   General appearance :Awake,Mostly alert but speech is clear this afternoon.  Eyes:, pupils equally reactive to light and accomodation,no scleral icterus.Pink conjunctiva HEENT: Atraumatic and Normocephalic Neck: supple, no JVD. No cervical lymphadenopathy. No thyromegaly Resp:Good air entry bilaterally, no added sounds  CVS: S1 S2 irregular.  GI: Bowel sounds present, Non tender and not distended with no gaurding, rigidity or rebound.No organomegaly Extremities: B/L Lower Ext shows no edema, both legs are warm to touch Neurology: Hard to discern  left upper and left lower extremity weakness is due to pain from swollen left ankle and left elbow-but after multiple attempts-seems able to lift both left upper/lower extremity against gravity but not against resistance. 5/5 in right upper and right lower extremity. Musculoskeletal:No digital cyanosis Skin:No Rash, warm and dry Wounds:N/A  I have personally reviewed following labs and imaging studies  LABORATORY DATA: CBC:  Recent Labs Lab 12/13/15 1615 12/14/15 0250  WBC 13.1* 11.1*  NEUTROABS 10.3*  --   HGB 12.5* 11.4*  HCT 36.9* 34.3*  MCV 88.7 89.6  PLT 416* 403*    Basic Metabolic Panel:  Recent Labs Lab 12/13/15 1615 12/14/15 0250 12/15/15 0911  NA 141 141 136  K 3.0* 3.1* 3.2*  CL 102 105 104  CO2 29 28 24   GLUCOSE 132* 114*  132*  BUN 20 14 8   CREATININE 1.14 0.87 0.65  CALCIUM 9.2 8.8* 8.5*  MG  --   --  1.8    GFR: Estimated Creatinine Clearance: 68.3 mL/min (by C-G formula based on SCr of 0.65 mg/dL).  Liver Function Tests:  Recent Labs Lab 12/13/15 1615  AST 34  ALT 24  ALKPHOS 65  BILITOT 0.4  PROT 8.1  ALBUMIN 2.8*    Recent Labs Lab 12/13/15 1615  LIPASE 20   No results for input(s): AMMONIA in the last 168 hours.  Coagulation Profile: No results for input(s): INR, PROTIME in the last 168 hours.  Cardiac Enzymes:  Recent Labs Lab 12/13/15 2102 12/14/15 0250  TROPONINI 0.03* 0.03*    BNP (last 3 results) No results for input(s): PROBNP in the last 8760 hours.  HbA1C: No results for input(s): HGBA1C in the last 72 hours.  CBG: No results for input(s): GLUCAP in the last 168 hours.  Lipid Profile: No results for input(s): CHOL, HDL, LDLCALC, TRIG,  CHOLHDL, LDLDIRECT in the last 72 hours.  Thyroid Function Tests:  Recent Labs  12/13/15 1615  TSH 1.220    Anemia Panel: No results for input(s): VITAMINB12, FOLATE, FERRITIN, TIBC, IRON, RETICCTPCT in the last 72 hours.  Urine analysis:    Component Value Date/Time   COLORURINE AMBER (A) 12/13/2015 1602   APPEARANCEUR CLEAR 12/13/2015 1602   LABSPEC 1.023 12/13/2015 1602   PHURINE 6.0 12/13/2015 1602   GLUCOSEU NEGATIVE 12/13/2015 1602   HGBUR NEGATIVE 12/13/2015 1602   BILIRUBINUR SMALL (A) 12/13/2015 1602   KETONESUR NEGATIVE 12/13/2015 1602   PROTEINUR 30 (A) 12/13/2015 1602   NITRITE NEGATIVE 12/13/2015 1602   LEUKOCYTESUR NEGATIVE 12/13/2015 1602    Sepsis Labs: Lactic Acid, Venous    Component Value Date/Time   LATICACIDVEN 0.73 12/13/2015 1931    MICROBIOLOGY: Recent Results (from the past 240 hour(s))  Culture, blood (Routine X 2) w Reflex to ID Panel     Status: None (Preliminary result)   Collection Time: 12/13/15  5:07 PM  Result Value Ref Range Status   Specimen Description BLOOD LEFT  FOREARM  Final   Special Requests BOTTLES DRAWN AEROBIC AND ANAEROBIC 5CC  Final   Culture NO GROWTH < 24 HOURS  Final   Report Status PENDING  Incomplete  Culture, blood (Routine X 2) w Reflex to ID Panel     Status: None (Preliminary result)   Collection Time: 12/13/15  5:07 PM  Result Value Ref Range Status   Specimen Description BLOOD RIGHT FOREARM  Final   Special Requests BOTTLES DRAWN AEROBIC AND ANAEROBIC 5CC  Final   Culture NO GROWTH < 24 HOURS  Final   Report Status PENDING  Incomplete    RADIOLOGY STUDIES/RESULTS: Dg Chest 2 View  Result Date: 12/07/2015 CLINICAL DATA:  Cough for 2 weeks, hypertension. EXAM: CHEST  2 VIEW COMPARISON:  09/06/2015 FINDINGS: There is hyperinflation of the lungs compatible with COPD. Heart and mediastinal contours are within normal limits. No focal opacities or effusions. No acute bony abnormality. IMPRESSION: COPD.  No active disease. Electronically Signed   By: Charlett NoseKevin  Dover M.D.   On: 12/07/2015 16:06   Ct Head Wo Contrast  Result Date: 12/07/2015 CLINICAL DATA:  LEFT side pain weakness for 3 months, history of stroke on 06/23/2015, dementia, hypertension, former smoker EXAM: CT HEAD WITHOUT CONTRAST TECHNIQUE: Contiguous axial images were obtained from the base of the skull through the vertex without intravenous contrast. COMPARISON:  Non FINDINGS: Brain: Generalized atrophy. Mild ex vacuo dilatation of the ventricular system. No midline shift or mass effect. LEFT frontal infarct. Larger RIGHT frontal and parietal infarcts. BILATERAL frontal infarcts appear old. RIGHT parietal infarct is likely subacute to old. No intracranial hemorrhage, mass lesion or extra-axial fluid collection. Vascular: Scattered atherosclerotic calcifications at the carotid siphons bilaterally Skull: Intact Sinuses/Orbits: Clear Other: N/A IMPRESSION: Atrophy with small vessel chronic ischemic changes of deep cerebral white matter. LEFT frontal and RIGHT frontal/parietal  infarcts as above without intracranial hemorrhage. Electronically Signed   By: Ulyses SouthwardMark  Boles M.D.   On: 12/07/2015 14:22   Mr Brain Wo Contrast  Result Date: 12/14/2015 CLINICAL DATA:  Declining health for 2 weeks. Difficulty ambulating. Slurred speech. Decreased movement in left arm. EXAM: MRI HEAD WITHOUT CONTRAST TECHNIQUE: Multiplanar, multiecho pulse sequences of the brain and surrounding structures were obtained without intravenous contrast. COMPARISON:  Head CT 12/07/2015 FINDINGS: Despite efforts by the technologist and patient, motion artifact is present on today's examination and could not be eliminated. This  reduces the sensitivity and specificity of the study. There is a small area of diffusion restriction within the right parietal lobe, which is in the region of encephalomalacia associated with prior large right MCA infarct. There is also extensive left frontal encephalomalacia and right occipital encephalomalacia. No midline shift or other significant mass effect. There is ex vacuo dilatation of the ventricles. No extra-axial collection. IMPRESSION: 1. Severely motion degraded examination. 2. Within the above limitation, there is a small area of acute ischemia superimposed on the area of encephalomalacia associated with the remote right MCA infarct. This is along the right postcentral gyrus. 3. Large areas of encephalomalacia in the right MCA territory and left frontal lobe. Electronically Signed   By: Deatra Robinson M.D.   On: 12/14/2015 05:26   Dg Chest Port 1 View  Result Date: 12/13/2015 CLINICAL DATA:  75 year old male with shortness of breath and confusion EXAM: PORTABLE CHEST 1 VIEW COMPARISON:  Prior chest x-ray 12/07/2015 FINDINGS: Cardiac and mediastinal contours remain unchanged. Atherosclerotic calcifications present in the thoracic aorta. No pulmonary edema, focal airspace consolidation, large pleural effusion or pneumothorax. Hyperinflation and central bronchitic changes are  similar compared to prior. No acute osseous abnormality. IMPRESSION: 1. No active cardiopulmonary disease. 2.  Aortic Atherosclerosis (ICD10-170.0) 3. Hyperinflation and central bronchitic changes suggest COPD. Electronically Signed   By: Malachy Moan M.D.   On: 12/13/2015 16:18   Dg Swallowing Func-speech Pathology  Result Date: 12/14/2015 Objective Swallowing Evaluation: Type of Study: MBS-Modified Barium Swallow Study Patient Details Name: Washington Whedbee MRN: [id number] Date of Birth: 10-25-1940 Today's Date: 12/14/2015 Time: SLP Start Time (ACUTE ONLY): 1030-SLP Stop Time (ACUTE ONLY): 1045 SLP Time Calculation (min) (ACUTE ONLY): 15 min Past Medical History: Past Medical History: Diagnosis Date . Dementia  . High cholesterol  . Hypertension  . Myocardial infarction  . Stroke [facility])  Past Surgical History: Past Surgical History: Procedure Laterality Date . CORONARY ANGIOPLASTY   HPI: Porfirio Bollier a 75 y.o.malewith medical history significant of dementia, HTn, a fib, HLD, prior right CVA with residual deficits, details not provided. Per family upon admission, patient has been declining in health 2 weeks. Patient normally can ambulate by himself but since Friday he has been unable to do so. He also now has slurred speech as well as decreased movement in his left arm. Found to have a rectal temp of 101.7, he had an increase in his white blood cell count. Chest x-ray was negative for pneumonia, UA was negative for infection,blood cultures have been ordered 2. MRI shows new infarct in right parietal lobe, in same area as prior CVA.  No Data Recorded Assessment / Plan / Recommendation CHL IP CLINICAL IMPRESSIONS 12/14/2015 Therapy Diagnosis Moderate oral phase dysphagia;Moderate pharyngeal phase dysphagia Clinical Impression Pt demosntrates moderate dysphagia, mostly attributable to cognitive impariment.   Mastication is prolonged, verbal cues needed for transit. Pt orally holds all boluses and  struggles to initaite swallow response. Verbal cues and visual cues to give next bolus aid in swallow initiation. Regardless, all boluses reach the pyriform sinuses prior to intiation. WIth upright posture there is no penetration or aspiration observed, no residual. Pt is recommended to consume a dys 2/thin liquid diet with full supervision for verbal cueing and assist. Pt is at risk for aspiration if posture is suboptimal and also at risk for poor nutrition/hydration given lack of initiation. Will follow for swallowing and cognitive therapy.    Impact on safety and function Moderate aspiration risk;Risk for inadequate nutrition/hydration  CHL IP TREATMENT RECOMMENDATION 12/14/2015 Treatment Recommendations Therapy as outlined in treatment plan below   Prognosis 12/14/2015 Prognosis for Safe Diet Advancement Fair Barriers to Reach Goals Cognitive deficits Barriers/Prognosis Comment -- CHL IP DIET RECOMMENDATION 12/14/2015 SLP Diet Recommendations Dysphagia 2 (Fine chop) solids;Thin liquid Liquid Administration via Cup;Straw Medication Administration Crushed with puree Compensations Minimize environmental distractions;Other (Comment) Postural Changes Seated upright at 90 degrees   CHL IP OTHER RECOMMENDATIONS 12/14/2015 Recommended Consults -- Oral Care Recommendations Oral care BID Other Recommendations Have oral suction available   CHL IP FOLLOW UP RECOMMENDATIONS 12/14/2015 Follow up Recommendations Inpatient Rehab   CHL IP FREQUENCY AND DURATION 12/14/2015 Speech Therapy Frequency (ACUTE ONLY) min 2x/week Treatment Duration 2 weeks      CHL IP ORAL PHASE 12/14/2015 Oral Phase Impaired Oral - Pudding Teaspoon -- Oral - Pudding Cup -- Oral - Honey Teaspoon -- Oral - Honey Cup -- Oral - Nectar Teaspoon -- Oral - Nectar Cup -- Oral - Nectar Straw -- Oral - Thin Teaspoon -- Oral - Thin Cup Holding of bolus;Delayed oral transit Oral - Thin Straw Holding of bolus;Delayed oral transit Oral - Puree Delayed oral  transit;Holding of bolus Oral - Mech Soft -- Oral - Regular Holding of bolus;Reduced posterior propulsion;Decreased bolus cohesion;Delayed oral transit Oral - Multi-Consistency -- Oral - Pill Decreased bolus cohesion;Delayed oral transit;Reduced posterior propulsion;Holding of bolus;Other (Comment) Oral Phase - Comment --  CHL IP PHARYNGEAL PHASE 12/14/2015 Pharyngeal Phase Impaired Pharyngeal- Pudding Teaspoon -- Pharyngeal -- Pharyngeal- Pudding Cup -- Pharyngeal -- Pharyngeal- Honey Teaspoon -- Pharyngeal -- Pharyngeal- Honey Cup -- Pharyngeal -- Pharyngeal- Nectar Teaspoon -- Pharyngeal -- Pharyngeal- Nectar Cup -- Pharyngeal -- Pharyngeal- Nectar Straw -- Pharyngeal -- Pharyngeal- Thin Teaspoon -- Pharyngeal -- Pharyngeal- Thin Cup Delayed swallow initiation-pyriform sinuses Pharyngeal -- Pharyngeal- Thin Straw Delayed swallow initiation-pyriform sinuses Pharyngeal -- Pharyngeal- Puree Delayed swallow initiation-pyriform sinuses Pharyngeal -- Pharyngeal- Mechanical Soft -- Pharyngeal -- Pharyngeal- Regular Delayed swallow initiation-pyriform sinuses Pharyngeal -- Pharyngeal- Multi-consistency -- Pharyngeal -- Pharyngeal- Pill -- Pharyngeal -- Pharyngeal Comment --  No flowsheet data found. No flowsheet data found. Harlon DittyBonnie DeBlois, KentuckyMA CCC-SLP [phone] [name], Bonnie Caroline 12/14/2015, 11:09 AM                LOS: 2 days   Jeoffrey MassedGHIMIRE,Joda Braatz, MD  [facility] Pager:[phone]  If 7PM-7AM, please contact night-coverage [email or web address] Password [email or web address] 12/15/2015, 1:37 PM

## 2015-12-15 NOTE — Progress Notes (Signed)
Speech Language Pathology Treatment: Dysphagia;Cognitive-Linquistic  Patient Details Name: Calvin Diaz MRN: [id number] DOB: 1940/05/04 Today's Date: 12/15/2015 Time: [id number] SLP Time Calculation (min) (ACUTE ONLY): 23 min  Assessment / Plan / Recommendation Clinical Impression  Pt demonstrates improvement in initiation today. SLP assisted pt with self care, face washing, brushing teeth, eating meal. Max verbal and tactile cues provided initially to tasks in right visual field, by end of session, pt able to direct attention to midline, slightly to left with min verbal and primarily auditory/visual cues. Prolonged mastication still observed with solids, pt needed verbal cues to alternate solids and liquids to facilitate oral transit and swallow trigger. Discussed importance of reducing distraction and providing cueing to pts wife. Will continue to follow acutely.    HPI HPI: Calvin Diaz a 75 y.o.malewith medical history significant of dementia, HTn, a fib, HLD, prior right CVA with residual deficits, details not provided. Per family upon admission, patient has been declining in health 2 weeks. Patient normally can ambulate by himself but since Friday he has been unable to do so. He also now has slurred speech as well as decreased movement in his left arm. Found to have a rectal temp of 101.7, he had an increase in his white blood cell count. Chest x-ray was negative for pneumonia, UA was negative for infection,blood cultures have been ordered 2. MRI shows new infarct in right parietal lobe, in same area as prior CVA.       SLP Plan  Continue with current plan of care     Recommendations  Diet recommendations: Dysphagia 2 (fine chop);Thin liquid Liquids provided via: Cup;Straw Medication Administration: Whole meds with liquid Supervision: Full supervision/cueing for compensatory strategies Compensations: Minimize environmental distractions (verbal and tactiel cueing for pt to feed  himself) Postural Changes and/or Swallow Maneuvers: Seated upright 90 degrees                General recommendations: Rehab consult Oral Care Recommendations: Oral care BID Follow up Recommendations: Inpatient Rehab Plan: Continue with current plan of care       GO               Kosciusko Community HospitalBonnie Tayshawn Purnell, MA CCC-SLP [phone]  [name], Hyla Coard Caroline 12/15/2015, 9:57 AM

## 2015-12-15 NOTE — Progress Notes (Signed)
STROKE TEAM PROGRESS NOTE   HISTORY OF PRESENT ILLNESS (per record) Calvin Diaz is an 75 y.o. male (per chart) medical history significant of dementia, HTN, a fib, HLD. Per family who was at the bedside initially but not present duringmy exam, patient has been declining in health 2 weeks. Patient normally can ambulate by himself but since Friday he has been unable to do so. He also now has slurred speech as well as decreased movement in his left arm. On arrival patient was found to have a temperature of 101.7 and is currently being worked up for SIRS. Blood cultures pending. 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 Patient is currently on Xarelto. Neurology was asked to see patient secondary to incidental infarct on MRI.  The patient just came back from having a swallow screen. Patient is very drowsy and falls asleep multiple times during interview and also during physical exam. Patient has baseline dementia he is rarely using [place] but cannot tell me what kind of building, what year, what month it is. Patient has difficulty following commands secondary to being both drowsy and also having significant pain in his left side. No histories able to be obtained from him.  His LKW is unknown. Patient was not considered due to being on an anticoagulant.    SUBJECTIVE (INTERVAL HISTORY) Wife is at bedside. She stated that for the last 2 weeks pt not moving left arm and then not able to walk. He had stroke in 05/2015 in [facility], but later he was able to walk and use left arm although still not baseline but for the last two weeks, there is dramatic change. He had low grade fever PTA, but infectious work up so far negative. However, pt has significant guarding on the left UE, as well as pain at left shoulder, left elbow and b/l ankles.     OBJECTIVE Temp:  [97.9 F (36.6 C)-99 F (37.2 C)] 99 F (37.2 C) (11/22 0553) Pulse Rate:  [96-99] 96 (11/22  0553) Cardiac Rhythm: Sinus tachycardia (11/22 0700) Resp:  [17-20] 17 (11/22 0553) BP: (118-135)/(56-64) 118/64 (11/22 0553) SpO2:  [98 %-100 %] 98 % (11/22 0553) Weight:  [59.6 kg (131 lb 6.3 oz)] 59.6 kg (131 lb 6.3 oz) (11/22 0553)  CBC:  Recent Labs Lab 12/13/15 1615 12/14/15 0250  WBC 13.1* 11.1*  NEUTROABS 10.3*  --   HGB 12.5* 11.4*  HCT 36.9* 34.3*  MCV 88.7 89.6  PLT 416* 403*    Basic Metabolic Panel:  Recent Labs Lab 12/14/15 0250 12/15/15 0911  NA 141 136  K 3.1* 3.2*  CL 105 104  CO2 28 24  GLUCOSE 114* 132*  BUN 14 8  CREATININE 0.87 0.65  CALCIUM 8.8* 8.5*  MG  --  1.8    Lipid Panel:    Component Value Date/Time   CHOL 127 08/18/2015 1512   TRIG 64 08/18/2015 1512   HDL 57 08/18/2015 1512   CHOLHDL 2.2 08/18/2015 1512   VLDL 13 08/18/2015 1512   LDLCALC 57 08/18/2015 1512   HgbA1c: No results found for: HGBA1C Urine Drug Screen: No results found for: LABOPIA, COCAINSCRNUR, LABBENZ, AMPHETMU, THCU, LABBARB    IMAGING I have personally reviewed the radiological images below and agree with the radiology interpretations.  Mr Brain Wo Contrast 12/14/2015 1. Severely motion degraded examination. 2. Within the above limitation, there is a small area of acute ischemia superimposed on the area of encephalomalacia associated with the remote  right MCA infarct. This is along the right postcentral gyrus. 3. Large areas of encephalomalacia in the right MCA territory and left frontal lobe.   Dg Chest Port 1 View 12/13/2015 1. No active cardiopulmonary disease. 2.  Aortic Atherosclerosis (ICD10-170.0) 3. Hyperinflation and central bronchitic changes suggest COPD.   TTE 08/2015 - Left ventricle: The cavity size was normal. Systolic function was   normal. The estimated ejection fraction was in the range of 60%   to 65%. Wall motion was normal; there were no regional wall   motion abnormalities. The study is not technically sufficient to   allow evaluation  of LV diastolic function. - Aortic valve: Nodular calcificatio nof left and non coronary   cusps - Mitral valve: Calcified annulus. - Right atrium: The atrium was mildly dilated. - Atrial septum: No defect or patent foramen ovale was identified.  CTA head and neck pending   PHYSICAL EXAM  Temp:  [98 F (36.7 C)-99 F (37.2 C)] 98 F (36.7 C) (11/22 1302) Pulse Rate:  [96-101] 101 (11/22 1302) Resp:  [17-20] 20 (11/22 1302) BP: (118-129)/(56-107) 129/107 (11/22 1302) SpO2:  [95 %-100 %] 95 % (11/22 1302) Weight:  [131 lb 6.3 oz (59.6 kg)] 131 lb 6.3 oz (59.6 kg) (11/22 0553)  General - Well nourished, well developed, pain at left arm with significant guarding.  Ophthalmologic - Fundi not visualized due to noncooperation.  Cardiovascular - irregularly irregular heart rate and rhythm.  Mental Status -  Level of arousal and orientation to time, and person were intact, however, not orientated to time. Language including expression, naming, repetition was assessed and found intact, able to follow simple commands but not 2-step commands. Left neglect.  Cranial Nerves II - XII - II - left hemianopia. III, IV, VI - Extraocular movements intact. V - Facial sensation intact bilaterally. VII - left nasolabial fold flattening. VIII - hard of hearing & vestibular intact bilaterally. X - Palate elevates symmetrically, mild dysarthria. XI - Chin turning & shoulder shrug intact bilaterally. XII - Tongue protrusion intact.  Motor Strength - The patient's strength was 4/5 RUE and 3/5 RLE. However, there is significant guarding of LUE with tenderness on palpation of left shoulder, left elbow with left elbow swollen. LLE 2/5 and tenderness on palpitation at ankle. Bulk was normal and fasciculations were absent.   Motor Tone - Muscle tone was assessed at the neck and appendages and was normal except left UE not able to test due to significant guarding.  Reflexes - The patient's reflexes were  1+ in all extremities except LUE and he had no pathological reflexes.  Sensory - not cooperative.    Coordination - ataxia or dysmetria not cooperative on exam.  Tremor was absent.  Gait and Station - not tested   ASSESSMENT/PLAN Mr. Calvin HawkingJames Diaz is a 75 y.o. male with history of dementia, HTN, a fib, HLD admitted with declining health x 2 weeks. In hospital, developed slurred speech and LUE hemiparesis.  He did not receive IV t-PA due to out of window.   Left arm and leg pain   Could be the reason for his non ambulatory status for the last two weeks  Need to rule out fracture or joint dislocation  Consider gout as one possibility - if CXR no fracture or dislocation, may consider trial of steroids and colchicine.   Fever at home  CXR negative  UA negative   BCx pending  WBC 13.1-11.1  Gout is possibility if no source of infection  found  DWI restriction signal at right MCA encephalomalacia - signal due to scar tissue/laminar necrosis vs. New ischemia  MRI  Right MCA scattered small DWI restrication at the margin of encephalomalacia  CTA head and neck  pending  2D Echo 08/2015 EF 60-65%. No source of embolus   LDL ordered  HgbA1c ordered  xarelto for VTE prophylaxis  DIET DYS 2 Room service appropriate? 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 known atrial fibrillation  [facility]  Residue left hemianopia and left hemiparesis  But able to walk with assistance at home  On Xarelto for stroke prevention PTA   Atrial Fibrillation  Home anticoagulation:  Xarelto (rivaroxaban) daily continued in the hospital  Continue xarelto at  discharge    Hypertension  Stable  Permissive hypertension (OK if < 180/105) but gradually normalize in 5-7 days  Long-term BP goal normotensive  Hyperlipidemia  Home meds:  Pravachol 80, resumed in hospital  LDL ordered, goal < 70  Continue statin at discharge  Other Stroke Risk Factors  Advanced age  Former Cigarette smoker  Family hx stroke (maternal aunt)  Coronary artery disease - MI, CABG  Other Active Problems  Baseline dementia on [place]  Hospital day # 2  Marvel PlanJindong Laqueshia Cihlar, MD PhD Stroke Neurology 12/15/2015 3:05 PM    To contact Stroke Continuity provider, please refer to [email or web address]. After hours, contact General Neurology

## 2015-12-15 NOTE — Consult Note (Signed)
Physical Medicine and Rehabilitation Consult   Reason for Consult: Weakness and gait deficits   HPI: Calvin Diaz is a 75 y.o. 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 He was noted to be febrile due to SIRS and was started on IVF and IV antibiotic empirically.  Patient with diagnosis of A fib with plans for DCCV and ran out of Xarelto for a couple of weeks --resumed by cardiology a week ago. MRI brain done revealing small area of acute ischemia superimposed on area of encephalomalacia associated with remote R-MCA infarct along postcentral gyrus. Large areas of encephalomalacia in R-MCA and left frontal lobe. Recent echo 08/2015 with EF 60-65% and no wall abnormality.  Lactic acid/infectious work up negative therefore antibiotics discontinued.   Cognitive evaluation revealed moderate to severe deficits with left visual field inattention, and lack of initiation with poor attention. He exhibited moderate dysphagia attributable to cognitive impairments therefore placed on dysphagia 2, thin liquids. PT evaluation done noting posterior lean with fear of falling, rigidity in all extremities and LUE pain. CIR recommended to decrease burden of care.   He used to live in [facility] until stroke 07/2015--moved here to be with family and got remarried to Ex-wife in August. Has children in [place]. He has had two round of therapy but has been declining since d/c therapy Oct--he was able to walk with supervision but needed direction and assistance with ADL tasks.     Review of Systems  Unable to perform ROS: Dementia  HENT: Negative for hearing loss.   Eyes: Positive for blurred vision (very poor ).  Respiratory: Negative for cough.   Genitourinary:       Bladder  incontinence.   Musculoskeletal: Positive for falls (a week ago) and myalgias (severe muscle pain BUE/BLE for past  2 weeks. ).  Psychiatric/Behavioral: Positive for memory loss. The patient has insomnia (stays up at  nights and sleeps during the day. ).   All other systems reviewed and are negative.   Past Medical History:  Diagnosis Date  . Dementia   . High cholesterol   . Hypertension   . Myocardial infarction   . Stroke [facility])     Past Surgical History:  Procedure Laterality Date  . CORONARY ANGIOPLASTY      Family History  Problem Relation Age of Onset  . Arrhythmia Mother     Had pacemaker  . Stroke Maternal Aunt     Social History:  Lives with family--remarried this August. Retired IT trainer. Moved here in July after rehab in [facility]. Wife disabled and brother--POA works days. Per reports that he has quit smoking  06/2015. His smoking use included Cigarettes. He quit smokeless tobacco use about 3 months ago. Per reports has a history of ETOH abuse. Quit ETOH and  he does not drink alcohol or use drugs.    Allergies: No Known Allergies    Medications Prior to Admission  Medication Sig Dispense Refill  . amLODipine (NORVASC) 10 MG tablet Take 1 tablet (10 mg total) by mouth daily. 90 tablet 3  . memantine (NAMENDA) 5 MG tablet Take 2 tablets (10 mg total) by mouth daily. 90 tablet 3  . Multiple Vitamin (MULTIVITAMIN WITH MINERALS) TABS tablet Take 1 tablet by mouth daily.    Marland Kitchen  pravastatin (PRAVACHOL) 80 MG tablet Take 1 tablet (80 mg total) by mouth at bedtime. 90 tablet 3  . rivaroxaban (XARELTO) 20 MG TABS tablet Take 1 tablet (20 mg total) by mouth daily with supper. 90 tablet 3  . terazosin (HYTRIN) 1 MG capsule Take 1 capsule (1 mg total) by mouth at bedtime. 30 capsule 5  . Suvorexant (BELSOMRA) 10 MG TABS Take 1 tablet by mouth daily. (Patient not taking: Reported on 12/13/2015) 30 tablet 0    Home: Home Living Family/patient expects to be discharged to:: Inpatient  rehab Additional Comments: pt was at home per chart  Lives With: Family  Functional History: Prior Function Level of Independence: Independent Comments: per chart pt was amb indep PTA, until last friday Functional Status:  Mobility: Bed Mobility Overal bed mobility: Needs Assistance, +2 for physical assistance Bed Mobility: Supine to Sit Supine to sit: Mod assist, +2 for physical assistance General bed mobility comments: hob elevated, used bed pad to complete helicopter transfer to EOB Transfers Overall transfer level: Needs assistance Equipment used:  (2 person lift with gait belt and bed pad) Transfers: Sit to/from Stand, Stand Pivot Transfers Sit to Stand: Max assist, +2 physical assistance Stand pivot transfers: Max assist, +2 physical assistance General transfer comment: pt resistant due to fear of falling.  posterior lean with bilat LE locking in extension. once tactile cues were given for weight shifting pt advanced LE and took 4 steps to chair      ADL:    Cognition: Cognition Overall Cognitive Status: Impaired/Different from baseline (aware pt with dementia) Arousal/Alertness: Awake/alert Orientation Level: Oriented to person, Oriented to place, Disoriented to time Attention: Focused, Sustained Focused Attention: Impaired Focused Attention Impairment: Verbal basic, Functional basic Sustained Attention: Impaired Sustained Attention Impairment: Verbal basic, Functional basic Memory: Impaired Memory Impairment: Storage deficit Awareness: Impaired Awareness Impairment: Intellectual impairment, Emergent impairment, Anticipatory impairment Problem Solving: Impaired Problem Solving Impairment: Functional basic, Verbal basic Executive Function: Initiating Initiating: Impaired Initiating Impairment: Functional basic Safety/Judgment: Impaired Cognition Arousal/Alertness: Awake/alert Behavior During Therapy: WFL for tasks assessed/performed Overall Cognitive Status:  Impaired/Different from baseline (aware pt with dementia) Area of Impairment: Attention, Following commands, Safety/judgement, Awareness, Problem solving Current Attention Level: Focused Following Commands: Follows one step commands with increased time, Follows one step commands inconsistently Safety/Judgement: Decreased awareness of safety, Decreased awareness of deficits Awareness: Intellectual Problem Solving: Slow processing, Decreased initiation, Difficulty sequencing, Requires verbal cues, Requires tactile cues General Comments: pt with L sided neglect  Blood pressure 118/64, pulse 96, temperature 99 F (37.2 C), temperature source Oral, resp. rate 17, height 5\' 10"  (1.778 m), weight 59.6 kg (131 lb 6.3 oz), SpO2 98 %. Physical Exam  Nursing note and vitals reviewed. Constitutional: He appears well-developed. No distress.  Thin male  HENT:  Head: Normocephalic and atraumatic.  Eyes: Conjunctivae and EOM are normal. Pupils are equal, round, and reactive to light.  Neck: Normal range of motion. Neck supple.  Cardiovascular: Normal rate and regular rhythm.   Respiratory: Effort normal and breath sounds normal. No respiratory distress. He has no wheezes.  GI: Soft. Bowel sounds are normal. He exhibits no distension. There is no tenderness.  Musculoskeletal: He exhibits edema and tenderness.  Neurological: He is alert.  Oriented to self and place.   Right gaze preference with left inattention.   Flat affect with distraction.  Unable to follow simple motor commands consistently.  DTRs symmetric Motor: RUE: ?4+/5 proximal to distal LUE: 1+/5 proximal to distal RLE: HF, KNE 2/5,  ADF/PF 2+/5 LLE: HP, KE 2-/5, ADF/PF 2/5, limited due to resistance and discomfort.  Bilateral feet hyperalgesia  Skin: Skin is warm and dry.  Blisters right shin and left heel.  Mild erythema dorsum of right foot and left hand.   Psychiatric: His affect is blunt. He is slowed. Cognition and memory are  impaired. He expresses inappropriate judgment. He is noncommunicative. He is inattentive.    No results found for this or any previous visit (from the past 24 hour(s)). Mr Brain Wo Contrast  Result Date: 12/14/2015 CLINICAL DATA:  Declining health for 2 weeks. Difficulty ambulating. Slurred speech. Decreased movement in left arm. EXAM: MRI HEAD WITHOUT CONTRAST TECHNIQUE: Multiplanar, multiecho pulse sequences of the brain and surrounding structures were obtained without intravenous contrast. COMPARISON:  Head CT 12/07/2015 FINDINGS: Despite efforts by the technologist and patient, motion artifact is present on today's examination and could not be eliminated. This reduces the sensitivity and specificity of the study. There is a small area of diffusion restriction within the right parietal lobe, which is in the region of encephalomalacia associated with prior large right MCA infarct. There is also extensive left frontal encephalomalacia and right occipital encephalomalacia. No midline shift or other significant mass effect. There is ex vacuo dilatation of the ventricles. No extra-axial collection. IMPRESSION: 1. Severely motion degraded examination. 2. Within the above limitation, there is a small area of acute ischemia superimposed on the area of encephalomalacia associated with the remote right MCA infarct. This is along the right postcentral gyrus. 3. Large areas of encephalomalacia in the right MCA territory and left frontal lobe. Electronically Signed   By: Deatra Robinson M.D.   On: 12/14/2015 05:26   Dg Chest Port 1 View  Result Date: 12/13/2015 CLINICAL DATA:  75 year old male with shortness of breath and confusion EXAM: PORTABLE CHEST 1 VIEW COMPARISON:  Prior chest x-ray 12/07/2015 FINDINGS: Cardiac and mediastinal contours remain unchanged. Atherosclerotic calcifications present in the thoracic aorta. No pulmonary edema, focal airspace consolidation, large pleural effusion or pneumothorax.  Hyperinflation and central bronchitic changes are similar compared to prior. No acute osseous abnormality. IMPRESSION: 1. No active cardiopulmonary disease. 2.  Aortic Atherosclerosis (ICD10-170.0) 3. Hyperinflation and central bronchitic changes suggest COPD. Electronically Signed   By: Malachy Moan M.D.   On: 12/13/2015 16:18   Dg Swallowing Func-speech Pathology  Result Date: 12/14/2015 Objective Swallowing Evaluation: Type of Study: MBS-Modified Barium Swallow Study Patient Details Name: Calvin Diaz MRN: [id number] Date of Birth: October 03, 1940 Today's Date: 12/14/2015 Time: SLP Start Time (ACUTE ONLY): 1030-SLP Stop Time (ACUTE ONLY): 1045 SLP Time Calculation (min) (ACUTE ONLY): 15 min Past Medical History: Past Medical History: Diagnosis Date . Dementia  . High cholesterol  . Hypertension  . Myocardial infarction  . Stroke [facility])  Past Surgical History: Past Surgical History: Procedure Laterality Date . CORONARY ANGIOPLASTY   HPI: Calvin Diaz a 75 y.o.malewith medical history significant of dementia, HTn, a fib, HLD, prior right CVA with residual deficits, details not provided. Per family upon admission, patient has been declining in health 2 weeks. Patient normally can ambulate by himself but since Friday he has been unable to do so. He also now has slurred speech as well as decreased movement in his left arm. Found to have a rectal temp of 101.7, he had an increase in his white blood cell count. Chest x-ray was negative for pneumonia, UA was negative for infection,blood cultures have been ordered 2. MRI shows new infarct in right parietal lobe, in same  area as prior CVA.  No Data Recorded Assessment / Plan / Recommendation CHL IP CLINICAL IMPRESSIONS 12/14/2015 Therapy Diagnosis Moderate oral phase dysphagia;Moderate pharyngeal phase dysphagia Clinical Impression Pt demosntrates moderate dysphagia, mostly attributable to cognitive impariment.   Mastication is prolonged, verbal cues needed  for transit. Pt orally holds all boluses and struggles to initaite swallow response. Verbal cues and visual cues to give next bolus aid in swallow initiation. Regardless, all boluses reach the pyriform sinuses prior to intiation. WIth upright posture there is no penetration or aspiration observed, no residual. Pt is recommended to consume a dys 2/thin liquid diet with full supervision for verbal cueing and assist. Pt is at risk for aspiration if posture is suboptimal and also at risk for poor nutrition/hydration given lack of initiation. Will follow for swallowing and cognitive therapy.    Impact on safety and function Moderate aspiration risk;Risk for inadequate nutrition/hydration   CHL IP TREATMENT RECOMMENDATION 12/14/2015 Treatment Recommendations Therapy as outlined in treatment plan below   Prognosis 12/14/2015 Prognosis for Safe Diet Advancement Fair Barriers to Reach Goals Cognitive deficits Barriers/Prognosis Comment -- CHL IP DIET RECOMMENDATION 12/14/2015 SLP Diet Recommendations Dysphagia 2 (Fine chop) solids;Thin liquid Liquid Administration via Cup;Straw Medication Administration Crushed with puree Compensations Minimize environmental distractions;Other (Comment) Postural Changes Seated upright at 90 degrees   CHL IP OTHER RECOMMENDATIONS 12/14/2015 Recommended Consults -- Oral Care Recommendations Oral care BID Other Recommendations Have oral suction available   CHL IP FOLLOW UP RECOMMENDATIONS 12/14/2015 Follow up Recommendations Inpatient Rehab   CHL IP FREQUENCY AND DURATION 12/14/2015 Speech Therapy Frequency (ACUTE ONLY) min 2x/week Treatment Duration 2 weeks      CHL IP ORAL PHASE 12/14/2015 Oral Phase Impaired Oral - Pudding Teaspoon -- Oral - Pudding Cup -- Oral - Honey Teaspoon -- Oral - Honey Cup -- Oral - Nectar Teaspoon -- Oral - Nectar Cup -- Oral - Nectar Straw -- Oral - Thin Teaspoon -- Oral - Thin Cup Holding of bolus;Delayed oral transit Oral - Thin Straw Holding of bolus;Delayed  oral transit Oral - Puree Delayed oral transit;Holding of bolus Oral - Mech Soft -- Oral - Regular Holding of bolus;Reduced posterior propulsion;Decreased bolus cohesion;Delayed oral transit Oral - Multi-Consistency -- Oral - Pill Decreased bolus cohesion;Delayed oral transit;Reduced posterior propulsion;Holding of bolus;Other (Comment) Oral Phase - Comment --  CHL IP PHARYNGEAL PHASE 12/14/2015 Pharyngeal Phase Impaired Pharyngeal- Pudding Teaspoon -- Pharyngeal -- Pharyngeal- Pudding Cup -- Pharyngeal -- Pharyngeal- Honey Teaspoon -- Pharyngeal -- Pharyngeal- Honey Cup -- Pharyngeal -- Pharyngeal- Nectar Teaspoon -- Pharyngeal -- Pharyngeal- Nectar Cup -- Pharyngeal -- Pharyngeal- Nectar Straw -- Pharyngeal -- Pharyngeal- Thin Teaspoon -- Pharyngeal -- Pharyngeal- Thin Cup Delayed swallow initiation-pyriform sinuses Pharyngeal -- Pharyngeal- Thin Straw Delayed swallow initiation-pyriform sinuses Pharyngeal -- Pharyngeal- Puree Delayed swallow initiation-pyriform sinuses Pharyngeal -- Pharyngeal- Mechanical Soft -- Pharyngeal -- Pharyngeal- Regular Delayed swallow initiation-pyriform sinuses Pharyngeal -- Pharyngeal- Multi-consistency -- Pharyngeal -- Pharyngeal- Pill -- Pharyngeal -- Pharyngeal Comment --  No flowsheet data found. No flowsheet data found. Harlon DittyBonnie DeBlois, [place] CCC-SLP [phone] [name], Bonnie Caroline 12/14/2015, 11:09 AM               Assessment/Plan: Diagnosis: R-MCA infarct Labs and images independently reviewed.  Records reviewed and summated above. Stroke: Continue secondary stroke prophylaxis and Risk Factor Modification listed below:   Antiplatelet therapy:   Blood Pressure Management:  Continue current medication with prn's with permisive HTN per primary team Statin Agent:   ?Diabetes management:   Left sided hemiparesis:  fit for orthosis to prevent contractures (resting hand splint for day, wrist cock up splint at night, PRAFO, etc) Motor recovery: Fluoxetine   1. Does the  need for close, 24 hr/day medical supervision in concert with the patient's rehab needs make it unreasonable for this patient to be served in a less intensive setting? Potentially  2. 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 Due to bladder management, bowel management, safety, skin/wound care, disease management, pain management and patient education, does the patient require 24 hr/day rehab nursing? Yes 4. Does the patient require coordinated care of a physician, rehab nurse, PT (1-2 hrs/day, 5 days/week), OT (1-2 hrs/day, 5 days/week) and SLP (1-2 hrs/day, 5 days/week) to address physical and functional deficits in the context of the above medical diagnosis(es)? Yes Addressing deficits in the following areas: balance, endurance, locomotion, strength, transferring, bowel/bladder control, bathing, dressing, feeding, grooming, toileting, cognition, speech, swallowing and psychosocial support 5. Can the patient actively participate in an intensive therapy program of at least 3 hrs of therapy per day at least 5 days per week? Potentially 6. The potential for patient to make measurable gains while on inpatient rehab is excellent 7. Anticipated functional outcomes upon discharge from inpatient rehab are min assist  with PT, min assist with OT, min assist with  SLP. 8. Estimated rehab length of stay to reach the above functional goals is: 14-18 days. 9. Does the patient have adequate social supports and living environment to accommodate these discharge functional goals? No 10. Anticipated D/C setting: SNF 11. Anticipated post D/C treatments: SNF 12. Overall Rehab/Functional Prognosis: good and fair  RECOMMENDATIONS: This patient's condition is appropriate for continued rehabilitative care in the following setting: Will await completion of medical workup, however, currently pt without physical support at discharge and will likely require assistance given current functional status, recent falls, and cogntiive deficits.  If caregiver support available, recommend CIR, otherwise SNF. Patient has agreed to participate in recommended program. Potentially Note that insurance prior authorization may be required for reimbursement for recommended care.  Comment: Rehab Admissions Coordinator to follow up.  Maryla MorrowAnkit Natilee Gauer, MD, [place] DomFAAPMR 12/15/2015

## 2015-12-15 NOTE — Evaluation (Signed)
Occupational Therapy Evaluation Patient Details Name: Calvin Diaz MRN: [id number] DOB: 1941/01/17 Today's Date: 12/15/2015    History of Present Illness Calvin Diaz an 75 y.o.male(per chart) medical history significant of dementia, HTn, a fib, HLD. Per family who was at the bedside initially but not present duringmy exam patient has been declining in health 2 weeks.MRI of brain showed acute on subacute infarct on the Right.   Clinical Impression   Per wife, pt independent with ADL PTA. Currently pt mod assist +2 for basic transfers and total assist for ADL. Pt presenting with L sided visual inattention, L sided weakness and pain, impaired standing balance, impaired cognition impacting his independence and safety with ADL and functional mobility. Recommending CIR level therapies to maximize independence and safety with ADL and functional mobility prior to return home. Pt would benefit from continued skilled OT to address established goals.    Follow Up Recommendations  CIR;Supervision/Assistance - 24 hour    Equipment Recommendations  Other (comment) (TBD at next venue)    Recommendations for Other Services       Precautions / Restrictions Precautions Precautions: Fall Precaution Comments: h/o dementia Restrictions Weight Bearing Restrictions: No      Mobility Bed Mobility Overal bed mobility: Needs Assistance;+2 for physical assistance Bed Mobility: Rolling;Supine to Sit Rolling: Max assist;+2 for physical assistance   Supine to sit: Max assist;+2 for physical assistance     General bed mobility comments: HOB elevated with use of bed pad to advance LEs to edge of bed.  Pt required assist with B LEs and upper trunk contol.    Transfers Overall transfer level: Needs assistance Equipment used: Rolling walker (2 wheeled) Transfers: Sit to/from UGI CorporationStand;Stand Pivot Transfers Sit to Stand: Mod assist;+2 physical assistance Stand pivot transfers: Mod assist;+2  physical assistance       General transfer comment: Pt remains resistant due to fear of falling.  R hand placed on RW to improve forward weight shifting and ease patient's anxiety.  After standing patient attempting to reach with LUE for hand grip on RW, PTA assisted L hand to RW.  Pt required assist to boost patient into standing.  Pt performed steps to chair with +2 mod assist.  pt then required cues to reach for surface with R hand while PTA guarded LUE.  pt reports feeling like he is falling.  PTA cued patient that he is sitting and anxiety diminshed.  Remains rigid throughout movements.      Balance Overall balance assessment: Needs assistance Sitting-balance support: Feet supported;Bilateral upper extremity supported Sitting balance-Leahy Scale: Poor     Standing balance support: Bilateral upper extremity supported Standing balance-Leahy Scale: Poor                              ADL Overall ADL's : Needs assistance/impaired             Lower Body Bathing: Total assistance;Bed level   Upper Body Dressing : Total assistance;Bed level Upper Body Dressing Details (indicate cue type and reason): to doff/don gown Lower Body Dressing: Total assistance;Bed level Lower Body Dressing Details (indicate cue type and reason): to don socks Toilet Transfer: Moderate assistance;+2 for physical assistance;Stand-pivot;BSC;RW Toilet Transfer Details (indicate cue type and reason): Simulated by sit to stand from EOB with stand pivot to chair. Toileting- Clothing Manipulation and Hygiene: Total assistance;Bed level Toileting - Clothing Manipulation Details (indicate cue type and reason): for peri care  Functional mobility during ADLs: Moderate assistance;+2 for physical assistance;Rolling walker (for stand pivot only) General ADL Comments: Pt with strong L neglect; wife reports this was present PTA. Pt able to visually cross midline with cues but does not maintain.     Vision  Vision Assessment?: Yes Additional Comments: L inattention noted, wife reports this was present PTA. Does cross midline visually with cues but unable to sustain.    Perception     Praxis      Pertinent Vitals/Pain Pain Assessment: 0-10 Pain Score: 10-Worst pain ever Faces Pain Scale: Hurts worst Pain Location: generalized pain with movement Pain Descriptors / Indicators: Grimacing;Guarding;Sore Pain Intervention(s): Monitored during session;Repositioned     Hand Dominance Right   Extremity/Trunk Assessment Upper Extremity Assessment Upper Extremity Assessment: LUE deficits/detail;Generalized weakness LUE Deficits / Details: Difficult to assess due to pain with touch and movement. Pt attempting to wiggle fingers and grip RW with mobility. Ridgid when attempting to perfrom PROM. LUE: Unable to fully assess due to pain LUE Coordination: decreased fine motor;decreased gross motor   Lower Extremity Assessment Lower Extremity Assessment: Defer to PT evaluation   Cervical / Trunk Assessment Cervical / Trunk Assessment: Kyphotic   Communication Communication Communication: Expressive difficulties   Cognition Arousal/Alertness: Awake/alert Behavior During Therapy: WFL for tasks assessed/performed Overall Cognitive Status: Impaired/Different from baseline (aware pt with dementia) Area of Impairment: Attention;Following commands;Awareness;Safety/judgement;Problem solving   Current Attention Level: Focused   Following Commands: Follows one step commands with increased time;Follows one step commands inconsistently Safety/Judgement: Decreased awareness of safety;Decreased awareness of deficits Awareness: Intellectual Problem Solving: Slow processing;Decreased initiation;Difficulty sequencing;Requires verbal cues;Requires tactile cues General Comments: pt with L sided neglect   General Comments       Exercises       Shoulder Instructions      Home Living Family/patient  expects to be discharged to:: Inpatient rehab                                        Prior Functioning/Environment Level of Independence: Independent        Comments: Per wife, pt was independent with ADL and mobility up until ~2 weeks ago. Since then, he has needed increased assist with ADL.        OT Problem List: Decreased strength;Decreased range of motion;Decreased activity tolerance;Impaired balance (sitting and/or standing);Impaired vision/perception;Decreased coordination;Decreased cognition;Decreased safety awareness;Decreased knowledge of use of DME or AE;Decreased knowledge of precautions;Impaired sensation;Impaired tone;Impaired UE functional use;Pain   OT Treatment/Interventions: Self-care/ADL training;Neuromuscular education;Energy conservation;DME and/or AE instruction;Therapeutic activities;Cognitive remediation/compensation;Visual/perceptual remediation/compensation;Patient/family education;Balance training    OT Goals(Current goals can be found in the care plan section) Acute Rehab OT Goals Patient Stated Goal: didn't state OT Goal Formulation: With patient Time For Goal Achievement: 12/29/15 Potential to Achieve Goals: Good  OT Frequency: Min 2X/week   Barriers to D/C:            Co-evaluation PT/OT/SLP Co-Evaluation/Treatment: Yes Reason for Co-Treatment: Complexity of the patient's impairments (multi-system involvement);Necessary to address cognition/behavior during functional activity;For patient/therapist safety PT goals addressed during session: Mobility/safety with mobility;Balance OT goals addressed during session: ADL's and self-care      End of Session Equipment Utilized During Treatment: Gait belt;Rolling walker Nurse Communication: Mobility status  Activity Tolerance: Patient tolerated treatment well;Patient limited by pain Patient left: in chair;with call bell/phone within reach;with chair alarm set   Time: 8413-2440 OT Time  Calculation (min):  29 min Charges:  OT General Charges $OT Visit: 1 Procedure OT Evaluation $OT Eval Moderate Complexity: 1 Procedure G-Codes:     Gaye AlkenBailey A Jhalil Silvera M.S., OTR/L Pager: [phone]  12/15/2015, 1:23 PM

## 2015-12-15 NOTE — NC FL2 (Signed)
[place] MEDICAID FL2 LEVEL OF CARE SCREENING TOOL     IDENTIFICATION  Patient Name: Calvin Diaz Birthdate: 04-30-1940 Sex: male Admission Date (Current Location): 12/13/2015  [facility] [place]   Facility and Address:  [facility], [address], [place], [place] [address]      Provider Number: [id number]  Attending Physician Name and Address:  Maretta BeesShanker M Ghimire, MD  Relative Name and Phone Number:  [name], spouse, [phone]    Current Level of Care: Hospital Recommended Level of Care: Skilled Nursing Facility Prior Approval Number:    Date Approved/Denied:   PASRR Number: [id number]  Discharge Plan: SNF    Current Diagnoses: Patient Active Problem List   Diagnosis Date Noted  . Acute CVA (cerebrovascular accident) (HCC) 12/14/2015  . SIRS (systemic inflammatory response syndrome) (HCC) 12/14/2015  . Hypokalemia 12/14/2015  . Fever 12/13/2015  . OAB (overactive bladder) 09/20/2015  . Essential hypertension 09/07/2015  . Atrial fibrillation (HCC) 09/07/2015  . Dementia 09/07/2015  . History of CVA with residual deficit 08/18/2015  . Former smoker 08/18/2015  . History of alcohol abuse 08/18/2015  . Hyperlipidemia 08/18/2015    Orientation RESPIRATION BLADDER Height & Weight     Self, Place  Normal Incontinent Weight: 59.6 kg (131 lb 6.3 oz) Height:  5\' 10"  (177.8 cm)  BEHAVIORAL SYMPTOMS/MOOD NEUROLOGICAL BOWEL NUTRITION STATUS      Incontinent Diet (Please see DC Summary)  AMBULATORY STATUS COMMUNICATION OF NEEDS Skin   Extensive Assist Verbally Normal                       Personal Care Assistance Level of Assistance  Bathing, Feeding, Dressing Bathing Assistance: Maximum assistance Feeding assistance: Limited assistance Dressing Assistance: Limited assistance     Functional Limitations Info             SPECIAL CARE FACTORS FREQUENCY  PT (By licensed PT), OT (By licensed OT)     PT  Frequency: 5x/week OT Frequency: 2x/week            Contractures      Additional Factors Info  Code Status, Allergies Code Status Info: Full Allergies Info: NKA           Current Medications (12/15/2015):  This is the current hospital active medication list Current Facility-Administered Medications  Medication Dose Route Frequency Provider Last Rate Last Dose  . acetaminophen (TYLENOL) tablet 650 mg  650 mg Oral Q6H PRN Joseph ArtJessica U Vann, DO   650 mg at 12/14/15 1551   Or  . acetaminophen (TYLENOL) suppository 650 mg  650 mg Rectal Q6H PRN Joseph ArtJessica U Vann, DO      . amLODipine (NORVASC) tablet 10 mg  10 mg Oral Daily Joseph ArtJessica U Vann, DO   10 mg at 12/14/15 0957  . memantine (NAMENDA) tablet 10 mg  10 mg Oral Daily Joseph ArtJessica U Vann, DO   10 mg at 12/14/15 0957  . multivitamin with minerals tablet 1 tablet  1 tablet Oral Daily Joseph ArtJessica U Vann, DO   1 tablet at 12/14/15 0956  . pravastatin (PRAVACHOL) tablet 80 mg  80 mg Oral QHS Joseph ArtJessica U Vann, DO   80 mg at 12/14/15 2205  . rivaroxaban (XARELTO) tablet 20 mg  20 mg Oral Q supper Joseph ArtJessica U Vann, DO   20 mg at 12/14/15 1553  . sodium chloride flush (NS) 0.9 % injection 3 mL  3 mL Intravenous Q12H Joseph ArtJessica U Vann, DO  3 mL at 12/14/15 2205  . terazosin (HYTRIN) capsule 1 mg  1 mg Oral QHS Joseph ArtJessica U Vann, DO   1 mg at 12/14/15 2205     Discharge Medications: Please see discharge summary for a list of discharge medications.  Relevant Imaging Results:  Relevant Lab Results:   Additional Information SSN: [id number]  Delberta Folts S [place], [place]

## 2015-12-15 NOTE — Consult Note (Signed)
Reason for Consult: Left upper extremity pains, left ankle pain Referring Physician: Jerral RalphGhimire, MD  Calvin Diaz is an 75 y.o. male.  HPI: Calvin Diaz is a 75 y.o. 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 He was noted to be febrile due to SIRS and was started on IVF and IV antibiotic empirically.  Patient with diagnosis of A fib with plans for DCCV and ran out of Xarelto for a couple of weeks --resumed by cardiology a week ago. MRI brain done revealing small area of acute ischemia superimposed on area of encephalomalacia associated with remote R-MCA infarct along postcentral gyrus. Large areas of encephalomalacia in R-MCA and left frontal lobe. Recent echo 08/2015 with EF 60-65% and no wall abnormality.  Lactic acid/infectious work up negative therefore antibiotics discontinued  I was asked to see based on left upper extremity concerns specifically his left elbow and shoulder as well as left ankle  Past Medical History:  Diagnosis Date  . Dementia   . High cholesterol   . Hypertension   . Myocardial infarction   . Stroke [facility](HCC)     Past Surgical History:  Procedure Laterality Date  . CORONARY ANGIOPLASTY      Family History  Problem Relation Age of Onset  . Arrhythmia Mother     Had pacemaker  . Stroke Maternal Aunt     Social History:  reports that he has quit smoking. His smoking use included Cigarettes. He quit smokeless tobacco use about 3 months ago. He reports that he does not drink alcohol or use drugs.  Allergies: No Known Allergies  Medications:  I have reviewed the patient's current medications. Scheduled: . memantine  10 mg Oral Daily  . multivitamin with minerals  1 tablet Oral Daily  . potassium chloride  40 mEq Oral  Once  . pravastatin  80 mg Oral QHS  . rivaroxaban  20 mg Oral Q supper  . sodium chloride flush  3 mL Intravenous Q12H  . terazosin  1 mg Oral QHS    Results for orders placed or performed during the hospital encounter of 12/13/15 (from the past 24 hour(s))  Basic metabolic panel     Status: Abnormal   Collection Time: 12/15/15  9:11 AM  Result Value Ref Range   Sodium 136 135 - 145 mmol/L   Potassium 3.2 (L) 3.5 - 5.1 mmol/L   Chloride 104 101 - 111 mmol/L   CO2 24 22 - 32 mmol/L   Glucose, Bld 132 (H) 65 - 99 mg/dL   BUN 8 6 - 20 mg/dL   Creatinine, Ser 4.780.65 0.61 - 1.24 mg/dL   Calcium 8.5 (L) 8.9 - 10.3 mg/dL   GFR calc non Af Amer >60 >60 mL/min   GFR calc Af Amer >60 >60 mL/min   Anion gap 8 5 - 15  Magnesium     Status: None   Collection Time: 12/15/15  9:11 AM  Result Value Ref Range   Magnesium 1.8 1.7 - 2.4 mg/dL  Uric acid     Status: Abnormal   Collection Time: 12/15/15  2:56 PM  Result Value Ref Range   Uric Acid, Serum 2.8 (L) 4.4 - 7.6 mg/dL  Basic metabolic panel     Status: Abnormal   Collection Time: 12/15/15  2:56 PM  Result Value Ref Range   Sodium 134 (L) 135 - 145 mmol/L   Potassium 3.2 (L) 3.5 - 5.1 mmol/L   Chloride 99 (L) 101 - 111 mmol/L   CO2 26 22 - 32 mmol/L   Glucose, Bld 124 (H) 65 - 99 mg/dL   BUN 9 6 - 20 mg/dL   Creatinine, Ser 1.610.76 0.61 - 1.24 mg/dL   Calcium 8.7 (L) 8.9 - 10.3 mg/dL   GFR calc non Af Amer >60 >60 mL/min   GFR calc Af Amer >60 >60 mL/min   Anion gap 9 5 - 15  Rapid urine drug screen (hospital performed)     Status: None   Collection Time: 12/15/15  3:35 PM  Result Value Ref Range   Opiates NONE DETECTED NONE DETECTED   Cocaine NONE DETECTED NONE DETECTED   Benzodiazepines NONE DETECTED NONE DETECTED   Amphetamines NONE DETECTED NONE DETECTED   Tetrahydrocannabinol NONE DETECTED NONE DETECTED   Barbiturates NONE DETECTED NONE DETECTED    X-ray: None ordered to date  ROS  History of dementia, results of  questioning unclear, no family around Otherwise positive for that in HPI  Blood pressure (!) 129/107, pulse (!) 101, temperature 98 F (36.7 C), temperature source Oral, resp. rate 20, height 5\' 10"  (1.778 m), weight 59.6 kg (131 lb 6.3 oz), SpO2 95 %.  Physical Exam  Awake, alert Confusion evident with attempted conversation  Left ankle - no warmth, no palpable effusion, some discomfort expressed with movement but stiff Left UE -  Entirety warmer than right from wrist to above elbow No obvious cellulitis Left shoulder palpable landmarks due to atrophy, no palpable effusion Left elbow stiff related to CVA and contracture Pain with movement but no obviously large palpable effusion Wrist warmth no effusion noted    Assessment/Plan: Pain, warmth Left UE uncertain etiology As reviewed differential could include gout (though UA level 2.8) versus infection  After examining him I do not feel there is enough effusion to aspirate to clear up diagnosis Upon review with admitting physician the plan will be to treat empirically for gout with close observation for improvement, the lack of improvement, or worsening Start a prednisone taper and colchcine Will monitor progress I feel that this has better chance of improving versus worsening  Calvin Diaz D 12/15/2015, 4:45 PM

## 2015-12-15 NOTE — Progress Notes (Signed)
Physical Therapy Treatment Patient Details Name: Jeani HawkingJames Iannacone MRN: [id number] DOB: April 20, 1940 Today's Date: 12/15/2015    History of Present Illness Calvin LowensteinJames Theirseis an 75 y.o.male(per chart) medical history significant of dementia, HTn, a fib, HLD. Per family who was at the bedside initially but not present duringmy exam patient has been declining in health 2 weeks.MRI of brain showed acute on subacute infarct on the Right.    PT Comments    Pt performed increased activity progressing to standing and short steps from bed to chair with RW.  Initially patient found with urinary incontinence after faulty condom cath was observed. CNA into change and assisted therapist in perianal care.  Pt continues to be fearful of falling but as tx progressed anxiety decreased.  Will continue to recommend rehab in post acute setting to improve strength and decreased caregiver burden before returning home.     Follow Up Recommendations  CIR;Supervision/Assistance - 24 hour     Equipment Recommendations   (TBD at next venue. )    Recommendations for Other Services       Precautions / Restrictions Precautions Precautions: Fall Precaution Comments: h/o dementia Restrictions Weight Bearing Restrictions: No    Mobility  Bed Mobility Overal bed mobility: Needs Assistance;+2 for physical assistance Bed Mobility: Rolling;Supine to Sit Rolling: Max assist;+2 for physical assistance   Supine to sit: Max assist;+2 for physical assistance     General bed mobility comments: HOB elevated with use of bed pad to advance LEs to edge of bed.  Pt required assist with B LEs and upper trunk contol.    Transfers Overall transfer level: Needs assistance Equipment used: Rolling walker (2 wheeled) Transfers: Sit to/from Stand Sit to Stand: Mod assist;+2 physical assistance Stand pivot transfers: Mod assist;+2 physical assistance       General transfer comment: Pt remains resistant due to fear of  falling.  R hand placed on RW to improve forward weight shifting and ease patient's anxiety.  After standing patient attempting to reach with LUE for hand grip on RW, PTA assisted L hand to RW.  Pt required assist to boost patient into standing.  Pt performed steps to chair with +2 mod assist.  pt then required cues to reach for surface with R hand while PTA guarded LUE.  pt reports feeling like he is falling.  PTA cued patient that he is sitting and anxiety diminshed.  Remains rigid throughout movements.    Ambulation/Gait Ambulation/Gait assistance: Mod assist;+2 physical assistance Ambulation Distance (Feet): 6 Feet (series of forward, sideways and backward steps to advance from bed to chair.  ) Assistive device: Rolling walker (2 wheeled) Gait Pattern/deviations: Step-to pattern;Shuffle;Trunk flexed;Leaning posteriorly   Gait velocity interpretation: Below normal speed for age/gender     Stairs            Wheelchair Mobility    Modified Rankin (Stroke Patients Only)       Balance Overall balance assessment: Needs assistance   Sitting balance-Leahy Scale: Poor       Standing balance-Leahy Scale: Poor                      Cognition Arousal/Alertness: Awake/alert Behavior During Therapy: WFL for tasks assessed/performed Overall Cognitive Status: Impaired/Different from baseline (aware patient with dementia.  ) Area of Impairment: Attention;Following commands;Awareness;Safety/judgement;Problem solving   Current Attention Level: Focused   Following Commands: Follows one step commands with increased time;Follows one step commands inconsistently Safety/Judgement: Decreased awareness of safety;Decreased awareness of deficits Awareness:  Intellectual Problem Solving: Slow processing;Decreased initiation;Difficulty sequencing;Requires verbal cues;Requires tactile cues General Comments: pt with L sided neglect    Exercises      General Comments         Pertinent Vitals/Pain Pain Assessment: 0-10 Pain Score: 10-Worst pain ever Faces Pain Scale: Hurts worst Pain Location: generalized pain through out, grimmacing with movement Pain Descriptors / Indicators: Grimacing;Guarding;Sore;Operative site guarding Pain Intervention(s): Monitored during session;Repositioned    Home Living                      Prior Function            PT Goals (current goals can now be found in the care plan section) Acute Rehab PT Goals Patient Stated Goal: didn't state Potential to Achieve Goals: Good Progress towards PT goals: Progressing toward goals    Frequency    Min 3X/week      PT Plan Current plan remains appropriate    Co-evaluation PT/OT/SLP Co-Evaluation/Treatment: Yes Reason for Co-Treatment: Complexity of the patient's impairments (multi-system involvement);Necessary to address cognition/behavior during functional activity;For patient/therapist safety PT goals addressed during session: Mobility/safety with mobility;Balance OT goals addressed during session: ADL's and self-care     End of Session Equipment Utilized During Treatment: Gait belt Activity Tolerance: Patient limited by pain;Patient limited by fatigue Patient left: in chair;with call bell/phone within reach;with chair alarm set     Time: 1029-1102 PT Time Calculation (min) (ACUTE ONLY): 33 min  Charges:  $Therapeutic Activity: 8-22 mins                    G Codes:      Florestine Aversimee J Charell Faulk 12/15/2015, 11:09 AM  Joycelyn RuaAimee Charita Lindenberger, PTA pager [phone]

## 2015-12-15 NOTE — Progress Notes (Signed)
   12/15/15 0950  Clinical Encounter Type  Visited With Patient and family together  Visit Type Other (Comment) ([place] consult-AD)  Referral From Nurse  Consult/Referral To Chaplain  Spiritual Encounters  Spiritual Needs Other (Comment) (Advanced Directive)  Stress Factors  Patient Stress Factors Health changes  Family Stress Factors Family relationships  Provided copy and explained Advanced Directive. Check back later.

## 2015-12-15 NOTE — Progress Notes (Signed)
Initial Nutrition Assessment  DOCUMENTATION CODES:   Not applicable  INTERVENTION:   -Ensure Enlive po BID, each supplement provides 350 kcal and 20 grams of protein -MVI  NUTRITION DIAGNOSIS:   Inadequate oral intake related to poor appetite as evidenced by meal completion < 50%.  GOAL:   Patient will meet greater than or equal to 90% of their needs  MONITOR:   PO intake, Supplement acceptance, Diet advancement, Labs, Weight trends, Skin, I & O's  REASON FOR ASSESSMENT:   Low Braden    ASSESSMENT:   Calvin Diaz is a 75 y.o. male with medical history significant of dementia, HTn, a fib, HLD.  Per family who was at the bedside initially but not present during my exam patient has been declining in health 2 weeks.  Patient normally can ambulate by himself but since Friday he has been unable to do so. He also now has slurred speech as well as decreased movement in his left arm.  The family did not answer the phone when I called to clarify so all history is obtained from the record.  Pt admitted with fever with polyarticular arthritis.   Reviewed neurology notes, which revealed MRI showed rt subactue infarct.   Hx obtained from pt wife at bedside. She reports pt typically has a great appetite and "eats everything". Favorite foods include fried chicken. Pt consumes 3-4 meals per day, however, wife reports poor oral intake over the past 1-2 weeks, which is very unusual for pt. During this time, pt experienced a general decline in health. At baseline, pt wife describes pt as very active, but now requires assistance (noted that pt wife has to feed him lunch, but consumed 100%). Meal completion 40-60%, per doc flowsheets.   Pt wife reports pt has always been tall and thin in stature. Per wt hx, wt hx has been stable over the past 6 months.   Nutrition-Focused physical exam completed. Findings are no fat depletion, mild muscle depletion, and no edema.   Pt wife concerned about pt's  decline in intake PTA. Discussed options for increasing PO intake, including addition of supplements and MVI, which wife is amenable to. Per pt wife, pt took MVI daily at home and would like to continue for continuity of care.   Labs reviewed: Na: 132, K: 3.2 (on PO supplementation).   Diet Order:  DIET DYS 2 Room service appropriate? Yes; Fluid consistency: Thin  Skin:  Reviewed, no issues  Last BM:  12/15/15  Height:   Ht Readings from Last 1 Encounters:  12/13/15 5\' 10"  (1.778 m)    Weight:   Wt Readings from Last 1 Encounters:  12/15/15 131 lb 6.3 oz (59.6 kg)    Ideal Body Weight:  75.5 kg  BMI:  Body mass index is 18.85 kg/m.  Estimated Nutritional Needs:   Kcal:  1500-1700  Protein:  70-85 grams  Fluid:  1.5-1.7 L  EDUCATION NEEDS:   Education needs addressed  Nick Stults A. Mayford KnifeWilliams, RD, LDN, CDE Pager: [phone] After hours Pager: [phone]

## 2015-12-15 NOTE — Clinical Social Work Note (Signed)
Clinical Social Work Assessment  Patient Details  Name: Calvin Diaz MRN: [id number] Date of Birth: 23-May-1940  Date of referral:  12/15/15               Reason for consult:  Facility Placement                Permission sought to share information with:  Facility Sport and exercise psychologist, Family Supports Permission granted to share information::  No (Disoriented)  Name::     Therapist, occupational::  SNFs  Relationship::  Spouse  Contact Information:  [phone]  Housing/Transportation Living arrangements for the past 2 months:  Single Family Home Source of Information:  Adult Children, Spouse Patient Interpreter Needed:  None Criminal Activity/Legal Involvement Pertinent to Current Situation/Hospitalization:  No - Comment as needed Significant Relationships:  Adult Children, Spouse Lives with:  Spouse Do you feel safe going back to the place where you live?  No Need for family participation in patient care:  Yes (Comment)  Care giving concerns:  CSW received consult for possible SNF placement at time of discharge. Patient is disoriented. CSW met with patient and patient's spouse and daughter at bedside regarding PT recommendation of SNF placement at time of discharge. Per patient's spouse, patient's spouse is currently unable to care for patient at their home given patient's current physical needs and fall risk. Patient and patient's spouse and daughter expressed understanding of PT recommendation and are agreeable to SNF placement at time of discharge. CSW to continue to follow and assist with discharge planning needs.   Social Worker assessment / plan:  CSW spoke with patient and patient's spouse concerning possibility of rehab at [facility] before returning home.  Employment status:  Retired Forensic scientist:  [employer] PT Recommendations:  [place], [place] / Referral to community resources:  [place]  Patient/Family's  Response to care:  Patient's daughter and patient's spouse recognize need for rehab before returning home and are agreeable to a SNF in [place]. They are hopeful that patient will get stronger.  Patient/Family's Understanding of and Emotional Response to Diagnosis, Current Treatment, and Prognosis:  Patient/family is realistic regarding therapy needs and expressed being hopeful for SNF placement. Patient's daughter expressed understanding of CSW role and discharge process. No questions/concerns about plan or treatment.    Emotional Assessment Appearance:  Appears stated age Attitude/Demeanor/Rapport:  Unable to Assess Affect (typically observed):  Unable to Assess Orientation:  Oriented to Self, Oriented to Place Alcohol / Substance use:  Not Applicable Psych involvement (Current and /or in the community):  No (Comment)  Discharge Needs  Concerns to be addressed:  Care Coordination Readmission within the last 30 days:  No Current discharge risk:  None Barriers to Discharge:  Continued Medical Work up   [employer], [place] 12/15/2015, 5:51 PM

## 2015-12-16 DIAGNOSIS — R410 Disorientation, unspecified: Secondary | ICD-10-CM | POA: Diagnosis present

## 2015-12-16 LAB — LIPID PANEL
CHOL/HDL RATIO: 2.8 ratio
CHOLESTEROL: 100 mg/dL (ref 0–200)
HDL: 36 mg/dL — AB (ref >40)
LDL Cholesterol: 55 mg/dL (ref 0–99)
Triglycerides: 47 mg/dL (ref <150)
VLDL: 9 mg/dL (ref 0–40)

## 2015-12-16 LAB — BASIC METABOLIC PANEL
Anion gap: 9 (ref 5–15)
BUN: 11 mg/dL (ref 6–20)
CHLORIDE: 100 mmol/L — AB (ref 101–111)
CO2: 24 mmol/L (ref 22–32)
CREATININE: 0.77 mg/dL (ref 0.61–1.24)
Calcium: 8.7 mg/dL — ABNORMAL LOW (ref 8.9–10.3)
GFR calc Af Amer: >60 mL/min (ref >60)
GFR calc non Af Amer: >60 mL/min (ref >60)
Glucose, Bld: 142 mg/dL — ABNORMAL HIGH (ref 65–99)
POTASSIUM: 4.3 mmol/L (ref 3.5–5.1)
SODIUM: 133 mmol/L — AB (ref 135–145)

## 2015-12-16 MED ORDER — COLCHICINE 0.6 MG PO TABS
0.6000 mg | ORAL_TABLET | Freq: Once | ORAL | Status: AC
  Administered 2015-12-16: 0.6 mg via ORAL
  Filled 2015-12-16: qty 1

## 2015-12-16 MED ORDER — COLCHICINE 0.6 MG PO TABS
1.2000 mg | ORAL_TABLET | Freq: Once | ORAL | Status: AC
  Administered 2015-12-16: 1.2 mg via ORAL
  Filled 2015-12-16: qty 2

## 2015-12-16 NOTE — Progress Notes (Addendum)
PROGRESS NOTE        PATIENT DETAILS Name: Calvin Diaz Age: 75 y.o. Sex: male Date of Birth: [birth year] Admit Date: 12/13/2015 Admitting Physician Joseph ArtJessica U Vann, DO ZOX:WRUEAVW,UJWJPCP:LALONDE,JOHN CHARLES, MD  Brief Narrative: Patient is a 75 y.o. male recent CVA, atrial fibrillation on anticoagulation who presented to the ED with 2-3 week history of waxing and waning confusion, intermittent slurred speech and inability to ambulate due to pain in his left upper and lower extremity. See below for further details.  Subjective: Much more awake and alert. Unchanged left elbow pain. Thinks he has less pain in his left ankle. Poor historian. .  Assessment/Plan: Fever with Polyarticular arthritis: No further fever since admission. Seen by orthopedics, not enough effusion in his joints for arthrocentesis-recommendations were to empirically start anti-inflammatory agents-started on steroids on 11/22-with some minimal improvement. Will reassess on 11/24. Uric acid level was not elevated, blood cultures continue to be negative.   ? Acute CVA: Poor historian-he does have some amount of dementia at baseline-not sure if he is unable to move his left side due to pain from polyarticular arthritis or he has had another small CVA. MRI brain shows a possible small acute CVA in the right parietal lobe-stroke team following-continue Xarelto for now. Await further recommendations from stroke team.  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   Hypertension: Controlled, continue amlodipine and Terazosin  Hypokalemia: Replete and recheck  Dementia with mild  delirium: Continue Namenda.  Chronic insomnia: Resume belsomra as outpatient  Patchy apical lung opacities: Seen on CT angiogram of the neck, no obvious infiltrates seen on chest x-ray. Radiology recommends to repeat CT in 6 months.   DVT Prophylaxis: Full dose anticoagulation with Xarelto  Code Status: DNR  Family Communication: None at bedside  Disposition Plan: Remain inpatient- SNF on discharge  Antimicrobial agents: See below  Procedures: None  CONSULTS:  neurology  Time spent: 25 minutes-Greater than 50% of this time was spent in counseling, explanation of diagnosis, planning of further management, and coordination of care.  MEDICATIONS: Anti-infectives    Start     Dose/Rate Route Frequency Ordered Stop   12/14/15 0600  vancomycin (VANCOCIN) 500 mg in sodium chloride 0.9 % 100 mL IVPB  Status:  Discontinued     500 mg 100 mL/hr over 60 Minutes Intravenous Every 12 hours 12/13/15 1752 12/13/15 2049   12/13/15 2300  piperacillin-tazobactam (ZOSYN) IVPB 3.375 g  Status:  Discontinued     3.375 g 12.5 mL/hr over 240 Minutes Intravenous Every 8 hours 12/13/15 1752 12/13/15 2049   12/13/15 1700  vancomycin (VANCOCIN) IVPB 1000 mg/200 mL premix     1,000 mg 200 mL/hr over 60 Minutes Intravenous  Once 12/13/15 1653 12/13/15 2006   12/13/15 1700  piperacillin-tazobactam (ZOSYN) IVPB 3.375 g     3.375 g 100 mL/hr over 30 Minutes Intravenous  Once 12/13/15 1653 12/13/15 1747      Scheduled Meds: . feeding supplement (ENSURE ENLIVE)  237 mL Oral BID BM  . memantine  10 mg Oral Daily  . multivitamin with minerals  1 tablet  Oral Daily  . pravastatin  80 mg Oral QHS  . predniSONE  40 mg Oral Q breakfast  . rivaroxaban  20 mg Oral Q supper  . sodium chloride flush  3 mL Intravenous Q12H  . terazosin  1 mg Oral QHS   Continuous Infusions: PRN Meds:.acetaminophen **OR** acetaminophen   PHYSICAL EXAM: Vital signs: Vitals:   12/15/15 1302 12/15/15 2150 12/15/15  2201 12/16/15 0455  BP: (!) 129/107 126/66 124/67 110/63  Pulse: (!) 101 96 95 87  Resp: 20 20 20 20   Temp: 98 F (36.7 C) 98.2 F (36.8 C) 99.6 F (37.6 C) 98.4 F (36.9 C)  TempSrc: Oral Oral Oral Oral  SpO2: 95% 100% 100% 100%  Weight:      Height:       Filed Weights   12/13/15 2151 12/15/15 0553  Weight: 56.5 kg (124 lb 9 oz) 59.6 kg (131 lb 6.3 oz)   Body mass index is 18.85 kg/m.   General appearance :AwakeAnd mostly alert. Answers most of my questions appropriately. Eyes:, pupils equally reactive to light and accomodation,no scleral icterus.Pink conjunctiva HEENT: Atraumatic and Normocephalic Neck: supple, no JVD. No cervical lymphadenopathy. No thyromegaly Resp:Good air entry bilaterally, no added sounds  CVS: S1 S2 irregular.  GI: Bowel sounds present, Non tender and not distended with no gaurding, rigidity or rebound.No organomegaly Extremities: B/L Lower Ext shows no edema, both legs are warm to touch Neurology: Hard to discern  left upper and left lower extremity weakness is due to pain from swollen left ankle and left elbow-but after multiple attempts-seems able to lift both left upper/lower extremity against gravity but not against resistance. 5/5 in right upper and right lower extremity. Musculoskeletal:No digital cyanosis Skin:No Rash, warm and dry Wounds:N/A  I have personally reviewed following labs and imaging studies  LABORATORY DATA: CBC:  Recent Labs Lab 12/13/15 1615 12/14/15 0250  WBC 13.1* 11.1*  NEUTROABS 10.3*  --   HGB 12.5* 11.4*  HCT 36.9* 34.3*  MCV 88.7 89.6  PLT 416* 403*    Basic Metabolic Panel:  Recent Labs Lab 12/13/15 1615 12/14/15 0250 12/15/15 0911 12/15/15 1456 12/16/15 0445  NA 141 141 136 134* 133*  K 3.0* 3.1* 3.2* 3.2* 4.3  CL 102 105 104 99* 100*  CO2 29 28 24 26 24   GLUCOSE 132* 114* 132* 124* 142*  BUN 20 14 8 9 11   CREATININE 1.14 0.87 0.65 0.76 0.77  CALCIUM 9.2 8.8* 8.5* 8.7* 8.7*  MG  --   --  1.8   --   --     GFR: Estimated Creatinine Clearance: 68.3 mL/min (by C-G formula based on SCr of 0.77 mg/dL).  Liver Function Tests:  Recent Labs Lab 12/13/15 1615  AST 34  ALT 24  ALKPHOS 65  BILITOT 0.4  PROT 8.1  ALBUMIN 2.8*    Recent Labs Lab 12/13/15 1615  LIPASE 20   No results for input(s): AMMONIA in the last 168 hours.  Coagulation Profile: No results for input(s): INR, PROTIME in the last 168 hours.  Cardiac Enzymes:  Recent Labs Lab 12/13/15 2102 12/14/15 0250  TROPONINI 0.03* 0.03*    BNP (last 3 results) No results for input(s): PROBNP in the last 8760 hours.  HbA1C: No results for input(s): HGBA1C in the last 72 hours.  CBG: No results for input(s): GLUCAP in the last 168 hours.  Lipid Profile:  Recent Labs  12/16/15 0445  CHOL 100  HDL 36*  LDLCALC 55  TRIG  47  CHOLHDL 2.8    Thyroid Function Tests:  Recent Labs  12/13/15 1615  TSH 1.220    Anemia Panel: No results for input(s): VITAMINB12, FOLATE, FERRITIN, TIBC, IRON, RETICCTPCT in the last 72 hours.  Urine analysis:    Component Value Date/Time   COLORURINE AMBER (A) 12/13/2015 1602   APPEARANCEUR CLEAR 12/13/2015 1602   LABSPEC 1.023 12/13/2015 1602   PHURINE 6.0 12/13/2015 1602   GLUCOSEU NEGATIVE 12/13/2015 1602   HGBUR NEGATIVE 12/13/2015 1602   BILIRUBINUR SMALL (A) 12/13/2015 1602   KETONESUR NEGATIVE 12/13/2015 1602   PROTEINUR 30 (A) 12/13/2015 1602   NITRITE NEGATIVE 12/13/2015 1602   LEUKOCYTESUR NEGATIVE 12/13/2015 1602    Sepsis Labs: Lactic Acid, Venous    Component Value Date/Time   LATICACIDVEN 0.73 12/13/2015 1931    MICROBIOLOGY: Recent Results (from the past 240 hour(s))  Culture, blood (Routine X 2) w Reflex to ID Panel     Status: None (Preliminary result)   Collection Time: 12/13/15  5:07 PM  Result Value Ref Range Status   Specimen Description BLOOD LEFT FOREARM  Final   Special Requests BOTTLES DRAWN AEROBIC AND ANAEROBIC 5CC   Final   Culture NO GROWTH 2 DAYS  Final   Report Status PENDING  Incomplete  Culture, blood (Routine X 2) w Reflex to ID Panel     Status: None (Preliminary result)   Collection Time: 12/13/15  5:07 PM  Result Value Ref Range Status   Specimen Description BLOOD RIGHT FOREARM  Final   Special Requests BOTTLES DRAWN AEROBIC AND ANAEROBIC 5CC  Final   Culture NO GROWTH 2 DAYS  Final   Report Status PENDING  Incomplete    RADIOLOGY STUDIES/RESULTS: Ct Angio Head W Or Wo Contrast  Result Date: 12/15/2015 CLINICAL DATA:  History of dementia. Slurred speech and decreased movement in the left arm. EXAM: CT ANGIOGRAPHY HEAD AND NECK TECHNIQUE: Multidetector CT imaging of the head and neck was performed using the standard protocol during bolus administration of intravenous contrast. Multiplanar CT image reconstructions and MIPs were obtained to evaluate the vascular anatomy. Carotid stenosis measurements (when applicable) are obtained utilizing NASCET criteria, using the distal internal carotid diameter as the denominator. CONTRAST:  50 cc Isovue 370 intravenous COMPARISON:  Brain MRI from yesterday FINDINGS: CT HEAD FINDINGS Brain: Acute on chronic ischemia in the right MCA territory is poorly seen by CT. No acute hemorrhage. Remote left MCA territory artifact involving large portion of the lateral left frontal lobe atrophy with ventriculomegaly. Vascular: Extensive atherosclerotic calcification. Skull: Negative Sinuses: Negative Orbits: Negative Review of the MIP images confirms the above findings CTA NECK FINDINGS Aortic arch: Extensive atherosclerotic plaque. No aneurysm or dissection. Right carotid system: Atherosclerotic plaque predominately at the carotid bifurcation and in the proximal ICA with no flow limiting stenosis of 50% or greater. No ulceration or dissection. Second mixed density plaque in the distal ICA at the skullbase without stenosis. No vessel beading. Left carotid system: Mild ostial  narrowing due to mixed density plaque. Predominantly calcified plaque at the carotid bifurcation without ICA stenosis. Prominent calcified plaque in the ICA at the skullbase without flow limiting stenosis. No ulceration or dissection. Vertebral arteries: No proximal flow limiting stenosis in the subclavian arteries. Moderate proximal left vertebral artery stenosis. Mild right ostial stenosis at calcified plaque. No evidence of dissection. Skeleton: No acute or aggressive finding. Advanced cervical disc degeneration. Other neck: No incidental mass or adenopathy. Upper chest: There are spiculated reticular nodular densities at the  apices, some containing central calcification at the right apex. This calcification favors postinfectious scarring. Need correlation for acute infectious symptoms the malignancy history. Review of the MIP images confirms the above findings CTA HEAD FINDINGS Anterior circulation: Extensive calcified plaque on the carotid siphons. Assessment of luminal narrowing is limited by blooming calcium, no flow limiting stenosis suspected. No reversible flow limiting stenosis in the proximal circulation. Negative for aneurysm. Complete circle-of-Willis. Posterior circulation: Symmetric vertebral arteries. Smooth and widely patent vertebral and basilar arteries. No major branch occlusion or flow limiting stenosis. Venous sinuses: Patent as permitted by contrast timing Anatomic variants: None, Delayed phase: No parenchymal enhancement or mass Review of the MIP images confirms the above findings IMPRESSION: 1. No acute arterial finding. No embolic source or flow limiting stenosis in the right anterior circulation to explain acute infarcts. 2. Moderate left V1 segment stenosis. 3. Advanced chronic ischemic injury which obscures the acute right MCA territory infarct by CT. No hemorrhagic conversion or evolution since brain MRI yesterday. 4. Patchy apical lung opacities that are nonspecific. Some contain  central calcifications on the right, favoring remote infection. Recommend chest x-ray correlation to ensure no acute opacities compared to recent priors. If clinically appropriate, chest CT in 6 months could follow-up the small nodular areas. Electronically Signed   By: Marnee SpringJonathon  Watts M.D.   On: 12/15/2015 17:00   Dg Chest 2 View  Result Date: 12/07/2015 CLINICAL DATA:  Cough for 2 weeks, hypertension. EXAM: CHEST  2 VIEW COMPARISON:  09/06/2015 FINDINGS: There is hyperinflation of the lungs compatible with COPD. Heart and mediastinal contours are within normal limits. No focal opacities or effusions. No acute bony abnormality. IMPRESSION: COPD.  No active disease. Electronically Signed   By: Charlett NoseKevin  Dover M.D.   On: 12/07/2015 16:06   Dg Elbow 2 Views Left  Result Date: 12/15/2015 CLINICAL DATA:  Swelling and pain to LEFT shoulder, LEFT ankle on LEFT elbow today, no known injury EXAM: LEFT ELBOW - 2 VIEW COMPARISON:  None FINDINGS: Mild osseous demineralization. Joint spaces preserved. Small elbow joint effusion. No acute fracture, dislocation, or bone destruction. Question soft tissue swelling and elbow region. IMPRESSION: Small joint effusion. No acute osseous abnormalities otherwise identified. Electronically Signed   By: Ulyses SouthwardMark  Boles M.D.   On: 12/15/2015 17:15   Dg Ankle 2 Views Left  Result Date: 12/15/2015 CLINICAL DATA:  Swelling and pain to LEFT shoulder, LEFT ankle on LEFT elbow today, no known injury EXAM: LEFT ANKLE - 2 VIEW COMPARISON:  None FINDINGS: Osseous demineralization. Joint spaces preserved. Mild soft tissue swelling greatest laterally. Superimposed artifacts at calcaneus on lateral view. No acute fracture, dislocation, or bone destruction. IMPRESSION: No acute osseous abnormalities. Electronically Signed   By: Ulyses SouthwardMark  Boles M.D.   On: 12/15/2015 17:15   Ct Head Wo Contrast  Result Date: 12/07/2015 CLINICAL DATA:  LEFT side pain weakness for 3 months, history of stroke on  06/23/2015, dementia, hypertension, former smoker EXAM: CT HEAD WITHOUT CONTRAST TECHNIQUE: Contiguous axial images were obtained from the base of the skull through the vertex without intravenous contrast. COMPARISON:  Non FINDINGS: Brain: Generalized atrophy. Mild ex vacuo dilatation of the ventricular system. No midline shift or mass effect. LEFT frontal infarct. Larger RIGHT frontal and parietal infarcts. BILATERAL frontal infarcts appear old. RIGHT parietal infarct is likely subacute to old. No intracranial hemorrhage, mass lesion or extra-axial fluid collection. Vascular: Scattered atherosclerotic calcifications at the carotid siphons bilaterally Skull: Intact Sinuses/Orbits: Clear Other: N/A IMPRESSION: Atrophy with small vessel  chronic ischemic changes of deep cerebral white matter. LEFT frontal and RIGHT frontal/parietal infarcts as above without intracranial hemorrhage. Electronically Signed   By: Ulyses Southward M.D.   On: 12/07/2015 14:22   Ct Angio Neck W Or Wo Contrast  Result Date: 12/15/2015 CLINICAL DATA:  History of dementia. Slurred speech and decreased movement in the left arm. EXAM: CT ANGIOGRAPHY HEAD AND NECK TECHNIQUE: Multidetector CT imaging of the head and neck was performed using the standard protocol during bolus administration of intravenous contrast. Multiplanar CT image reconstructions and MIPs were obtained to evaluate the vascular anatomy. Carotid stenosis measurements (when applicable) are obtained utilizing NASCET criteria, using the distal internal carotid diameter as the denominator. CONTRAST:  50 cc Isovue 370 intravenous COMPARISON:  Brain MRI from yesterday FINDINGS: CT HEAD FINDINGS Brain: Acute on chronic ischemia in the right MCA territory is poorly seen by CT. No acute hemorrhage. Remote left MCA territory artifact involving large portion of the lateral left frontal lobe atrophy with ventriculomegaly. Vascular: Extensive atherosclerotic calcification. Skull: Negative  Sinuses: Negative Orbits: Negative Review of the MIP images confirms the above findings CTA NECK FINDINGS Aortic arch: Extensive atherosclerotic plaque. No aneurysm or dissection. Right carotid system: Atherosclerotic plaque predominately at the carotid bifurcation and in the proximal ICA with no flow limiting stenosis of 50% or greater. No ulceration or dissection. Second mixed density plaque in the distal ICA at the skullbase without stenosis. No vessel beading. Left carotid system: Mild ostial narrowing due to mixed density plaque. Predominantly calcified plaque at the carotid bifurcation without ICA stenosis. Prominent calcified plaque in the ICA at the skullbase without flow limiting stenosis. No ulceration or dissection. Vertebral arteries: No proximal flow limiting stenosis in the subclavian arteries. Moderate proximal left vertebral artery stenosis. Mild right ostial stenosis at calcified plaque. No evidence of dissection. Skeleton: No acute or aggressive finding. Advanced cervical disc degeneration. Other neck: No incidental mass or adenopathy. Upper chest: There are spiculated reticular nodular densities at the apices, some containing central calcification at the right apex. This calcification favors postinfectious scarring. Need correlation for acute infectious symptoms the malignancy history. Review of the MIP images confirms the above findings CTA HEAD FINDINGS Anterior circulation: Extensive calcified plaque on the carotid siphons. Assessment of luminal narrowing is limited by blooming calcium, no flow limiting stenosis suspected. No reversible flow limiting stenosis in the proximal circulation. Negative for aneurysm. Complete circle-of-Willis. Posterior circulation: Symmetric vertebral arteries. Smooth and widely patent vertebral and basilar arteries. No major branch occlusion or flow limiting stenosis. Venous sinuses: Patent as permitted by contrast timing Anatomic variants: None, Delayed phase: No  parenchymal enhancement or mass Review of the MIP images confirms the above findings IMPRESSION: 1. No acute arterial finding. No embolic source or flow limiting stenosis in the right anterior circulation to explain acute infarcts. 2. Moderate left V1 segment stenosis. 3. Advanced chronic ischemic injury which obscures the acute right MCA territory infarct by CT. No hemorrhagic conversion or evolution since brain MRI yesterday. 4. Patchy apical lung opacities that are nonspecific. Some contain central calcifications on the right, favoring remote infection. Recommend chest x-ray correlation to ensure no acute opacities compared to recent priors. If clinically appropriate, chest CT in 6 months could follow-up the small nodular areas. Electronically Signed   By: Marnee Spring M.D.   On: 12/15/2015 17:00   Mr Brain Wo Contrast  Result Date: 12/14/2015 CLINICAL DATA:  Declining health for 2 weeks. Difficulty ambulating. Slurred speech. Decreased movement in left arm.  EXAM: MRI HEAD WITHOUT CONTRAST TECHNIQUE: Multiplanar, multiecho pulse sequences of the brain and surrounding structures were obtained without intravenous contrast. COMPARISON:  Head CT 12/07/2015 FINDINGS: Despite efforts by the technologist and patient, motion artifact is present on today's examination and could not be eliminated. This reduces the sensitivity and specificity of the study. There is a small area of diffusion restriction within the right parietal lobe, which is in the region of encephalomalacia associated with prior large right MCA infarct. There is also extensive left frontal encephalomalacia and right occipital encephalomalacia. No midline shift or other significant mass effect. There is ex vacuo dilatation of the ventricles. No extra-axial collection. IMPRESSION: 1. Severely motion degraded examination. 2. Within the above limitation, there is a small area of acute ischemia superimposed on the area of encephalomalacia associated  with the remote right MCA infarct. This is along the right postcentral gyrus. 3. Large areas of encephalomalacia in the right MCA territory and left frontal lobe. Electronically Signed   By: Deatra Robinson M.D.   On: 12/14/2015 05:26   Dg Chest Port 1 View  Result Date: 12/13/2015 CLINICAL DATA:  75 year old male with shortness of breath and confusion EXAM: PORTABLE CHEST 1 VIEW COMPARISON:  Prior chest x-ray 12/07/2015 FINDINGS: Cardiac and mediastinal contours remain unchanged. Atherosclerotic calcifications present in the thoracic aorta. No pulmonary edema, focal airspace consolidation, large pleural effusion or pneumothorax. Hyperinflation and central bronchitic changes are similar compared to prior. No acute osseous abnormality. IMPRESSION: 1. No active cardiopulmonary disease. 2.  Aortic Atherosclerosis (ICD10-170.0) 3. Hyperinflation and central bronchitic changes suggest COPD. Electronically Signed   By: Malachy Moan M.D.   On: 12/13/2015 16:18   Dg Shoulder Left  Result Date: 12/15/2015 CLINICAL DATA:  Swelling and pain to LEFT shoulder, LEFT ankle on LEFT elbow today, no known injury EXAM: LEFT SHOULDER - 2+ VIEW COMPARISON:  None FINDINGS: Osseous demineralization. AC joint alignment normal. No acute fracture, dislocation, or bone destruction. Visualized LEFT ribs intact. IMPRESSION: No acute osseous abnormalities. Electronically Signed   By: Ulyses Southward M.D.   On: 12/15/2015 17:16   Dg Swallowing Func-speech Pathology  Result Date: 12/14/2015 Objective Swallowing Evaluation: Type of Study: MBS-Modified Barium Swallow Study Patient Details Name: Johnaton Sonneborn MRN: [id number] Date of Birth: 10-Nov-1940 Today's Date: 12/14/2015 Time: SLP Start Time (ACUTE ONLY): 1030-SLP Stop Time (ACUTE ONLY): 1045 SLP Time Calculation (min) (ACUTE ONLY): 15 min Past Medical History: Past Medical History: Diagnosis Date . Dementia  . High cholesterol  . Hypertension  . Myocardial infarction  . Stroke  [facility])  Past Surgical History: Past Surgical History: Procedure Laterality Date . CORONARY ANGIOPLASTY   HPI: Jacen Carlini a 75 y.o.malewith medical history significant of dementia, HTn, a fib, HLD, prior right CVA with residual deficits, details not provided. Per family upon admission, patient has been declining in health 2 weeks. Patient normally can ambulate by himself but since Friday he has been unable to do so. He also now has slurred speech as well as decreased movement in his left arm. Found to have a rectal temp of 101.7, he had an increase in his white blood cell count. Chest x-ray was negative for pneumonia, UA was negative for infection,blood cultures have been ordered 2. MRI shows new infarct in right parietal lobe, in same area as prior CVA.  No Data Recorded Assessment / Plan / Recommendation CHL IP CLINICAL IMPRESSIONS 12/14/2015 Therapy Diagnosis Moderate oral phase dysphagia;Moderate pharyngeal phase dysphagia Clinical Impression Pt demosntrates moderate dysphagia, mostly attributable to  cognitive impariment.   Mastication is prolonged, verbal cues needed for transit. Pt orally holds all boluses and struggles to initaite swallow response. Verbal cues and visual cues to give next bolus aid in swallow initiation. Regardless, all boluses reach the pyriform sinuses prior to intiation. WIth upright posture there is no penetration or aspiration observed, no residual. Pt is recommended to consume a dys 2/thin liquid diet with full supervision for verbal cueing and assist. Pt is at risk for aspiration if posture is suboptimal and also at risk for poor nutrition/hydration given lack of initiation. Will follow for swallowing and cognitive therapy.    Impact on safety and function Moderate aspiration risk;Risk for inadequate nutrition/hydration   CHL IP TREATMENT RECOMMENDATION 12/14/2015 Treatment Recommendations Therapy as outlined in treatment plan below   Prognosis 12/14/2015 Prognosis for Safe  Diet Advancement Fair Barriers to Reach Goals Cognitive deficits Barriers/Prognosis Comment -- CHL IP DIET RECOMMENDATION 12/14/2015 SLP Diet Recommendations Dysphagia 2 (Fine chop) solids;Thin liquid Liquid Administration via Cup;Straw Medication Administration Crushed with puree Compensations Minimize environmental distractions;Other (Comment) Postural Changes Seated upright at 90 degrees   CHL IP OTHER RECOMMENDATIONS 12/14/2015 Recommended Consults -- Oral Care Recommendations Oral care BID Other Recommendations Have oral suction available   CHL IP FOLLOW UP RECOMMENDATIONS 12/14/2015 Follow up Recommendations Inpatient Rehab   CHL IP FREQUENCY AND DURATION 12/14/2015 Speech Therapy Frequency (ACUTE ONLY) min 2x/week Treatment Duration 2 weeks      CHL IP ORAL PHASE 12/14/2015 Oral Phase Impaired Oral - Pudding Teaspoon -- Oral - Pudding Cup -- Oral - Honey Teaspoon -- Oral - Honey Cup -- Oral - Nectar Teaspoon -- Oral - Nectar Cup -- Oral - Nectar Straw -- Oral - Thin Teaspoon -- Oral - Thin Cup Holding of bolus;Delayed oral transit Oral - Thin Straw Holding of bolus;Delayed oral transit Oral - Puree Delayed oral transit;Holding of bolus Oral - Mech Soft -- Oral - Regular Holding of bolus;Reduced posterior propulsion;Decreased bolus cohesion;Delayed oral transit Oral - Multi-Consistency -- Oral - Pill Decreased bolus cohesion;Delayed oral transit;Reduced posterior propulsion;Holding of bolus;Other (Comment) Oral Phase - Comment --  CHL IP PHARYNGEAL PHASE 12/14/2015 Pharyngeal Phase Impaired Pharyngeal- Pudding Teaspoon -- Pharyngeal -- Pharyngeal- Pudding Cup -- Pharyngeal -- Pharyngeal- Honey Teaspoon -- Pharyngeal -- Pharyngeal- Honey Cup -- Pharyngeal -- Pharyngeal- Nectar Teaspoon -- Pharyngeal -- Pharyngeal- Nectar Cup -- Pharyngeal -- Pharyngeal- Nectar Straw -- Pharyngeal -- Pharyngeal- Thin Teaspoon -- Pharyngeal -- Pharyngeal- Thin Cup Delayed swallow initiation-pyriform sinuses Pharyngeal --  Pharyngeal- Thin Straw Delayed swallow initiation-pyriform sinuses Pharyngeal -- Pharyngeal- Puree Delayed swallow initiation-pyriform sinuses Pharyngeal -- Pharyngeal- Mechanical Soft -- Pharyngeal -- Pharyngeal- Regular Delayed swallow initiation-pyriform sinuses Pharyngeal -- Pharyngeal- Multi-consistency -- Pharyngeal -- Pharyngeal- Pill -- Pharyngeal -- Pharyngeal Comment --  No flowsheet data found. No flowsheet data found. Harlon Ditty, [place] CCC-SLP [phone] Claudine Mouton 12/14/2015, 11:09 AM                LOS: 3 days   Jeoffrey Massed, MD  [facility] Pager:[phone]  If 7PM-7AM, please contact night-coverage [email or web address] Password [facility] 12/16/2015, 12:56 PM

## 2015-12-16 NOTE — Progress Notes (Signed)
STROKE TEAM PROGRESS NOTE   SUBJECTIVE (INTERVAL HISTORY) No family is at bedside. He still has significant guarding on the left UE, as well as pain at left shoulder, left elbow and b/l ankles. X-ray left shoulder, elbow and ankle no fracture or dislocation. However, left elbow swelling from imaging or clinical observation. On prednisone, will add colchicine for possible gout.    OBJECTIVE Temp:  [98.1 F (36.7 C)-98.7 F (37.1 C)] 98.7 F (37.1 C) (11/23 2302) Pulse Rate:  [84-87] 84 (11/23 2302) Cardiac Rhythm: Normal sinus rhythm (11/23 1927) Resp:  [18-20] 18 (11/23 2302) BP: (110-127)/(62-66) 127/66 (11/23 2302) SpO2:  [97 %-100 %] 99 % (11/23 2302)  CBC:   Recent Labs Lab 12/13/15 1615 12/14/15 0250  WBC 13.1* 11.1*  NEUTROABS 10.3*  --   HGB 12.5* 11.4*  HCT 36.9* 34.3*  MCV 88.7 89.6  PLT 416* 403*    Basic Metabolic Panel:   Recent Labs Lab 12/15/15 0911 12/15/15 1456 12/16/15 0445  NA 136 134* 133*  K 3.2* 3.2* 4.3  CL 104 99* 100*  CO2 24 26 24   GLUCOSE 132* 124* 142*  BUN 8 9 11   CREATININE 0.65 0.76 0.77  CALCIUM 8.5* 8.7* 8.7*  MG 1.8  --   --     Lipid Panel:     Component Value Date/Time   CHOL 100 12/16/2015 0445   TRIG 47 12/16/2015 0445   HDL 36 (L) 12/16/2015 0445   CHOLHDL 2.8 12/16/2015 0445   VLDL 9 12/16/2015 0445   LDLCALC 55 12/16/2015 0445   HgbA1c: No results found for: HGBA1C Urine Drug Screen:     Component Value Date/Time   LABOPIA NONE DETECTED 12/15/2015 1535   COCAINSCRNUR NONE DETECTED 12/15/2015 1535   LABBENZ NONE DETECTED 12/15/2015 1535   AMPHETMU NONE DETECTED 12/15/2015 1535   THCU NONE DETECTED 12/15/2015 1535   LABBARB NONE DETECTED 12/15/2015 1535      IMAGING I have personally reviewed the radiological images below and agree with the radiology interpretations.  Mr Brain Wo Contrast 12/14/2015 1. Severely motion degraded examination. 2. Within the above limitation, there is a small area of acute  ischemia superimposed on the area of encephalomalacia associated with the remote right MCA infarct. This is along the right postcentral gyrus. 3. Large areas of encephalomalacia in the right MCA territory and left frontal lobe.   Dg Chest Port 1 View 12/13/2015 1. No active cardiopulmonary disease. 2.  Aortic Atherosclerosis (ICD10-170.0) 3. Hyperinflation and central bronchitic changes suggest COPD.   TTE 08/2015 - Left ventricle: The cavity size was normal. Systolic function was   normal. The estimated ejection fraction was in the range of 60%   to 65%. Wall motion was normal; there were no regional wall   motion abnormalities. The study is not technically sufficient to   allow evaluation of LV diastolic function. - Aortic valve: Nodular calcificatio nof left and non coronary   cusps - Mitral valve: Calcified annulus. - Right atrium: The atrium was mildly dilated. - Atrial septum: No defect or patent foramen ovale was identified.  CTA head and neck  1. No acute arterial finding. No embolic source or flow limiting stenosis in the right anterior circulation to explain acute infarcts. 2. Moderate left V1 segment stenosis. 3. Advanced chronic ischemic injury which obscures the acute right MCA territory infarct by CT. No hemorrhagic conversion or evolution since brain MRI yesterday. 4. Patchy apical lung opacities that are nonspecific. Some contain central calcifications on the  right, favoring remote infection. Recommend chest x-ray correlation to ensure no acute opacities compared to recent priors. If clinically appropriate, chest CT in 6 months could follow-up the small nodular areas.  Dg Elbow  IMPRESSION: Small joint effusion. No acute osseous abnormalities otherwise identified.   Dg Ankle  12/15/2015 IMPRESSION: No acute osseous abnormalities.   Dg Shoulder  12/15/2015 IMPRESSION: No acute osseous abnormalities.    PHYSICAL EXAM  Temp:  [98.1 F (36.7 C)-98.7 F (37.1  C)] 98.7 F (37.1 C) (11/23 2302) Pulse Rate:  [84-87] 84 (11/23 2302) Resp:  [18-20] 18 (11/23 2302) BP: (110-127)/(62-66) 127/66 (11/23 2302) SpO2:  [97 %-100 %] 99 % (11/23 2302)  General - Well nourished, well developed, pain at left arm with significant guarding.  Ophthalmologic - Fundi not visualized due to noncooperation.  Cardiovascular - irregularly irregular heart rate and rhythm.  Extremities - tenderness on palpation at left shoulder, left elbow, b/l ankles. Left elbow swelling.   Mental Status -  Level of arousal and orientation to time, and person were intact, however, not orientated to time. Language including expression, naming, repetition was assessed and found intact, able to follow simple commands but not 2-step commands. Left neglect.  Cranial Nerves II - XII - II - left hemianopia. III, IV, VI - Extraocular movements intact. V - Facial sensation intact bilaterally. VII - left nasolabial fold flattening. VIII - hard of hearing & vestibular intact bilaterally. X - Palate elevates symmetrically, mild dysarthria. XI - Chin turning & shoulder shrug intact bilaterally. XII - Tongue protrusion intact.  Motor Strength - The patient's strength was 4/5 RUE and 3/5 RLE. However, there is significant guarding of LUE with tenderness on palpation of left shoulder, left elbow with left elbow swollen. LLE 2/5 and tenderness on palpitation at ankle. Bulk was normal and fasciculations were absent.   Motor Tone - Muscle tone was assessed at the neck and appendages and was normal except left UE not able to test due to significant guarding.  Reflexes - The patient's reflexes were 1+ in all extremities except LUE and he had no pathological reflexes.  Sensory - not cooperative.    Coordination - ataxia or dysmetria not cooperative on exam.  Tremor was absent.  Gait and Station - not tested   ASSESSMENT/PLAN [name] is a 75 y.o. male with history of dementia, HTN, a  fib, HLD admitted with declining health x 2 weeks. In hospital, developed slurred speech and LUE hemiparesis.  He did not receive IV t-PA due to out of window.   Left arm and leg pain - ? Gout  Could be the reason for his non ambulatory status for the last two weeks  No fracture or joint dislocation  tenderness on palpation at left shoulder, left elbow, b/l ankles. Left elbow swelling. Consider gout as one possibility - on trial of steroids and colchicine.   Fever at home  CXR negative  UA negative   BCx NGTD  WBC 13.1-11.1  Concerning for gout   DWI restriction signal at right MCA encephalomalacia - signal due to scar tissue/laminar necrosis vs. New ischemia  MRI  Right MCA scattered small DWI restrication at the margin of encephalomalacia  CTA head and neck - unremarkable  2D Echo 08/2015 EF 60-65%. No source of embolus   LDL 55  HgbA1c pending  xarelto for VTE prophylaxis DIET DYS 2 Room service appropriate? Yes; Fluid consistency: Thin  Xarelto (rivaroxaban) daily prior to admission, now on Xarelto (rivaroxaban) daily.  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 known atrial fibrillation  [facility]  Residue left hemianopia and left hemiparesis  But able to walk with assistance at home  On Xarelto for stroke prevention PTA   Atrial Fibrillation  Home anticoagulation:  Xarelto (rivaroxaban) daily continued in the hospital  Continue xarelto at discharge    Hypertension  Stable  Permissive hypertension (OK if < 180/105) but gradually normalize in 5-7 days  Long-term BP goal normotensive  Hyperlipidemia  Home meds:  Pravachol 80, resumed in hospital  LDL 55, goal < 70  Continue statin at discharge  Other Stroke Risk  Factors  Advanced age  Former Cigarette smoker  Family hx stroke (maternal aunt)  Coronary artery disease - MI, CABG  Other Active Problems  [facility]  Hospital day # 3  Marvel PlanJindong Jaclyn Carew, MD PhD Stroke Neurology 12/16/2015 11:07 PM    To contact Stroke Continuity provider, please refer to [email or web address]. After hours, contact General Neurology

## 2015-12-17 ENCOUNTER — Inpatient Hospital Stay (HOSPITAL_COMMUNITY): Payer: Medicare Other

## 2015-12-17 LAB — CBC
HEMATOCRIT: 34.7 % — AB (ref 39.0–52.0)
Hemoglobin: 12 g/dL — ABNORMAL LOW (ref 13.0–17.0)
MCH: 30.2 pg (ref 26.0–34.0)
MCHC: 34.6 g/dL (ref 30.0–36.0)
MCV: 87.2 fL (ref 78.0–100.0)
PLATELETS: 498 10*3/uL — AB (ref 150–400)
RBC: 3.98 MIL/uL — ABNORMAL LOW (ref 4.22–5.81)
RDW: 13 % (ref 11.5–15.5)
WBC: 11.1 10*3/uL — AB (ref 4.0–10.5)

## 2015-12-17 LAB — BASIC METABOLIC PANEL
ANION GAP: 9 (ref 5–15)
BUN: 11 mg/dL (ref 6–20)
CALCIUM: 9 mg/dL (ref 8.9–10.3)
CO2: 27 mmol/L (ref 22–32)
Chloride: 97 mmol/L — ABNORMAL LOW (ref 101–111)
Creatinine, Ser: 0.68 mg/dL (ref 0.61–1.24)
GFR calc Af Amer: >60 mL/min (ref >60)
GLUCOSE: 96 mg/dL (ref 65–99)
Potassium: 3.7 mmol/L (ref 3.5–5.1)
Sodium: 133 mmol/L — ABNORMAL LOW (ref 135–145)

## 2015-12-17 LAB — HEMOGLOBIN A1C
Hgb A1c MFr Bld: 6.2 % — ABNORMAL HIGH (ref 4.8–5.6)
Mean Plasma Glucose: 131 mg/dL

## 2015-12-17 NOTE — Progress Notes (Addendum)
PROGRESS NOTE        PATIENT DETAILS Name: Calvin Diaz Age: 75 y.o. Sex: male Date of Birth: 26-Dec-1940 Admit Date: 12/13/2015 Admitting Physician Joseph ArtJessica U Vann, DO WGN:FAOZHYQ,MVHQPCP:LALONDE,JOHN CHARLES, MD  Brief Narrative: Patient is a 75 y.o. male recent CVA, atrial fibrillation on anticoagulation who presented to the ED with 2-3 week history of waxing and waning confusion, intermittent slurred speech and inability to ambulate due to pain in his left upper and lower extremity. See below for further details.  Subjective: Mildly confused this morning-his left elbow pain and swelling seems to be unchanged-on further exam he appears to have some swelling in the proximal aspect of his forearm right around his elbow as well. He is now able to move his left lower extremity is more freely, but continues to have pain and restricted mobility of his left upper extremity.   Assessment/Plan: Fever with Polyarticular arthritis: Had one episode of fever on admission-none since then. He was seen by orthopedics, who thought patient did not have enough effusion for an arthrocentesis, recommendations were to start anti-inflammatory use. This has been started. He was subsequently started on steroids on 11/22, and colchicine on 11/23-his left elbow pain and swelling is essentially unchanged, he has had improvement in his left ankle swelling. His left upper extremity mobility is still restricted due to pain. I am actually not sure whether he has developed some amount of contractures-as this was ongoing for at least 3 weeks prior to this hospitalization. I'm also not sure as to what his baseline mobility of his left upper extremity was-he did have a stroke with left-sided deficits in July of this year. Uric acid levels are not elevated, blood cultures continue to be negative. Plans  are to continue with current anti-inflammatories, and will pursue further imaging of his left elbow area. Continue to  monitor off antimicrobial agents as he is afebrile.   Acute CVA: Poor historian-he does have some amount of dementia at baseline-not sure if he is unable to move his left side due to pain from polyarticular arthritis or he has had another small CVA. MRI brain shows a possible small acute CVA in the right parietal lobe-stroke team following-continue Xarelto for now. Await further recommendations from stroke team.  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   Hypertension: Controlled, continue amlodipine and Terazosin  Hypokalemia: Replete and recheck  Dementia with mild delirium: Continue Namenda.  Chronic insomnia: Resume belsomra as outpatient  Patchy apical lung opacities: Seen on CT angiogram of the neck, no obvious infiltrates seen on chest x-ray. Radiology recommends to repeat CT in 6 months.   DVT Prophylaxis: Full dose anticoagulation with Xarelto  Code Status: DNR  Family Communication: None at bedside  Disposition Plan: Remain inpatient- SNF on discharge-likely over the weekend  Antimicrobial agents: See below  Procedures: None  CONSULTS:  neurology  Time spent: 25 minutes-Greater than 50% of this time was spent in counseling, explanation of diagnosis, planning of further management, and coordination of care.  MEDICATIONS: Anti-infectives    Start     Dose/Rate Route Frequency Ordered Stop  12/14/15 0600  vancomycin (VANCOCIN) 500 mg in sodium chloride 0.9 % 100 mL IVPB  Status:  Discontinued     500 mg 100 mL/hr over 60 Minutes Intravenous Every 12 hours 12/13/15 1752 12/13/15 2049   12/13/15 2300   piperacillin-tazobactam (ZOSYN) IVPB 3.375 g  Status:  Discontinued     3.375 g 12.5 mL/hr over 240 Minutes Intravenous Every 8 hours 12/13/15 1752 12/13/15 2049   12/13/15 1700  vancomycin (VANCOCIN) IVPB 1000 mg/200 mL premix     1,000 mg 200 mL/hr over 60 Minutes Intravenous  Once 12/13/15 1653 12/13/15 2006   12/13/15 1700  piperacillin-tazobactam (ZOSYN) IVPB 3.375 g     3.375 g 100 mL/hr over 30 Minutes Intravenous  Once 12/13/15 1653 12/13/15 1747      Scheduled Meds: . feeding supplement (ENSURE ENLIVE)  237 mL Oral BID BM  . memantine  10 mg Oral Daily  . multivitamin with minerals  1 tablet Oral Daily  . pravastatin  80 mg Oral QHS  . predniSONE  40 mg Oral Q breakfast  . rivaroxaban  20 mg Oral Q supper  . sodium chloride flush  3 mL Intravenous Q12H  . terazosin  1 mg Oral QHS   Continuous Infusions: PRN Meds:.acetaminophen **OR** acetaminophen   PHYSICAL EXAM: Vital signs: Vitals:   12/16/15 0455 12/16/15 1443 12/16/15 2302 12/17/15 0620  BP: 110/63 118/62 127/66 115/63  Pulse: 87 87 84 80  Resp: 20 20 18 20   Temp: 98.4 F (36.9 C) 98.1 F (36.7 C) 98.7 F (37.1 C) 97.5 F (36.4 C)  TempSrc: Oral Oral Oral Oral  SpO2: 100% 97% 99% 98%  Weight:      Height:       Filed Weights   12/13/15 2151 12/15/15 0553  Weight: 56.5 kg (124 lb 9 oz) 59.6 kg (131 lb 6.3 oz)   Body mass index is 18.85 kg/m.   General appearance :AwakeAnd mostly alert. Answers most of my questions appropriately. Eyes:, pupils equally reactive to light and accomodation,no scleral icterus.Pink conjunctiva HEENT: Atraumatic and Normocephalic Neck: supple, no JVD. No cervical lymphadenopathy. No thyromegaly Resp:Good air entry bilaterally, no added sounds  CVS: S1 S2 irregular.  GI: Bowel sounds present, Non tender and not distended with no gaurding, rigidity or rebound.No organomegaly Extremities: B/L Lower Ext shows no edema, both legs are warm to touch Neurology: Hard to discern   left upper weakness/strength due to pain from swollen left elbow-but after multiple attempts-seems able to lift left upper extremity against-his left lower extremity is significantly improved, and is close to around 4/5 in strength.  5/5 in right upper and right lower extremity. Musculoskeletal:No digital cyanosis Skin:No Rash, warm and dry Wounds:N/A  I have personally reviewed following labs and imaging studies  LABORATORY DATA: CBC:  Recent Labs Lab 12/13/15 1615 12/14/15 0250 12/17/15 0629  WBC 13.1* 11.1* 11.1*  NEUTROABS 10.3*  --   --   HGB 12.5* 11.4* 12.0*  HCT 36.9* 34.3* 34.7*  MCV 88.7 89.6 87.2  PLT 416* 403* 498*    Basic Metabolic Panel:  Recent Labs Lab 12/14/15 0250 12/15/15 0911 12/15/15 1456 12/16/15 0445 12/17/15 0629  NA 141 136 134* 133* 133*  K 3.1* 3.2* 3.2* 4.3 3.7  CL 105 104 99* 100* 97*  CO2 28 24 26 24 27   GLUCOSE 114* 132* 124* 142* 96  BUN 14 8 9 11 11   CREATININE 0.87 0.65 0.76 0.77 0.68  CALCIUM 8.8* 8.5* 8.7* 8.7* 9.0  MG  --  1.8  --   --   --     GFR: Estimated Creatinine Clearance: 68.3 mL/min (by C-G formula based on SCr of 0.68 mg/dL).  Liver Function Tests:  Recent Labs Lab 12/13/15 1615  AST 34  ALT 24  ALKPHOS 65  BILITOT 0.4  PROT 8.1  ALBUMIN 2.8*    Recent Labs Lab 12/13/15 1615  LIPASE 20   No results for input(s): AMMONIA in the last 168 hours.  Coagulation Profile: No results for input(s): INR, PROTIME in the last 168 hours.  Cardiac Enzymes:  Recent Labs Lab 12/13/15 2102 12/14/15 0250  TROPONINI 0.03* 0.03*    BNP (last 3 results) No results for input(s): PROBNP in the last 8760 hours.  HbA1C:  Recent Labs  12/16/15 0445  HGBA1C 6.2*    CBG: No results for input(s): GLUCAP in the last 168 hours.  Lipid Profile:  Recent Labs  12/16/15 0445  CHOL 100  HDL 36*  LDLCALC 55  TRIG 47  CHOLHDL 2.8    Thyroid Function Tests: No results for input(s): TSH, T4TOTAL, FREET4,  T3FREE, THYROIDAB in the last 72 hours.  Anemia Panel: No results for input(s): VITAMINB12, FOLATE, FERRITIN, TIBC, IRON, RETICCTPCT in the last 72 hours.  Urine analysis:    Component Value Date/Time   COLORURINE AMBER (A) 12/13/2015 1602   APPEARANCEUR CLEAR 12/13/2015 1602   LABSPEC 1.023 12/13/2015 1602   PHURINE 6.0 12/13/2015 1602   GLUCOSEU NEGATIVE 12/13/2015 1602   HGBUR NEGATIVE 12/13/2015 1602   BILIRUBINUR SMALL (A) 12/13/2015 1602   KETONESUR NEGATIVE 12/13/2015 1602   PROTEINUR 30 (A) 12/13/2015 1602   NITRITE NEGATIVE 12/13/2015 1602   LEUKOCYTESUR NEGATIVE 12/13/2015 1602    Sepsis Labs: Lactic Acid, Venous    Component Value Date/Time   LATICACIDVEN 0.73 12/13/2015 1931    MICROBIOLOGY: Recent Results (from the past 240 hour(s))  Culture, blood (Routine X 2) w Reflex to ID Panel     Status: None (Preliminary result)   Collection Time: 12/13/15  5:07 PM  Result Value Ref Range Status   Specimen Description BLOOD LEFT FOREARM  Final   Special Requests BOTTLES DRAWN AEROBIC AND ANAEROBIC 5CC  Final   Culture NO GROWTH 3 DAYS  Final   Report Status PENDING  Incomplete  Culture, blood (Routine X 2) w Reflex to ID Panel     Status: None (Preliminary result)   Collection Time: 12/13/15  5:07 PM  Result Value Ref Range Status   Specimen Description BLOOD RIGHT FOREARM  Final   Special Requests BOTTLES DRAWN AEROBIC AND ANAEROBIC 5CC  Final   Culture NO GROWTH 3 DAYS  Final   Report Status PENDING  Incomplete    RADIOLOGY STUDIES/RESULTS: Ct Angio Head W Or Wo Contrast  Result Date: 12/15/2015 CLINICAL DATA:  History of dementia. Slurred speech and decreased movement in the left arm. EXAM: CT ANGIOGRAPHY HEAD AND NECK TECHNIQUE: Multidetector CT imaging of the head and neck was performed using the standard protocol during bolus administration of intravenous contrast. Multiplanar CT image reconstructions and MIPs were obtained to evaluate the vascular anatomy.  Carotid stenosis measurements (when applicable) are obtained utilizing NASCET criteria, using the distal internal carotid diameter as the denominator. CONTRAST:  50 cc Isovue 370 intravenous COMPARISON:  Brain MRI from yesterday FINDINGS: CT HEAD FINDINGS Brain: Acute on chronic ischemia in the right MCA territory is poorly seen by CT. No acute hemorrhage. Remote left MCA territory artifact involving large portion of the lateral left  frontal lobe atrophy with ventriculomegaly. Vascular: Extensive atherosclerotic calcification. Skull: Negative Sinuses: Negative Orbits: Negative Review of the MIP images confirms the above findings CTA NECK FINDINGS Aortic arch: Extensive atherosclerotic plaque. No aneurysm or dissection. Right carotid system: Atherosclerotic plaque predominately at the carotid bifurcation and in the proximal ICA with no flow limiting stenosis of 50% or greater. No ulceration or dissection. Second mixed density plaque in the distal ICA at the skullbase without stenosis. No vessel beading. Left carotid system: Mild ostial narrowing due to mixed density plaque. Predominantly calcified plaque at the carotid bifurcation without ICA stenosis. Prominent calcified plaque in the ICA at the skullbase without flow limiting stenosis. No ulceration or dissection. Vertebral arteries: No proximal flow limiting stenosis in the subclavian arteries. Moderate proximal left vertebral artery stenosis. Mild right ostial stenosis at calcified plaque. No evidence of dissection. Skeleton: No acute or aggressive finding. Advanced cervical disc degeneration. Other neck: No incidental mass or adenopathy. Upper chest: There are spiculated reticular nodular densities at the apices, some containing central calcification at the right apex. This calcification favors postinfectious scarring. Need correlation for acute infectious symptoms the malignancy history. Review of the MIP images confirms the above findings CTA HEAD FINDINGS  Anterior circulation: Extensive calcified plaque on the carotid siphons. Assessment of luminal narrowing is limited by blooming calcium, no flow limiting stenosis suspected. No reversible flow limiting stenosis in the proximal circulation. Negative for aneurysm. Complete circle-of-Willis. Posterior circulation: Symmetric vertebral arteries. Smooth and widely patent vertebral and basilar arteries. No major branch occlusion or flow limiting stenosis. Venous sinuses: Patent as permitted by contrast timing Anatomic variants: None, Delayed phase: No parenchymal enhancement or mass Review of the MIP images confirms the above findings IMPRESSION: 1. No acute arterial finding. No embolic source or flow limiting stenosis in the right anterior circulation to explain acute infarcts. 2. Moderate left V1 segment stenosis. 3. Advanced chronic ischemic injury which obscures the acute right MCA territory infarct by CT. No hemorrhagic conversion or evolution since brain MRI yesterday. 4. Patchy apical lung opacities that are nonspecific. Some contain central calcifications on the right, favoring remote infection. Recommend chest x-ray correlation to ensure no acute opacities compared to recent priors. If clinically appropriate, chest CT in 6 months could follow-up the small nodular areas. Electronically Signed   By: Marnee SpringJonathon  Watts M.D.   On: 12/15/2015 17:00   Dg Chest 2 View  Result Date: 12/07/2015 CLINICAL DATA:  Cough for 2 weeks, hypertension. EXAM: CHEST  2 VIEW COMPARISON:  09/06/2015 FINDINGS: There is hyperinflation of the lungs compatible with COPD. Heart and mediastinal contours are within normal limits. No focal opacities or effusions. No acute bony abnormality. IMPRESSION: COPD.  No active disease. Electronically Signed   By: Charlett NoseKevin  Dover M.D.   On: 12/07/2015 16:06   Dg Elbow 2 Views Left  Result Date: 12/15/2015 CLINICAL DATA:  Swelling and pain to LEFT shoulder, LEFT ankle on LEFT elbow today, no known  injury EXAM: LEFT ELBOW - 2 VIEW COMPARISON:  None FINDINGS: Mild osseous demineralization. Joint spaces preserved. Small elbow joint effusion. No acute fracture, dislocation, or bone destruction. Question soft tissue swelling and elbow region. IMPRESSION: Small joint effusion. No acute osseous abnormalities otherwise identified. Electronically Signed   By: Ulyses SouthwardMark  Boles M.D.   On: 12/15/2015 17:15   Dg Ankle 2 Views Left  Result Date: 12/15/2015 CLINICAL DATA:  Swelling and pain to LEFT shoulder, LEFT ankle on LEFT elbow today, no known injury EXAM: LEFT ANKLE - 2  VIEW COMPARISON:  None FINDINGS: Osseous demineralization. Joint spaces preserved. Mild soft tissue swelling greatest laterally. Superimposed artifacts at calcaneus on lateral view. No acute fracture, dislocation, or bone destruction. IMPRESSION: No acute osseous abnormalities. Electronically Signed   By: Ulyses Southward M.D.   On: 12/15/2015 17:15   Ct Head Wo Contrast  Result Date: 12/07/2015 CLINICAL DATA:  LEFT side pain weakness for 3 months, history of stroke on 06/23/2015, dementia, hypertension, former smoker EXAM: CT HEAD WITHOUT CONTRAST TECHNIQUE: Contiguous axial images were obtained from the base of the skull through the vertex without intravenous contrast. COMPARISON:  Non FINDINGS: Brain: Generalized atrophy. Mild ex vacuo dilatation of the ventricular system. No midline shift or mass effect. LEFT frontal infarct. Larger RIGHT frontal and parietal infarcts. BILATERAL frontal infarcts appear old. RIGHT parietal infarct is likely subacute to old. No intracranial hemorrhage, mass lesion or extra-axial fluid collection. Vascular: Scattered atherosclerotic calcifications at the carotid siphons bilaterally Skull: Intact Sinuses/Orbits: Clear Other: N/A IMPRESSION: Atrophy with small vessel chronic ischemic changes of deep cerebral white matter. LEFT frontal and RIGHT frontal/parietal infarcts as above without intracranial hemorrhage.  Electronically Signed   By: Ulyses Southward M.D.   On: 12/07/2015 14:22   Ct Angio Neck W Or Wo Contrast  Result Date: 12/15/2015 CLINICAL DATA:  History of dementia. Slurred speech and decreased movement in the left arm. EXAM: CT ANGIOGRAPHY HEAD AND NECK TECHNIQUE: Multidetector CT imaging of the head and neck was performed using the standard protocol during bolus administration of intravenous contrast. Multiplanar CT image reconstructions and MIPs were obtained to evaluate the vascular anatomy. Carotid stenosis measurements (when applicable) are obtained utilizing NASCET criteria, using the distal internal carotid diameter as the denominator. CONTRAST:  50 cc Isovue 370 intravenous COMPARISON:  Brain MRI from yesterday FINDINGS: CT HEAD FINDINGS Brain: Acute on chronic ischemia in the right MCA territory is poorly seen by CT. No acute hemorrhage. Remote left MCA territory artifact involving large portion of the lateral left frontal lobe atrophy with ventriculomegaly. Vascular: Extensive atherosclerotic calcification. Skull: Negative Sinuses: Negative Orbits: Negative Review of the MIP images confirms the above findings CTA NECK FINDINGS Aortic arch: Extensive atherosclerotic plaque. No aneurysm or dissection. Right carotid system: Atherosclerotic plaque predominately at the carotid bifurcation and in the proximal ICA with no flow limiting stenosis of 50% or greater. No ulceration or dissection. Second mixed density plaque in the distal ICA at the skullbase without stenosis. No vessel beading. Left carotid system: Mild ostial narrowing due to mixed density plaque. Predominantly calcified plaque at the carotid bifurcation without ICA stenosis. Prominent calcified plaque in the ICA at the skullbase without flow limiting stenosis. No ulceration or dissection. Vertebral arteries: No proximal flow limiting stenosis in the subclavian arteries. Moderate proximal left vertebral artery stenosis. Mild right ostial stenosis  at calcified plaque. No evidence of dissection. Skeleton: No acute or aggressive finding. Advanced cervical disc degeneration. Other neck: No incidental mass or adenopathy. Upper chest: There are spiculated reticular nodular densities at the apices, some containing central calcification at the right apex. This calcification favors postinfectious scarring. Need correlation for acute infectious symptoms the malignancy history. Review of the MIP images confirms the above findings CTA HEAD FINDINGS Anterior circulation: Extensive calcified plaque on the carotid siphons. Assessment of luminal narrowing is limited by blooming calcium, no flow limiting stenosis suspected. No reversible flow limiting stenosis in the proximal circulation. Negative for aneurysm. Complete circle-of-Willis. Posterior circulation: Symmetric vertebral arteries. Smooth and widely patent vertebral and basilar arteries.  No major branch occlusion or flow limiting stenosis. Venous sinuses: Patent as permitted by contrast timing Anatomic variants: None, Delayed phase: No parenchymal enhancement or mass Review of the MIP images confirms the above findings IMPRESSION: 1. No acute arterial finding. No embolic source or flow limiting stenosis in the right anterior circulation to explain acute infarcts. 2. Moderate left V1 segment stenosis. 3. Advanced chronic ischemic injury which obscures the acute right MCA territory infarct by CT. No hemorrhagic conversion or evolution since brain MRI yesterday. 4. Patchy apical lung opacities that are nonspecific. Some contain central calcifications on the right, favoring remote infection. Recommend chest x-ray correlation to ensure no acute opacities compared to recent priors. If clinically appropriate, chest CT in 6 months could follow-up the small nodular areas. Electronically Signed   By: Marnee Spring M.D.   On: 12/15/2015 17:00   Mr Brain Wo Contrast  Result Date: 12/14/2015 CLINICAL DATA:  Declining  health for 2 weeks. Difficulty ambulating. Slurred speech. Decreased movement in left arm. EXAM: MRI HEAD WITHOUT CONTRAST TECHNIQUE: Multiplanar, multiecho pulse sequences of the brain and surrounding structures were obtained without intravenous contrast. COMPARISON:  Head CT 12/07/2015 FINDINGS: Despite efforts by the technologist and patient, motion artifact is present on today's examination and could not be eliminated. This reduces the sensitivity and specificity of the study. There is a small area of diffusion restriction within the right parietal lobe, which is in the region of encephalomalacia associated with prior large right MCA infarct. There is also extensive left frontal encephalomalacia and right occipital encephalomalacia. No midline shift or other significant mass effect. There is ex vacuo dilatation of the ventricles. No extra-axial collection. IMPRESSION: 1. Severely motion degraded examination. 2. Within the above limitation, there is a small area of acute ischemia superimposed on the area of encephalomalacia associated with the remote right MCA infarct. This is along the right postcentral gyrus. 3. Large areas of encephalomalacia in the right MCA territory and left frontal lobe. Electronically Signed   By: Deatra Robinson M.D.   On: 12/14/2015 05:26   Dg Chest Port 1 View  Result Date: 12/13/2015 CLINICAL DATA:  75 year old male with shortness of breath and confusion EXAM: PORTABLE CHEST 1 VIEW COMPARISON:  Prior chest x-ray 12/07/2015 FINDINGS: Cardiac and mediastinal contours remain unchanged. Atherosclerotic calcifications present in the thoracic aorta. No pulmonary edema, focal airspace consolidation, large pleural effusion or pneumothorax. Hyperinflation and central bronchitic changes are similar compared to prior. No acute osseous abnormality. IMPRESSION: 1. No active cardiopulmonary disease. 2.  Aortic Atherosclerosis (ICD10-170.0) 3. Hyperinflation and central bronchitic changes  suggest COPD. Electronically Signed   By: Malachy Moan M.D.   On: 12/13/2015 16:18   Dg Shoulder Left  Result Date: 12/15/2015 CLINICAL DATA:  Swelling and pain to LEFT shoulder, LEFT ankle on LEFT elbow today, no known injury EXAM: LEFT SHOULDER - 2+ VIEW COMPARISON:  None FINDINGS: Osseous demineralization. AC joint alignment normal. No acute fracture, dislocation, or bone destruction. Visualized LEFT ribs intact. IMPRESSION: No acute osseous abnormalities. Electronically Signed   By: Ulyses Southward M.D.   On: 12/15/2015 17:16   Dg Swallowing Func-speech Pathology  Result Date: 12/14/2015 Objective Swallowing Evaluation: Type of Study: MBS-Modified Barium Swallow Study Patient Details Name: Calvin Diaz MRN: [id number] Date of Birth: 1941-01-04 Today's Date: 12/14/2015 Time: SLP Start Time (ACUTE ONLY): 1030-SLP Stop Time (ACUTE ONLY): 1045 SLP Time Calculation (min) (ACUTE ONLY): 15 min Past Medical History: Past Medical History: Diagnosis Date . Dementia  . High cholesterol  .  Hypertension  . Myocardial infarction  . Stroke [facility])  Past Surgical History: Past Surgical History: Procedure Laterality Date . CORONARY ANGIOPLASTY   HPI: Calvin Diaz a 75 y.o.malewith medical history significant of dementia, HTn, a fib, HLD, prior right CVA with residual deficits, details not provided. Per family upon admission, patient has been declining in health 2 weeks. Patient normally can ambulate by himself but since Friday he has been unable to do so. He also now has slurred speech as well as decreased movement in his left arm. Found to have a rectal temp of 101.7, he had an increase in his white blood cell count. Chest x-ray was negative for pneumonia, UA was negative for infection,blood cultures have been ordered 2. MRI shows new infarct in right parietal lobe, in same area as prior CVA.  No Data Recorded Assessment / Plan / Recommendation CHL IP CLINICAL IMPRESSIONS 12/14/2015 Therapy Diagnosis Moderate  oral phase dysphagia;Moderate pharyngeal phase dysphagia Clinical Impression Pt demosntrates moderate dysphagia, mostly attributable to cognitive impariment.   Mastication is prolonged, verbal cues needed for transit. Pt orally holds all boluses and struggles to initaite swallow response. Verbal cues and visual cues to give next bolus aid in swallow initiation. Regardless, all boluses reach the pyriform sinuses prior to intiation. WIth upright posture there is no penetration or aspiration observed, no residual. Pt is recommended to consume a dys 2/thin liquid diet with full supervision for verbal cueing and assist. Pt is at risk for aspiration if posture is suboptimal and also at risk for poor nutrition/hydration given lack of initiation. Will follow for swallowing and cognitive therapy.    Impact on safety and function Moderate aspiration risk;Risk for inadequate nutrition/hydration   [facility] IP TREATMENT RECOMMENDATION 12/14/2015 Treatment Recommendations Therapy as outlined in treatment plan below   Prognosis 12/14/2015 Prognosis for Safe Diet Advancement Fair Barriers to Reach Goals Cognitive deficits Barriers/Prognosis Comment -- [facility] IP DIET RECOMMENDATION 12/14/2015 SLP Diet Recommendations Dysphagia 2 (Fine chop) solids;Thin liquid Liquid Administration via Cup;Straw Medication Administration Crushed with puree Compensations Minimize environmental distractions;Other (Comment) Postural Changes Seated upright at 90 degrees   [facility] IP OTHER RECOMMENDATIONS 12/14/2015 Recommended Consults -- Oral Care Recommendations Oral care BID Other Recommendations Have oral suction available   [facility] IP FOLLOW UP RECOMMENDATIONS 12/14/2015 Follow up Recommendations Inpatient Rehab   [facility] IP FREQUENCY AND DURATION 12/14/2015 Speech Therapy Frequency (ACUTE ONLY) min 2x/week Treatment Duration 2 weeks      [facility] IP ORAL PHASE 12/14/2015 Oral Phase Impaired Oral - Pudding Teaspoon -- Oral - Pudding Cup -- Oral - Honey Teaspoon -- Oral -  Honey Cup -- Oral - Nectar Teaspoon -- Oral - Nectar Cup -- Oral - Nectar Straw -- Oral - Thin Teaspoon -- Oral - Thin Cup Holding of bolus;Delayed oral transit Oral - Thin Straw Holding of bolus;Delayed oral transit Oral - Puree Delayed oral transit;Holding of bolus Oral - Mech Soft -- Oral - Regular Holding of bolus;Reduced posterior propulsion;Decreased bolus cohesion;Delayed oral transit Oral - Multi-Consistency -- Oral - Pill Decreased bolus cohesion;Delayed oral transit;Reduced posterior propulsion;Holding of bolus;Other (Comment) Oral Phase - Comment --  [facility] IP PHARYNGEAL PHASE 12/14/2015 Pharyngeal Phase Impaired Pharyngeal- Pudding Teaspoon -- Pharyngeal -- Pharyngeal- Pudding Cup -- Pharyngeal -- Pharyngeal- Honey Teaspoon -- Pharyngeal -- Pharyngeal- Honey Cup -- Pharyngeal -- Pharyngeal- Nectar Teaspoon -- Pharyngeal -- Pharyngeal- Nectar Cup -- Pharyngeal -- Pharyngeal- Nectar Straw -- Pharyngeal -- Pharyngeal- Thin Teaspoon -- Pharyngeal -- Pharyngeal- Thin Cup Delayed swallow initiation-pyriform sinuses Pharyngeal -- Pharyngeal-  Thin Straw Delayed swallow initiation-pyriform sinuses Pharyngeal -- Pharyngeal- Puree Delayed swallow initiation-pyriform sinuses Pharyngeal -- Pharyngeal- Mechanical Soft -- Pharyngeal -- Pharyngeal- Regular Delayed swallow initiation-pyriform sinuses Pharyngeal -- Pharyngeal- Multi-consistency -- Pharyngeal -- Pharyngeal- Pill -- Pharyngeal -- Pharyngeal Comment --  No flowsheet data found. No flowsheet data found. Harlon Ditty, [place] CCC-SLP [phone] Claudine Mouton 12/14/2015, 11:09 AM                LOS: 4 days   Jeoffrey Massed, MD  [facility] Pager:[phone]  If 7PM-7AM, please contact night-coverage [email or web address] Password [facility] 12/17/2015, 12:58 PM

## 2015-12-17 NOTE — Clinical Social Work Note (Signed)
CSW spoke with pt's wife to provide bed offers. Pt's wife choice [facility], CSW updated the facility. Per MD, pt may be ready for discharge over the weekend.   Dede QuerySarah Lamontae Ricardo, MSW, LCSW  Clinical Social Worker  [phone]

## 2015-12-17 NOTE — Progress Notes (Signed)
PT Cancellation Note  Patient Details Name: Calvin HawkingJames Boudoin MRN: [id number] DOB: [birth year]   Cancelled Treatment:    Reason Eval/Treat Not Completed: Medical issues which prohibited therapy (chest pain and will check later).  Nursing screening first and then will check on him.   Ivar DrapeStout, Emmamarie Kluender E 12/17/2015, 11:07 AM    Samul Dadauth Kashlyn Salinas, PT MS Acute Rehab Dept. Number: [facility] [phone] and [facility] [phone]

## 2015-12-17 NOTE — Progress Notes (Signed)
   12/17/15 1400  Clinical Encounter Type  Visited With Patient and family together  Visit Type Follow-up  Spiritual Encounters  Spiritual Needs Emotional  Stress Factors  Patient Stress Factors Health changes  Family Stress Factors Family relationships  Introduction to family. Informed family that notary and volunteers unavailable today. Follow up Monday unless Pt discharged to nursing home.

## 2015-12-17 NOTE — Progress Notes (Signed)
STROKE TEAM PROGRESS NOTE   SUBJECTIVE (INTERVAL HISTORY) No family is at bedside. He still has significant guarding on the left UE, as well as pain at left shoulder, left elbow and b/l ankles. No response to colchicine and steroids. Still has swelling at left elbow. Will do MRI of left elbow.    OBJECTIVE Temp:  [97.5 F (36.4 C)-98.7 F (37.1 C)] 98.3 F (36.8 C) (11/24 1447) Pulse Rate:  [80-85] 85 (11/24 1447) Cardiac Rhythm: Normal sinus rhythm (11/24 0700) Resp:  [17-20] 17 (11/24 1447) BP: (115-127)/(60-66) 122/60 (11/24 1447) SpO2:  [98 %-99 %] 98 % (11/24 1447)  CBC:   Recent Labs Lab 12/13/15 1615 12/14/15 0250 12/17/15 0629  WBC 13.1* 11.1* 11.1*  NEUTROABS 10.3*  --   --   HGB 12.5* 11.4* 12.0*  HCT 36.9* 34.3* 34.7*  MCV 88.7 89.6 87.2  PLT 416* 403* 498*    Basic Metabolic Panel:   Recent Labs Lab 12/15/15 0911  12/16/15 0445 12/17/15 0629  NA 136  < > 133* 133*  K 3.2*  < > 4.3 3.7  CL 104  < > 100* 97*  CO2 24  < > 24 27  GLUCOSE 132*  < > 142* 96  BUN 8  < > 11 11  CREATININE 0.65  < > 0.77 0.68  CALCIUM 8.5*  < > 8.7* 9.0  MG 1.8  --   --   --   < > = values in this interval not displayed.  Lipid Panel:     Component Value Date/Time   CHOL 100 12/16/2015 0445   TRIG 47 12/16/2015 0445   HDL 36 (L) 12/16/2015 0445   CHOLHDL 2.8 12/16/2015 0445   VLDL 9 12/16/2015 0445   LDLCALC 55 12/16/2015 0445   HgbA1c:  Lab Results  Component Value Date   HGBA1C 6.2 (H) 12/16/2015   Urine Drug Screen:     Component Value Date/Time   LABOPIA NONE DETECTED 12/15/2015 1535   COCAINSCRNUR NONE DETECTED 12/15/2015 1535   LABBENZ NONE DETECTED 12/15/2015 1535   AMPHETMU NONE DETECTED 12/15/2015 1535   THCU NONE DETECTED 12/15/2015 1535   LABBARB NONE DETECTED 12/15/2015 1535      IMAGING I have personally reviewed the radiological images below and agree with the radiology interpretations.  Mr Brain Wo Contrast 12/14/2015 1. Severely  motion degraded examination.  2. Within the above limitation, there is a small area of acute ischemia superimposed on the area of encephalomalacia associated with the remote right MCA infarct. This is along the right postcentral gyrus.  3. Large areas of encephalomalacia in the right MCA territory and left frontal lobe.   Dg Chest Port 1 View 12/13/2015 1. No active cardiopulmonary disease.  2. Aortic Atherosclerosis (ICD10-170.0)  3. Hyperinflation and central bronchitic changes suggest COPD.   TTE 08/2015 - Left ventricle: The cavity size was normal. Systolic function was   normal. The estimated ejection fraction was in the range of 60%   to 65%. Wall motion was normal; there were no regional wall   motion abnormalities. The study is not technically sufficient to   allow evaluation of LV diastolic function. - Aortic valve: Nodular calcificatio nof left and non coronary   cusps - Mitral valve: Calcified annulus. - Right atrium: The atrium was mildly dilated. - Atrial septum: No defect or patent foramen ovale was identified.  CTA head and neck  12/15/2015 1. No acute arterial finding. No embolic source or flow limiting stenosis in the  right anterior circulation to explain acute infarcts. 2. Moderate left V1 segment stenosis. 3. Advanced chronic ischemic injury which obscures the acute right MCA territory infarct by CT. No hemorrhagic conversion or evolution since brain MRI yesterday. 4. Patchy apical lung opacities that are nonspecific. Some contain central calcifications on the right, favoring remote infection. Recommend chest x-ray correlation to ensure no acute opacities compared to recent priors. If clinically appropriate, chest CT in 6 months could follow-up the small nodular areas.  Dg Elbow  IMPRESSION:  Small joint effusion.  No acute osseous abnormalities otherwise identified.   Dg Ankle  12/15/2015 IMPRESSION:  No acute osseous abnormalities.   Dg Shoulder   12/15/2015 IMPRESSION:  No acute osseous abnormalities.   MRI left elbow pending   PHYSICAL EXAM  Temp:  [97.5 F (36.4 C)-98.7 F (37.1 C)] 98.3 F (36.8 C) (11/24 1447) Pulse Rate:  [80-85] 85 (11/24 1447) Resp:  [17-20] 17 (11/24 1447) BP: (115-127)/(60-66) 122/60 (11/24 1447) SpO2:  [98 %-99 %] 98 % (11/24 1447)  General - Well nourished, well developed, pain at left arm with significant guarding.  Ophthalmologic - Fundi not visualized due to noncooperation.  Cardiovascular - irregularly irregular heart rate and rhythm.  Extremities - tenderness on palpation at left shoulder, left elbow, b/l ankles. Left elbow swelling.   Mental Status -  Level of arousal and orientation to time, and person were intact, however, not orientated to time. Language including expression, naming, repetition was assessed and found intact, able to follow simple commands but not 2-step commands. Left neglect.  Cranial Nerves II - XII - II - left hemianopia. III, IV, VI - Extraocular movements intact. V - Facial sensation intact bilaterally. VII - left nasolabial fold flattening. VIII - hard of hearing & vestibular intact bilaterally. X - Palate elevates symmetrically, mild dysarthria. XI - Chin turning & shoulder shrug intact bilaterally. XII - Tongue protrusion intact.  Motor Strength - The patient's strength was 4/5 RUE and 3/5 RLE. However, there is significant guarding of LUE with tenderness on palpation of left shoulder, left elbow with left elbow swollen. LLE 2/5 and tenderness on palpitation at ankle. Bulk was normal and fasciculations were absent.   Motor Tone - Muscle tone was assessed at the neck and appendages and was normal except left UE not able to test due to significant guarding.  Reflexes - The patient's reflexes were 1+ in all extremities except LUE and he had no pathological reflexes.  Sensory - not cooperative.    Coordination - ataxia or dysmetria not cooperative on  exam.  Tremor was absent.  Gait and Station - not tested   ASSESSMENT/PLAN [name] is a 75 y.o. male with history of dementia, HTN, a fib, HLD admitted with declining health x 2 weeks. In hospital, developed slurred speech and LUE hemiparesis.  He did not receive IV t-PA due to out of window.   Left arm and b/l ankle pain with left elbow swelling  No fracture or joint dislocation  tenderness on palpation at left shoulder, left elbow, b/l ankles. Left elbow swelling.   failed trials of steroids and colchicine.  MRI left elbow pending   leukocytosis  CXR negative  UA negative   BCx NGTD (4 days)  WBC 13.1-11.1  New ischemia at margin of right MCA encephalomalacia  MRI  Right MCA scattered small DWI restrication at the margin of encephalomalacia  CTA head and neck - unremarkable  2D Echo 08/2015 EF 60-65%. No source of embolus  LDL 55  HgbA1c - 6.2  Xarelto for VTE prophylaxis DIET DYS 2 Room service appropriate? Yes; Fluid consistency: Thin  Xarelto (rivaroxaban) daily prior to admission, now on Xarelto (rivaroxaban) daily.   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 known atrial fibrillation  [facility]  Residue left hemianopia and left hemiparesis  But able to walk with assistance at home  On Xarelto for stroke prevention PTA   Atrial Fibrillation  Home anticoagulation:  Xarelto (rivaroxaban) daily continued in the hospital  Continue xarelto at discharge    Hypertension  Stable  Permissive hypertension (OK if < 180/105) but gradually normalize in 5-7 days  Long-term BP goal normotensive  Hyperlipidemia  Home meds:  Pravachol 80, resumed in hospital  LDL 55, goal < 70  Continue statin at discharge  Other Stroke Risk  Factors  Advanced age  Former Cigarette smoker  Family hx stroke (maternal aunt)  Coronary artery disease - MI, CABG  Atrial fibrillation  Other Active Problems  Baseline dementia on namenda  Patchy apical lung opacities on CTA head and neck. May need follow-up.  Hospital day # 4  Marvel PlanJindong Arwin Bisceglia, MD PhD Stroke Neurology 12/18/2015 1:45 AM   To contact Stroke Continuity provider, please refer to [email or web address]. After hours, contact General Neurology

## 2015-12-17 NOTE — Progress Notes (Signed)
   12/17/15 1030  Clinical Encounter Type  Visited With Patient  Visit Type Follow-up  Spiritual Encounters  Spiritual Needs Emotional  Stress Factors  Patient Stress Factors Health changes  Stopped by Pt's room to inform him that AD unable to be completed as notary and volunteers unavailable. Check back Monday.

## 2015-12-18 DIAGNOSIS — I638 Other cerebral infarction: Secondary | ICD-10-CM

## 2015-12-18 DIAGNOSIS — M25522 Pain in left elbow: Secondary | ICD-10-CM

## 2015-12-18 DIAGNOSIS — I482 Chronic atrial fibrillation: Secondary | ICD-10-CM

## 2015-12-18 DIAGNOSIS — M25422 Effusion, left elbow: Secondary | ICD-10-CM | POA: Diagnosis present

## 2015-12-18 LAB — CULTURE, BLOOD (ROUTINE X 2)
CULTURE: NO GROWTH
Culture: NO GROWTH

## 2015-12-18 LAB — BASIC METABOLIC PANEL
ANION GAP: 8 (ref 5–15)
BUN: 16 mg/dL (ref 6–20)
CHLORIDE: 97 mmol/L — AB (ref 101–111)
CO2: 28 mmol/L (ref 22–32)
Calcium: 9.1 mg/dL (ref 8.9–10.3)
Creatinine, Ser: 0.68 mg/dL (ref 0.61–1.24)
GFR calc Af Amer: >60 mL/min (ref >60)
Glucose, Bld: 96 mg/dL (ref 65–99)
POTASSIUM: 3.6 mmol/L (ref 3.5–5.1)
SODIUM: 133 mmol/L — AB (ref 135–145)

## 2015-12-18 LAB — CBC
HCT: 32.7 % — ABNORMAL LOW (ref 39.0–52.0)
HEMOGLOBIN: 11.5 g/dL — AB (ref 13.0–17.0)
MCH: 30.4 pg (ref 26.0–34.0)
MCHC: 35.2 g/dL (ref 30.0–36.0)
MCV: 86.5 fL (ref 78.0–100.0)
PLATELETS: 576 10*3/uL — AB (ref 150–400)
RBC: 3.78 MIL/uL — AB (ref 4.22–5.81)
RDW: 12.9 % (ref 11.5–15.5)
WBC: 12.7 10*3/uL — AB (ref 4.0–10.5)

## 2015-12-18 LAB — SEDIMENTATION RATE: SED RATE: 100 mm/h — AB (ref 0–16)

## 2015-12-18 LAB — C-REACTIVE PROTEIN: CRP: 1.9 mg/dL — AB (ref <1.0)

## 2015-12-18 MED ORDER — GADOBENATE DIMEGLUMINE 529 MG/ML IV SOLN
10.0000 mL | Freq: Once | INTRAVENOUS | Status: AC | PRN
  Administered 2015-12-18: 10 mL via INTRAVENOUS

## 2015-12-18 NOTE — Progress Notes (Signed)
Per MD hold Pt's Xarelto for DG Arthro Elbow Left. Per MD this will be done tomorrow.

## 2015-12-18 NOTE — Progress Notes (Addendum)
PROGRESS NOTE        PATIENT DETAILS Name: Calvin Diaz Age: 75 y.o. Sex: male Date of Birth: May 29, 1940 Admit Date: 12/13/2015 Admitting Physician Joseph Art, DO ZOX:[id number],UJWJ CHARLES, MD  Brief Narrative: Patient is a 75 y.o. male recent CVA, atrial fibrillation on anticoagulation who presented to the ED with 2-3 week history of waxing and waning confusion, intermittent slurred speech and inability to ambulate due to pain in his left upper and lower extremity. See below for further details.  Subjective: Mildly confused this morning-continues to have left elbow pain and significant left elbow swelling.  Assessment/Plan: Fever with Polyarticular arthritis(Left elbow, shoulder and ankle): Had one episode of fever on admission-none since then. He was seen by orthopedics, who thought patient did not have enough effusion for an arthrocentesis, recommendations were to start anti-inflammatory. Patient was subsequently started on steroids on 11/22, and colchicine on 11/23-although he has left ankle and left shoulder swelling and pain improved, his left elbow swelling has worsened slightly and is still very tender. MRI of the left elbow area confirms a large effusion, case was discussed with orthopedics on call-Dr. Lucrezia Europe recommended arthrocentesis by radiology. Subsequently spoke with radiology, recommendations are to hold Xarelto today and tomorrow-radiology will try and perform a arthrocentesis under fluoroscopy tomorrow. For now continue with steroids and colcichine. Blood cultures continued to be negative, uric acid levels not elevated.   Acute CVA: Poor historian-he does have some amount of dementia at baseline-not sure if he is unable to move his left side due to pain from polyarticular arthritis or he has had another small CVA. MRI brain shows a possible small acute CVA in the right parietal lobe-stroke team following-continue Xarelto for now. 2-D  echocardiogram without any embolic source, CT angiogram of the head and neck without any major stenosis.   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   Hypertension: Controlled, continue amlodipine and Terazosin  Hypokalemia: Replete and recheck  Dementia with mild delirium: Continue Namenda.  Chronic insomnia: Resume belsomra as outpatient  Patchy apical lung opacities: Seen on CT angiogram of the neck, no obvious infiltrates seen on chest x-ray. Radiology recommends to repeat CT in 6 months.   DVT Prophylaxis: Full dose anticoagulation with Xarelto  Code Status: DNR  Family Communication: None at bedside  Disposition Plan: Remain inpatient- SNF on discharge-probably early next week  Antimicrobial agents: See below  Procedures: None  CONSULTS:  neurology  Time spent: 25 minutes-Greater than 50% of this time was spent in counseling, explanation of diagnosis, planning of further management, and coordination of care.  MEDICATIONS: Anti-infectives    Start     Dose/Rate Route Frequency Ordered Stop   12/14/15 0600  vancomycin (VANCOCIN) 500 mg in sodium chloride 0.9 % 100 mL IVPB  Status:  Discontinued     500 mg 100 mL/hr over 60 Minutes Intravenous Every 12 hours 12/13/15 1752 12/13/15 2049   12/13/15 2300  piperacillin-tazobactam (ZOSYN) IVPB 3.375 g  Status:  Discontinued     3.375 g 12.5 mL/hr over 240 Minutes Intravenous Every 8 hours 12/13/15 1752 12/13/15  2049   12/13/15 1700  vancomycin (VANCOCIN) IVPB 1000 mg/200 mL premix     1,000 mg 200 mL/hr over 60 Minutes Intravenous  Once 12/13/15 1653 12/13/15 2006   12/13/15  1700  piperacillin-tazobactam (ZOSYN) IVPB 3.375 g     3.375 g 100 mL/hr over 30 Minutes Intravenous  Once 12/13/15 1653 12/13/15 1747      Scheduled Meds: . feeding supplement (ENSURE ENLIVE)  237 mL Oral BID BM  . memantine  10 mg Oral Daily  . multivitamin with minerals  1 tablet Oral Daily  . pravastatin  80 mg Oral QHS  . predniSONE  40 mg Oral Q breakfast  . rivaroxaban  20 mg Oral Q supper  . sodium chloride flush  3 mL Intravenous Q12H  . terazosin  1 mg Oral QHS   Continuous Infusions: PRN Meds:.acetaminophen **OR** acetaminophen   PHYSICAL EXAM: Vital signs: Vitals:   12/17/15 1447 12/17/15 2111 12/18/15 0535 12/18/15 1428  BP: 122/60 123/60 110/62 116/61  Pulse: 85 84 77 84  Resp: 17 17 16    Temp: 98.3 F (36.8 C) 98.7 F (37.1 C) 97.9 F (36.6 C) 98.2 F (36.8 C)  TempSrc: Oral Oral Oral Oral  SpO2: 98% 100% 100% 94%  Weight:      Height:       Filed Weights   12/13/15 2151 12/15/15 0553  Weight: 56.5 kg (124 lb 9 oz) 59.6 kg (131 lb 6.3 oz)   Body mass index is 18.85 kg/m.   General appearance :Slightly confused Eyes:, pupils equally reactive to light and accomodation,no scleral icterus.Pink conjunctiva HEENT: Atraumatic and Normocephalic Neck: supple, no JVD. No cervical lymphadenopathy. No thyromegaly Resp:Good air entry bilaterally, no added sounds  CVS: S1 S2 irregular.  GI: Bowel sounds present, Non tender and not distended with no gaurding, rigidity or rebound.No organomegaly Extremities: Significant left elbow swelling that is tender. Neurology: Hard to discern  left upper weakness/strength due to pain from swollen left elbow-but after multiple attempts-seems able to lift left upper extremity against-his left lower extremity is significantly improved, and is close to around 4/5 in strength.  5/5 in right upper and right lower extremity. Musculoskeletal:No digital cyanosis Skin:No Rash, warm and dry Wounds:N/A  I have personally reviewed  following labs and imaging studies  LABORATORY DATA: CBC:  Recent Labs Lab 12/13/15 1615 12/14/15 0250 12/17/15 0629 12/18/15 0433  WBC 13.1* 11.1* 11.1* 12.7*  NEUTROABS 10.3*  --   --   --   HGB 12.5* 11.4* 12.0* 11.5*  HCT 36.9* 34.3* 34.7* 32.7*  MCV 88.7 89.6 87.2 86.5  PLT 416* 403* 498* 576*    Basic Metabolic Panel:  Recent Labs Lab 12/15/15 0911 12/15/15 1456 12/16/15 0445 12/17/15 0629 12/18/15 0433  NA 136 134* 133* 133* 133*  K 3.2* 3.2* 4.3 3.7 3.6  CL 104 99* 100* 97* 97*  CO2 24 26 24 27 28   GLUCOSE 132* 124* 142* 96 96  BUN 8 9 11 11 16   CREATININE 0.65 0.76 0.77 0.68 0.68  CALCIUM 8.5* 8.7* 8.7* 9.0 9.1  MG 1.8  --   --   --   --     GFR: Estimated Creatinine Clearance: 68.3 mL/min (by C-G formula based on SCr of 0.68 mg/dL).  Liver Function Tests:  Recent Labs Lab 12/13/15 1615  AST 34  ALT 24  ALKPHOS 65  BILITOT 0.4  PROT 8.1  ALBUMIN 2.8*    Recent Labs Lab 12/13/15 1615  LIPASE 20  No results for input(s): AMMONIA in the last 168 hours.  Coagulation Profile: No results for input(s): INR, PROTIME in the last 168 hours.  Cardiac Enzymes:  Recent Labs Lab 12/13/15 2102 12/14/15 0250  TROPONINI 0.03* 0.03*    BNP (last 3 results) No results for input(s): PROBNP in the last 8760 hours.  HbA1C:  Recent Labs  12/16/15 0445  HGBA1C 6.2*    CBG: No results for input(s): GLUCAP in the last 168 hours.  Lipid Profile:  Recent Labs  12/16/15 0445  CHOL 100  HDL 36*  LDLCALC 55  TRIG 47  CHOLHDL 2.8    Thyroid Function Tests: No results for input(s): TSH, T4TOTAL, FREET4, T3FREE, THYROIDAB in the last 72 hours.  Anemia Panel: No results for input(s): VITAMINB12, FOLATE, FERRITIN, TIBC, IRON, RETICCTPCT in the last 72 hours.  Urine analysis:    Component Value Date/Time   COLORURINE AMBER (A) 12/13/2015 1602   APPEARANCEUR CLEAR 12/13/2015 1602   LABSPEC 1.023 12/13/2015 1602   PHURINE 6.0  12/13/2015 1602   GLUCOSEU NEGATIVE 12/13/2015 1602   HGBUR NEGATIVE 12/13/2015 1602   BILIRUBINUR SMALL (A) 12/13/2015 1602   KETONESUR NEGATIVE 12/13/2015 1602   PROTEINUR 30 (A) 12/13/2015 1602   NITRITE NEGATIVE 12/13/2015 1602   LEUKOCYTESUR NEGATIVE 12/13/2015 1602    Sepsis Labs: Lactic Acid, Venous    Component Value Date/Time   LATICACIDVEN 0.73 12/13/2015 1931    MICROBIOLOGY: Recent Results (from the past 240 hour(s))  Culture, blood (Routine X 2) w Reflex to ID Panel     Status: None (Preliminary result)   Collection Time: 12/13/15  5:07 PM  Result Value Ref Range Status   Specimen Description BLOOD LEFT FOREARM  Final   Special Requests BOTTLES DRAWN AEROBIC AND ANAEROBIC 5CC  Final   Culture NO GROWTH 4 DAYS  Final   Report Status PENDING  Incomplete  Culture, blood (Routine X 2) w Reflex to ID Panel     Status: None (Preliminary result)   Collection Time: 12/13/15  5:07 PM  Result Value Ref Range Status   Specimen Description BLOOD RIGHT FOREARM  Final   Special Requests BOTTLES DRAWN AEROBIC AND ANAEROBIC 5CC  Final   Culture NO GROWTH 4 DAYS  Final   Report Status PENDING  Incomplete    RADIOLOGY STUDIES/RESULTS: Ct Angio Head W Or Wo Contrast  Result Date: 12/15/2015 CLINICAL DATA:  History of dementia. Slurred speech and decreased movement in the left arm. EXAM: CT ANGIOGRAPHY HEAD AND NECK TECHNIQUE: Multidetector CT imaging of the head and neck was performed using the standard protocol during bolus administration of intravenous contrast. Multiplanar CT image reconstructions and MIPs were obtained to evaluate the vascular anatomy. Carotid stenosis measurements (when applicable) are obtained utilizing NASCET criteria, using the distal internal carotid diameter as the denominator. CONTRAST:  50 cc Isovue 370 intravenous COMPARISON:  Brain MRI from yesterday FINDINGS: CT HEAD FINDINGS Brain: Acute on chronic ischemia in the right MCA territory is poorly seen by  CT. No acute hemorrhage. Remote left MCA territory artifact involving large portion of the lateral left frontal lobe atrophy with ventriculomegaly. Vascular: Extensive atherosclerotic calcification. Skull: Negative Sinuses: Negative Orbits: Negative Review of the MIP images confirms the above findings CTA NECK FINDINGS Aortic arch: Extensive atherosclerotic plaque. No aneurysm or dissection. Right carotid system: Atherosclerotic plaque predominately at the carotid bifurcation and in the proximal ICA with no flow limiting stenosis of 50% or greater. No ulceration or dissection. Second mixed density plaque in  the distal ICA at the skullbase without stenosis. No vessel beading. Left carotid system: Mild ostial narrowing due to mixed density plaque. Predominantly calcified plaque at the carotid bifurcation without ICA stenosis. Prominent calcified plaque in the ICA at the skullbase without flow limiting stenosis. No ulceration or dissection. Vertebral arteries: No proximal flow limiting stenosis in the subclavian arteries. Moderate proximal left vertebral artery stenosis. Mild right ostial stenosis at calcified plaque. No evidence of dissection. Skeleton: No acute or aggressive finding. Advanced cervical disc degeneration. Other neck: No incidental mass or adenopathy. Upper chest: There are spiculated reticular nodular densities at the apices, some containing central calcification at the right apex. This calcification favors postinfectious scarring. Need correlation for acute infectious symptoms the malignancy history. Review of the MIP images confirms the above findings CTA HEAD FINDINGS Anterior circulation: Extensive calcified plaque on the carotid siphons. Assessment of luminal narrowing is limited by blooming calcium, no flow limiting stenosis suspected. No reversible flow limiting stenosis in the proximal circulation. Negative for aneurysm. Complete circle-of-Willis. Posterior circulation: Symmetric vertebral  arteries. Smooth and widely patent vertebral and basilar arteries. No major branch occlusion or flow limiting stenosis. Venous sinuses: Patent as permitted by contrast timing Anatomic variants: None, Delayed phase: No parenchymal enhancement or mass Review of the MIP images confirms the above findings IMPRESSION: 1. No acute arterial finding. No embolic source or flow limiting stenosis in the right anterior circulation to explain acute infarcts. 2. Moderate left V1 segment stenosis. 3. Advanced chronic ischemic injury which obscures the acute right MCA territory infarct by CT. No hemorrhagic conversion or evolution since brain MRI yesterday. 4. Patchy apical lung opacities that are nonspecific. Some contain central calcifications on the right, favoring remote infection. Recommend chest x-ray correlation to ensure no acute opacities compared to recent priors. If clinically appropriate, chest CT in 6 months could follow-up the small nodular areas. Electronically Signed   By: Marnee Spring M.D.   On: 12/15/2015 17:00   Dg Chest 2 View  Result Date: 12/07/2015 CLINICAL DATA:  Cough for 2 weeks, hypertension. EXAM: CHEST  2 VIEW COMPARISON:  09/06/2015 FINDINGS: There is hyperinflation of the lungs compatible with COPD. Heart and mediastinal contours are within normal limits. No focal opacities or effusions. No acute bony abnormality. IMPRESSION: COPD.  No active disease. Electronically Signed   By: Charlett Nose M.D.   On: 12/07/2015 16:06   Dg Elbow 2 Views Left  Result Date: 12/15/2015 CLINICAL DATA:  Swelling and pain to LEFT shoulder, LEFT ankle on LEFT elbow today, no known injury EXAM: LEFT ELBOW - 2 VIEW COMPARISON:  None FINDINGS: Mild osseous demineralization. Joint spaces preserved. Small elbow joint effusion. No acute fracture, dislocation, or bone destruction. Question soft tissue swelling and elbow region. IMPRESSION: Small joint effusion. No acute osseous abnormalities otherwise identified.  Electronically Signed   By: Ulyses Southward M.D.   On: 12/15/2015 17:15   Dg Ankle 2 Views Left  Result Date: 12/15/2015 CLINICAL DATA:  Swelling and pain to LEFT shoulder, LEFT ankle on LEFT elbow today, no known injury EXAM: LEFT ANKLE - 2 VIEW COMPARISON:  None FINDINGS: Osseous demineralization. Joint spaces preserved. Mild soft tissue swelling greatest laterally. Superimposed artifacts at calcaneus on lateral view. No acute fracture, dislocation, or bone destruction. IMPRESSION: No acute osseous abnormalities. Electronically Signed   By: Ulyses Southward M.D.   On: 12/15/2015 17:15   Ct Head Wo Contrast  Result Date: 12/07/2015 CLINICAL DATA:  LEFT side pain weakness for 3 months,  history of stroke on 06/23/2015, dementia, hypertension, former smoker EXAM: CT HEAD WITHOUT CONTRAST TECHNIQUE: Contiguous axial images were obtained from the base of the skull through the vertex without intravenous contrast. COMPARISON:  Non FINDINGS: Brain: Generalized atrophy. Mild ex vacuo dilatation of the ventricular system. No midline shift or mass effect. LEFT frontal infarct. Larger RIGHT frontal and parietal infarcts. BILATERAL frontal infarcts appear old. RIGHT parietal infarct is likely subacute to old. No intracranial hemorrhage, mass lesion or extra-axial fluid collection. Vascular: Scattered atherosclerotic calcifications at the carotid siphons bilaterally Skull: Intact Sinuses/Orbits: Clear Other: N/A IMPRESSION: Atrophy with small vessel chronic ischemic changes of deep cerebral white matter. LEFT frontal and RIGHT frontal/parietal infarcts as above without intracranial hemorrhage. Electronically Signed   By: Ulyses Southward M.D.   On: 12/07/2015 14:22   Ct Angio Neck W Or Wo Contrast  Result Date: 12/15/2015 CLINICAL DATA:  History of dementia. Slurred speech and decreased movement in the left arm. EXAM: CT ANGIOGRAPHY HEAD AND NECK TECHNIQUE: Multidetector CT imaging of the head and neck was performed using the  standard protocol during bolus administration of intravenous contrast. Multiplanar CT image reconstructions and MIPs were obtained to evaluate the vascular anatomy. Carotid stenosis measurements (when applicable) are obtained utilizing NASCET criteria, using the distal internal carotid diameter as the denominator. CONTRAST:  50 cc Isovue 370 intravenous COMPARISON:  Brain MRI from yesterday FINDINGS: CT HEAD FINDINGS Brain: Acute on chronic ischemia in the right MCA territory is poorly seen by CT. No acute hemorrhage. Remote left MCA territory artifact involving large portion of the lateral left frontal lobe atrophy with ventriculomegaly. Vascular: Extensive atherosclerotic calcification. Skull: Negative Sinuses: Negative Orbits: Negative Review of the MIP images confirms the above findings CTA NECK FINDINGS Aortic arch: Extensive atherosclerotic plaque. No aneurysm or dissection. Right carotid system: Atherosclerotic plaque predominately at the carotid bifurcation and in the proximal ICA with no flow limiting stenosis of 50% or greater. No ulceration or dissection. Second mixed density plaque in the distal ICA at the skullbase without stenosis. No vessel beading. Left carotid system: Mild ostial narrowing due to mixed density plaque. Predominantly calcified plaque at the carotid bifurcation without ICA stenosis. Prominent calcified plaque in the ICA at the skullbase without flow limiting stenosis. No ulceration or dissection. Vertebral arteries: No proximal flow limiting stenosis in the subclavian arteries. Moderate proximal left vertebral artery stenosis. Mild right ostial stenosis at calcified plaque. No evidence of dissection. Skeleton: No acute or aggressive finding. Advanced cervical disc degeneration. Other neck: No incidental mass or adenopathy. Upper chest: There are spiculated reticular nodular densities at the apices, some containing central calcification at the right apex. This calcification favors  postinfectious scarring. Need correlation for acute infectious symptoms the malignancy history. Review of the MIP images confirms the above findings CTA HEAD FINDINGS Anterior circulation: Extensive calcified plaque on the carotid siphons. Assessment of luminal narrowing is limited by blooming calcium, no flow limiting stenosis suspected. No reversible flow limiting stenosis in the proximal circulation. Negative for aneurysm. Complete circle-of-Willis. Posterior circulation: Symmetric vertebral arteries. Smooth and widely patent vertebral and basilar arteries. No major branch occlusion or flow limiting stenosis. Venous sinuses: Patent as permitted by contrast timing Anatomic variants: None, Delayed phase: No parenchymal enhancement or mass Review of the MIP images confirms the above findings IMPRESSION: 1. No acute arterial finding. No embolic source or flow limiting stenosis in the right anterior circulation to explain acute infarcts. 2. Moderate left V1 segment stenosis. 3. Advanced chronic ischemic injury which  obscures the acute right MCA territory infarct by CT. No hemorrhagic conversion or evolution since brain MRI yesterday. 4. Patchy apical lung opacities that are nonspecific. Some contain central calcifications on the right, favoring remote infection. Recommend chest x-ray correlation to ensure no acute opacities compared to recent priors. If clinically appropriate, chest CT in 6 months could follow-up the small nodular areas. Electronically Signed   By: Marnee Spring M.D.   On: 12/15/2015 17:00   Mr Brain Wo Contrast  Result Date: 12/14/2015 CLINICAL DATA:  Declining health for 2 weeks. Difficulty ambulating. Slurred speech. Decreased movement in left arm. EXAM: MRI HEAD WITHOUT CONTRAST TECHNIQUE: Multiplanar, multiecho pulse sequences of the brain and surrounding structures were obtained without intravenous contrast. COMPARISON:  Head CT 12/07/2015 FINDINGS: Despite efforts by the technologist and  patient, motion artifact is present on today's examination and could not be eliminated. This reduces the sensitivity and specificity of the study. There is a small area of diffusion restriction within the right parietal lobe, which is in the region of encephalomalacia associated with prior large right MCA infarct. There is also extensive left frontal encephalomalacia and right occipital encephalomalacia. No midline shift or other significant mass effect. There is ex vacuo dilatation of the ventricles. No extra-axial collection. IMPRESSION: 1. Severely motion degraded examination. 2. Within the above limitation, there is a small area of acute ischemia superimposed on the area of encephalomalacia associated with the remote right MCA infarct. This is along the right postcentral gyrus. 3. Large areas of encephalomalacia in the right MCA territory and left frontal lobe. Electronically Signed   By: Deatra Robinson M.D.   On: 12/14/2015 05:26   Mr Elbow Left W Wo Contrast  Result Date: 12/18/2015 CLINICAL DATA:  Painful left elbow.  Frozen joint.  Swelling. EXAM: MRI OF THE LEFT ELBOW WITHOUT AND WITH CONTRAST TECHNIQUE: Multiplanar, multisequence MR imaging of the elbow was performed before and after the administration of intravenous contrast. CONTRAST:  10mL MULTIHANCE GADOBENATE DIMEGLUMINE 529 MG/ML IV SOLN COMPARISON:  12/15/2015 FINDINGS: The elbow was imaged in 60 degrees of flexion which reduces diagnostic sensitivity and specificity. The patient was unable to fully extend the elbow for imaging. TENDONS Common forearm flexor origin: Unremarkable Common forearm extensor origin: Unremarkable Biceps: Unremarkable Triceps: Mild tendinopathy. LIGAMENTS Medial stabilizers: Unremarkable Lateral stabilizers: Lateral ulnar collateral ligament suboptimally seen but probably intact. Radial collateral ligament appears intact. Cartilage: Mild degenerative chondral thinning. Joint: Joint effusion with enhancing margins and  synovitis especially posteriorly. Posteriorly there appears to be extension of abnormal fluid with enhancing margins up into the triceps muscle for example on image 10/14. Cubital tunnel: Enhancing tissue likely representing synovitis from the joint extends towards the proximal margin of the cubital tunnel adjacent to the median nerve as on image 11/12. There is some accentuated increased T2 signal in the median nerve on image 16/8 with potential associated nerve enhancement on images 17-20 of series 12. Bones: Low-grade marrow edema in the ulna at the insertion site of the triceps. No significant associated degree of enhancement in the bone. Musculature and soft tissues: There is abnormal edema signal tracking in the distal triceps musculature. Extensive subcutaneous edema with some low-level enhancement and considerable thickening of the subcutaneous tissues in the medial antecubital region. There is also dorsal subcutaneous edema and low-grade enhancement along the elbow. IMPRESSION: 1. Large elbow joint effusion with synovitis. Posteriorly, there appears to be an extension of this effusion dissecting into the triceps muscle with thick enhancing margins and associated  abnormal enhancement within the triceps muscle distally, for example image 11/14. Although a sterile synovitis due to inflammatory arthropathy is possible, septic joint is not excluded. There is no overt bony edema to further suggest osteomyelitis. 2. Some of the posterior extension of fluid and inflammation extends near the median nerve in knee cubital tunnel, potentially with some secondary mild inflammation of the median nerve. 3. Extensive subcutaneous edema around the elbow most prominent along the medial antecubital region but extending into the upper arm and forearm as well. Electronically Signed   By: Gaylyn RongWalter  Liebkemann M.D.   On: 12/18/2015 08:57   Dg Chest Port 1 View  Result Date: 12/13/2015 CLINICAL DATA:  75 year old male with  shortness of breath and confusion EXAM: PORTABLE CHEST 1 VIEW COMPARISON:  Prior chest x-ray 12/07/2015 FINDINGS: Cardiac and mediastinal contours remain unchanged. Atherosclerotic calcifications present in the thoracic aorta. No pulmonary edema, focal airspace consolidation, large pleural effusion or pneumothorax. Hyperinflation and central bronchitic changes are similar compared to prior. No acute osseous abnormality. IMPRESSION: 1. No active cardiopulmonary disease. 2.  Aortic Atherosclerosis (ICD10-170.0) 3. Hyperinflation and central bronchitic changes suggest COPD. Electronically Signed   By: Malachy MoanHeath  McCullough M.D.   On: 12/13/2015 16:18   Dg Shoulder Left  Result Date: 12/15/2015 CLINICAL DATA:  Swelling and pain to LEFT shoulder, LEFT ankle on LEFT elbow today, no known injury EXAM: LEFT SHOULDER - 2+ VIEW COMPARISON:  None FINDINGS: Osseous demineralization. AC joint alignment normal. No acute fracture, dislocation, or bone destruction. Visualized LEFT ribs intact. IMPRESSION: No acute osseous abnormalities. Electronically Signed   By: Ulyses SouthwardMark  Boles M.D.   On: 12/15/2015 17:16   Dg Swallowing Func-speech Pathology  Result Date: 12/14/2015 Objective Swallowing Evaluation: Type of Study: MBS-Modified Barium Swallow Study Patient Details Name: Jeani HawkingJames Hellstrom MRN: [id number] Date of Birth: March 28, 1940 Today's Date: 12/14/2015 Time: SLP Start Time (ACUTE ONLY): 1030-SLP Stop Time (ACUTE ONLY): 1045 SLP Time Calculation (min) (ACUTE ONLY): 15 min Past Medical History: Past Medical History: Diagnosis Date . Dementia  . High cholesterol  . Hypertension  . Myocardial infarction  . Stroke [facility](HCC)  Past Surgical History: Past Surgical History: Procedure Laterality Date . CORONARY ANGIOPLASTY   HPI: [name] a 75 y.o.malewith medical history significant of dementia, HTn, a fib, HLD, prior right CVA with residual deficits, details not provided. Per family upon admission, patient has been declining in health  2 weeks. Patient normally can ambulate by himself but since Friday he has been unable to do so. He also now has slurred speech as well as decreased movement in his left arm. Found to have a rectal temp of 101.7, he had an increase in his white blood cell count. Chest x-ray was negative for pneumonia, UA was negative for infection,blood cultures have been ordered 2. MRI shows new infarct in right parietal lobe, in same area as prior CVA.  No Data Recorded Assessment / Plan / Recommendation CHL IP CLINICAL IMPRESSIONS 12/14/2015 Therapy Diagnosis Moderate oral phase dysphagia;Moderate pharyngeal phase dysphagia Clinical Impression Pt demosntrates moderate dysphagia, mostly attributable to cognitive impariment.   Mastication is prolonged, verbal cues needed for transit. Pt orally holds all boluses and struggles to initaite swallow response. Verbal cues and visual cues to give next bolus aid in swallow initiation. Regardless, all boluses reach the pyriform sinuses prior to intiation. WIth upright posture there is no penetration or aspiration observed, no residual. Pt is recommended to consume a dys 2/thin liquid diet with full supervision for verbal cueing and assist.  Pt is at risk for aspiration if posture is suboptimal and also at risk for poor nutrition/hydration given lack of initiation. Will follow for swallowing and cognitive therapy.    Impact on safety and function Moderate aspiration risk;Risk for inadequate nutrition/hydration   CHL IP TREATMENT RECOMMENDATION 12/14/2015 Treatment Recommendations Therapy as outlined in treatment plan below   Prognosis 12/14/2015 Prognosis for Safe Diet Advancement Fair Barriers to Reach Goals Cognitive deficits Barriers/Prognosis Comment -- CHL IP DIET RECOMMENDATION 12/14/2015 SLP Diet Recommendations Dysphagia 2 (Fine chop) solids;Thin liquid Liquid Administration via Cup;Straw Medication Administration Crushed with puree Compensations Minimize environmental  distractions;Other (Comment) Postural Changes Seated upright at 90 degrees   CHL IP OTHER RECOMMENDATIONS 12/14/2015 Recommended Consults -- Oral Care Recommendations Oral care BID Other Recommendations Have oral suction available   CHL IP FOLLOW UP RECOMMENDATIONS 12/14/2015 Follow up Recommendations Inpatient Rehab   CHL IP FREQUENCY AND DURATION 12/14/2015 Speech Therapy Frequency (ACUTE ONLY) min 2x/week Treatment Duration 2 weeks      CHL IP ORAL PHASE 12/14/2015 Oral Phase Impaired Oral - Pudding Teaspoon -- Oral - Pudding Cup -- Oral - Honey Teaspoon -- Oral - Honey Cup -- Oral - Nectar Teaspoon -- Oral - Nectar Cup -- Oral - Nectar Straw -- Oral - Thin Teaspoon -- Oral - Thin Cup Holding of bolus;Delayed oral transit Oral - Thin Straw Holding of bolus;Delayed oral transit Oral - Puree Delayed oral transit;Holding of bolus Oral - Mech Soft -- Oral - Regular Holding of bolus;Reduced posterior propulsion;Decreased bolus cohesion;Delayed oral transit Oral - Multi-Consistency -- Oral - Pill Decreased bolus cohesion;Delayed oral transit;Reduced posterior propulsion;Holding of bolus;Other (Comment) Oral Phase - Comment --  CHL IP PHARYNGEAL PHASE 12/14/2015 Pharyngeal Phase Impaired Pharyngeal- Pudding Teaspoon -- Pharyngeal -- Pharyngeal- Pudding Cup -- Pharyngeal -- Pharyngeal- Honey Teaspoon -- Pharyngeal -- Pharyngeal- Honey Cup -- Pharyngeal -- Pharyngeal- Nectar Teaspoon -- Pharyngeal -- Pharyngeal- Nectar Cup -- Pharyngeal -- Pharyngeal- Nectar Straw -- Pharyngeal -- Pharyngeal- Thin Teaspoon -- Pharyngeal -- Pharyngeal- Thin Cup Delayed swallow initiation-pyriform sinuses Pharyngeal -- Pharyngeal- Thin Straw Delayed swallow initiation-pyriform sinuses Pharyngeal -- Pharyngeal- Puree Delayed swallow initiation-pyriform sinuses Pharyngeal -- Pharyngeal- Mechanical Soft -- Pharyngeal -- Pharyngeal- Regular Delayed swallow initiation-pyriform sinuses Pharyngeal -- Pharyngeal- Multi-consistency -- Pharyngeal --  Pharyngeal- Pill -- Pharyngeal -- Pharyngeal Comment --  No flowsheet data found. No flowsheet data found. Harlon DittyBonnie DeBlois, [place]MA CCC-SLP [phone] [name], Bonnie Caroline 12/14/2015, 11:09 AM                LOS: 5 days   Jeoffrey MassedGHIMIRE,Jamaul Heist, MD  [facility] Pager:[phone]  If 7PM-7AM, please contact night-coverage [email or web address] Password [email or web address] 12/18/2015, 2:43 PM

## 2015-12-18 NOTE — Progress Notes (Signed)
STROKE TEAM PROGRESS NOTE   SUBJECTIVE (INTERVAL HISTORY) No family is at bedside. He describes left arm pain but nothing new, he is alert and oriented.     OBJECTIVE Temp:  [97.9 F (36.6 C)-98.7 F (37.1 C)] 97.9 F (36.6 C) (11/25 0535) Pulse Rate:  [77-85] 77 (11/25 0535) Cardiac Rhythm: Normal sinus rhythm (11/25 0711) Resp:  [16-17] 16 (11/25 0535) BP: (110-123)/(60-62) 110/62 (11/25 0535) SpO2:  [98 %-100 %] 100 % (11/25 0535)  CBC:   Recent Labs Lab 12/13/15 1615  12/17/15 0629 12/18/15 0433  WBC 13.1*  < > 11.1* 12.7*  NEUTROABS 10.3*  --   --   --   HGB 12.5*  < > 12.0* 11.5*  HCT 36.9*  < > 34.7* 32.7*  MCV 88.7  < > 87.2 86.5  PLT 416*  < > 498* 576*  < > = values in this interval not displayed.  Basic Metabolic Panel:   Recent Labs Lab 12/15/15 0911  12/17/15 0629 12/18/15 0433  NA 136  < > 133* 133*  K 3.2*  < > 3.7 3.6  CL 104  < > 97* 97*  CO2 24  < > 27 28  GLUCOSE 132*  < > 96 96  BUN 8  < > 11 16  CREATININE 0.65  < > 0.68 0.68  CALCIUM 8.5*  < > 9.0 9.1  MG 1.8  --   --   --   < > = values in this interval not displayed.  Lipid Panel:     Component Value Date/Time   CHOL 100 12/16/2015 0445   TRIG 47 12/16/2015 0445   HDL 36 (L) 12/16/2015 0445   CHOLHDL 2.8 12/16/2015 0445   VLDL 9 12/16/2015 0445   LDLCALC 55 12/16/2015 0445   HgbA1c:  Lab Results  Component Value Date   HGBA1C 6.2 (H) 12/16/2015   Urine Drug Screen:     Component Value Date/Time   LABOPIA NONE DETECTED 12/15/2015 1535   COCAINSCRNUR NONE DETECTED 12/15/2015 1535   LABBENZ NONE DETECTED 12/15/2015 1535   AMPHETMU NONE DETECTED 12/15/2015 1535   THCU NONE DETECTED 12/15/2015 1535   LABBARB NONE DETECTED 12/15/2015 1535      IMAGING I have personally reviewed the radiological images below and agree with the radiology interpretations.  Mr Brain Wo Contrast 12/14/2015 1. Severely motion degraded examination.  2. Within the above limitation, there is  a small area of acute ischemia superimposed on the area of encephalomalacia associated with the remote right MCA infarct. This is along the right postcentral gyrus.  3. Large areas of encephalomalacia in the right MCA territory and left frontal lobe.   Dg Chest Port 1 View 12/13/2015 1. No active cardiopulmonary disease.  2. Aortic Atherosclerosis (ICD10-170.0)  3. Hyperinflation and central bronchitic changes suggest COPD.   TTE 08/2015 - Left ventricle: The cavity size was normal. Systolic function was   normal. The estimated ejection fraction was in the range of 60%   to 65%. Wall motion was normal; there were no regional wall   motion abnormalities. The study is not technically sufficient to   allow evaluation of LV diastolic function. - Aortic valve: Nodular calcificatio nof left and non coronary   cusps - Mitral valve: Calcified annulus. - Right atrium: The atrium was mildly dilated. - Atrial septum: No defect or patent foramen ovale was identified.  CTA head and neck  12/15/2015 1. No acute arterial finding. No embolic source or flow limiting stenosis  in the right anterior circulation to explain acute infarcts. 2. Moderate left V1 segment stenosis. 3. Advanced chronic ischemic injury which obscures the acute right MCA territory infarct by CT. No hemorrhagic conversion or evolution since brain MRI yesterday. 4. Patchy apical lung opacities that are nonspecific. Some contain central calcifications on the right, favoring remote infection. Recommend chest x-ray correlation to ensure no acute opacities compared to recent priors. If clinically appropriate, chest CT in 6 months could follow-up the small nodular areas.  Dg Elbow  IMPRESSION:  Small joint effusion.  No acute osseous abnormalities otherwise identified.   Dg Ankle  12/15/2015 IMPRESSION:  No acute osseous abnormalities.   Dg Shoulder  12/15/2015 IMPRESSION:  No acute osseous abnormalities.   MRI left elbow  pending   PHYSICAL EXAM  Temp:  [97.9 F (36.6 C)-98.7 F (37.1 C)] 97.9 F (36.6 C) (11/25 0535) Pulse Rate:  [77-85] 77 (11/25 0535) Resp:  [16-17] 16 (11/25 0535) BP: (110-123)/(60-62) 110/62 (11/25 0535) SpO2:  [98 %-100 %] 100 % (11/25 0535)  Exam unchanged today, stable:  General - Well nourished, well developed, pain at left arm with significant guarding.  Ophthalmologic - Fundi not visualized due to noncooperation.  Cardiovascular - irregularly irregular heart rate and rhythm.  Extremities - tenderness on palpation at left shoulder, left elbow, b/l ankles. Left elbow swelling.   Mental Status -  Level of arousal and orientation to time, and person were intact, however, not orientated to time. Language including expression, naming, repetition was assessed and found intact, able to follow simple commands but not 2-step commands. Left neglect.  Cranial Nerves II - XII - II - left hemianopia. III, IV, VI - Extraocular movements intact. V - Facial sensation intact bilaterally. VII - left nasolabial fold flattening. VIII - hard of hearing & vestibular intact bilaterally. X - Palate elevates symmetrically, mild dysarthria. XI - Chin turning & shoulder shrug intact bilaterally. XII - Tongue protrusion intact.  Motor Strength - The patient's strength was 4/5 RUE and 3/5 RLE. However, there is significant guarding of LUE with tenderness on palpation of left shoulder, left elbow with left elbow swollen. LLE 2/5 and tenderness on palpitation at ankle. Bulk was normal and fasciculations were absent.   Motor Tone - Muscle tone was assessed at the neck and appendages and was normal except left UE not able to test due to significant guarding.  Reflexes - The patient's reflexes were 1+ in all extremities except LUE and he had no pathological reflexes.  Gait and Station - not tested   ASSESSMENT/PLAN Mr. Rashad Auld is a 75 y.o. male with history of dementia, HTN, a fib, HLD  admitted with declining health x 2 weeks. In hospital, developed slurred speech and LUE hemiparesis.  He did not receive IV t-PA due to out of window.   New ischemia at margin of right MCA encephalomalacia  MRI  Right MCA scattered small DWI restrication at the margin of encephalomalacia  CTA head and neck - unremarkable  2D Echo 08/2015 EF 60-65%. No source of embolus   LDL 55  HgbA1c - 6.2  Xarelto for VTE prophylaxis DIET DYS 2 Room service appropriate? Yes; Fluid consistency: Thin  Xarelto (rivaroxaban) daily prior to admission, now on Xarelto (rivaroxaban) daily.   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 known  atrial fibrillation  [facility]  Residue left hemianopia and left hemiparesis  But able to walk with assistance at home  On Xarelto for stroke prevention PTA   Atrial Fibrillation  Home anticoagulation:  Xarelto (rivaroxaban) daily continued in the hospital  Continue xarelto at discharge    Hypertension  Stable  Permissive hypertension (OK if < 180/105) but gradually normalize in 5-7 days  Long-term BP goal normotensive  Hyperlipidemia  Home meds:  Pravachol 80, resumed in hospital  LDL 55, goal < 70  Continue statin at discharge  Other Stroke Risk Factors  Advanced age  Former Cigarette smoker  Family hx stroke (maternal aunt)  Coronary artery disease - MI, CABG  Atrial fibrillation  Other Active Problems  Baseline dementia on namenda  Patchy apical lung opacities on CTA head and neck. May need follow-up.  Hospital day # 5  Stroke team will sign off at this time, please re-consult if needed  Personally examined patient and images, and have participated in and made any corrections needed to history, physical, neuro  exam,assessment and plan as stated above.  I have personally obtained the history, evaluated lab date, reviewed imaging studies and agree with radiology interpretations.    Naomie DeanAntonia Trilby Way, MD Stroke Neurology    To contact Stroke Continuity provider, please refer to [email or web address]. After hours, contact General Neurology

## 2015-12-19 ENCOUNTER — Inpatient Hospital Stay (HOSPITAL_COMMUNITY): Payer: Medicare Other

## 2015-12-19 DIAGNOSIS — M25422 Effusion, left elbow: Secondary | ICD-10-CM

## 2015-12-19 MED ORDER — IOPAMIDOL (ISOVUE-M 200) INJECTION 41%
INTRAMUSCULAR | Status: AC
  Filled 2015-12-19: qty 20

## 2015-12-19 NOTE — Progress Notes (Signed)
PROGRESS NOTE        PATIENT DETAILS Name: Calvin Diaz Age: 75 y.o. Sex: male Date of Birth: 21-May-1940 Admit Date: 12/13/2015 Admitting Physician Joseph Art, DO ZOX:[id number],UJWJ CHARLES, MD  Brief Narrative: Patient is a 75 y.o. male recent CVA, atrial fibrillation on anticoagulation who presented to the ED with 2-3 week history of waxing and waning confusion, intermittent slurred speech, painful/swollen left elbow and left shoulder/ankle. Patient also had one episode of fever in the emergency room.   Subjective: Mostly alert today-continues to have significant amount of swelling and tenderness in his left elbow-radiology unable to perform arthrocentesis today, scheduled for tomorrow.   Assessment/Plan: Fever with Polyarticular arthritis(Left elbow, shoulder and ankle): Had one episode of fever on admission-none since then. Seen by orthopedics, who thought patient did not have enough effusion for an arthrocentesis, recommendations were to start anti-inflammatory agents. 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 Very difficult exam as patient appears to have some amount of contracture in his left upper extremity from a prior CVA. A MRI of the left elbow area confirms a large effusion, case was discussed with orthopedics on call-Dr. Lucrezia Europe recommended arthrocentesis by radiology. After discussion with radiology on 11/25-Xarelto was placed on hold. Arthrocentesis was attempted by radiology on 11/26-unfortunately unsuccessful mostly due to patient's inability to get into an appropriate position, radiology will reattempt on 11/27. For now continue with steroids and colcichine. Blood cultures continued to be negative, uric acid levels not elevated. RA factor and ANA pending.  Acute  CVA: Poor historian-he does have some amount of dementia at baseline-not sure if he is unable to move his left side due to pain from polyarticular arthritis or he has had another small CVA. MRI brain shows a possible small acute CVA in the right parietal lobe-stroke team following-continue Xarelto for now. 2-D echocardiogram without any embolic source, CT angiogram of the head and neck without any major stenosis.   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  Dyslipidemia: LDL 57-continue statin  Persistent Atrial fibrillation: Back in sinus rhythm-Not on any rate control medications, continue Xarelto.CHADSVASC score of 4. Was seen by cardiology as an outpatient on 11/15-plans were forr DCCV-after 4 weeks of uninterrupted Xarelto therapy-probably not needed as patient has converted to sinus rhythm spontaneously.   Hypertension: Controlled, continue amlodipine and Terazosin  Hypokalemia: Replete and recheck  Dementia with mild delirium: Continue Namenda.  Chronic insomnia: Resume belsomra as outpatient  Patchy apical lung opacities: Seen on CT angiogram of the neck, no obvious infiltrates seen on chest x-ray. Radiology recommends to repeat CT in 6 months.   DVT Prophylaxis: Full dose anticoagulation with Xarelto  Code Status: DNR  Family Communication: None at bedside  Disposition Plan: Remain inpatient- SNF on discharge-probably in the next few days.  Antimicrobial agents: See below  Procedures: None  CONSULTS:  neurology  Time spent: 25 minutes-Greater than 50% of this time was spent in counseling, explanation of diagnosis, planning of further management, and coordination of care.  MEDICATIONS: Anti-infectives    Start     Dose/Rate Route Frequency Ordered Stop   12/14/15 0600  vancomycin (  VANCOCIN) 500 mg in sodium chloride 0.9 % 100 mL IVPB   Status:  Discontinued     500 mg 100 mL/hr over 60 Minutes Intravenous Every 12 hours 12/13/15 1752 12/13/15 2049   12/13/15 2300  piperacillin-tazobactam (ZOSYN) IVPB 3.375 g  Status:  Discontinued     3.375 g 12.5 mL/hr over 240 Minutes Intravenous Every 8 hours 12/13/15 1752 12/13/15 2049   12/13/15 1700  vancomycin (VANCOCIN) IVPB 1000 mg/200 mL premix     1,000 mg 200 mL/hr over 60 Minutes Intravenous  Once 12/13/15 1653 12/13/15 2006   12/13/15 1700  piperacillin-tazobactam (ZOSYN) IVPB 3.375 g     3.375 g 100 mL/hr over 30 Minutes Intravenous  Once 12/13/15 1653 12/13/15 1747      Scheduled Meds: . feeding supplement (ENSURE ENLIVE)  237 mL Oral BID BM  . memantine  10 mg Oral Daily  . multivitamin with minerals  1 tablet Oral Daily  . pravastatin  80 mg Oral QHS  . predniSONE  40 mg Oral Q breakfast  . sodium chloride flush  3 mL Intravenous Q12H  . terazosin  1 mg Oral QHS   Continuous Infusions: PRN Meds:.acetaminophen **OR** acetaminophen   PHYSICAL EXAM: Vital signs: Vitals:   12/18/15 0535 12/18/15 1428 12/18/15 2108 12/19/15 0459  BP: 110/62 116/61 129/62 134/75  Pulse: 77 84 85 84  Resp: 16  18 18   Temp: 97.9 F (36.6 C) 98.2 F (36.8 C) 98.2 F (36.8 C) 97.8 F (36.6 C)  TempSrc: Oral Oral Oral Oral  SpO2: 100% 94% 99% 96%  Weight:      Height:       Filed Weights   12/13/15 2151 12/15/15 0553  Weight: 56.5 kg (124 lb 9 oz) 59.6 kg (131 lb 6.3 oz)   Body mass index is 18.85 kg/m.   General appearance :Slightly confused Eyes:, pupils equally reactive to light and accomodation,no scleral icterus.Pink conjunctiva HEENT: Atraumatic and Normocephalic Neck: supple, no JVD. No cervical lymphadenopathy. No thyromegaly Resp:Good air entry bilaterally, no added sounds  CVS: S1 S2 irregular.  GI: Bowel sounds present, Non tender and not distended with no gaurding, rigidity or rebound.No organomegaly Extremities: Significant left elbow swelling that is  tender. Neurology: Hard to discern  left upper weakness/strength due to pain from swollen left elbow-but after multiple attempts-seems able to lift left upper extremity against-his left lower extremity is significantly improved, and is close to around 4/5 in strength.  5/5 in right upper and right lower extremity. Musculoskeletal:No digital cyanosis Skin:No Rash, warm and dry Wounds:N/A  I have personally reviewed following labs and imaging studies  LABORATORY DATA: CBC:  Recent Labs Lab 12/13/15 1615 12/14/15 0250 12/17/15 0629 12/18/15 0433  WBC 13.1* 11.1* 11.1* 12.7*  NEUTROABS 10.3*  --   --   --   HGB 12.5* 11.4* 12.0* 11.5*  HCT 36.9* 34.3* 34.7* 32.7*  MCV 88.7 89.6 87.2 86.5  PLT 416* 403* 498* 576*    Basic Metabolic Panel:  Recent Labs Lab 12/15/15 0911 12/15/15 1456 12/16/15 0445 12/17/15 0629 12/18/15 0433  NA 136 134* 133* 133* 133*  K 3.2* 3.2* 4.3 3.7 3.6  CL 104 99* 100* 97* 97*  CO2 24 26 24 27 28   GLUCOSE 132* 124* 142* 96 96  BUN 8 9 11 11 16   CREATININE 0.65 0.76 0.77 0.68 0.68  CALCIUM 8.5* 8.7* 8.7* 9.0 9.1  MG 1.8  --   --   --   --  GFR: Estimated Creatinine Clearance: 68.3 mL/min (by C-G formula based on SCr of 0.68 mg/dL).  Liver Function Tests:  Recent Labs Lab 12/13/15 1615  AST 34  ALT 24  ALKPHOS 65  BILITOT 0.4  PROT 8.1  ALBUMIN 2.8*    Recent Labs Lab 12/13/15 1615  LIPASE 20   No results for input(s): AMMONIA in the last 168 hours.  Coagulation Profile: No results for input(s): INR, PROTIME in the last 168 hours.  Cardiac Enzymes:  Recent Labs Lab 12/13/15 2102 12/14/15 0250  TROPONINI 0.03* 0.03*    BNP (last 3 results) No results for input(s): PROBNP in the last 8760 hours.  HbA1C: No results for input(s): HGBA1C in the last 72 hours.  CBG: No results for input(s): GLUCAP in the last 168 hours.  Lipid Profile: No results for input(s): CHOL, HDL, LDLCALC, TRIG, CHOLHDL, LDLDIRECT in the  last 72 hours.  Thyroid Function Tests: No results for input(s): TSH, T4TOTAL, FREET4, T3FREE, THYROIDAB in the last 72 hours.  Anemia Panel: No results for input(s): VITAMINB12, FOLATE, FERRITIN, TIBC, IRON, RETICCTPCT in the last 72 hours.  Urine analysis:    Component Value Date/Time   COLORURINE AMBER (A) 12/13/2015 1602   APPEARANCEUR CLEAR 12/13/2015 1602   LABSPEC 1.023 12/13/2015 1602   PHURINE 6.0 12/13/2015 1602   GLUCOSEU NEGATIVE 12/13/2015 1602   HGBUR NEGATIVE 12/13/2015 1602   BILIRUBINUR SMALL (A) 12/13/2015 1602   KETONESUR NEGATIVE 12/13/2015 1602   PROTEINUR 30 (A) 12/13/2015 1602   NITRITE NEGATIVE 12/13/2015 1602   LEUKOCYTESUR NEGATIVE 12/13/2015 1602    Sepsis Labs: Lactic Acid, Venous    Component Value Date/Time   LATICACIDVEN 0.73 12/13/2015 1931    MICROBIOLOGY: Recent Results (from the past 240 hour(s))  Culture, blood (Routine X 2) w Reflex to ID Panel     Status: None   Collection Time: 12/13/15  5:07 PM  Result Value Ref Range Status   Specimen Description BLOOD LEFT FOREARM  Final   Special Requests BOTTLES DRAWN AEROBIC AND ANAEROBIC 5CC  Final   Culture NO GROWTH 5 DAYS  Final   Report Status 12/18/2015 FINAL  Final  Culture, blood (Routine X 2) w Reflex to ID Panel     Status: None   Collection Time: 12/13/15  5:07 PM  Result Value Ref Range Status   Specimen Description BLOOD RIGHT FOREARM  Final   Special Requests BOTTLES DRAWN AEROBIC AND ANAEROBIC 5CC  Final   Culture NO GROWTH 5 DAYS  Final   Report Status 12/18/2015 FINAL  Final    RADIOLOGY STUDIES/RESULTS: Ct Angio Head W Or Wo Contrast  Result Date: 12/15/2015 CLINICAL DATA:  History of dementia. Slurred speech and decreased movement in the left arm. EXAM: CT ANGIOGRAPHY HEAD AND NECK TECHNIQUE: Multidetector CT imaging of the head and neck was performed using the standard protocol during bolus administration of intravenous contrast. Multiplanar CT image reconstructions  and MIPs were obtained to evaluate the vascular anatomy. Carotid stenosis measurements (when applicable) are obtained utilizing NASCET criteria, using the distal internal carotid diameter as the denominator. CONTRAST:  50 cc Isovue 370 intravenous COMPARISON:  Brain MRI from yesterday FINDINGS: CT HEAD FINDINGS Brain: Acute on chronic ischemia in the right MCA territory is poorly seen by CT. No acute hemorrhage. Remote left MCA territory artifact involving large portion of the lateral left frontal lobe atrophy with ventriculomegaly. Vascular: Extensive atherosclerotic calcification. Skull: Negative Sinuses: Negative Orbits: Negative Review of the MIP images confirms the above findings  CTA NECK FINDINGS Aortic arch: Extensive atherosclerotic plaque. No aneurysm or dissection. Right carotid system: Atherosclerotic plaque predominately at the carotid bifurcation and in the proximal ICA with no flow limiting stenosis of 50% or greater. No ulceration or dissection. Second mixed density plaque in the distal ICA at the skullbase without stenosis. No vessel beading. Left carotid system: Mild ostial narrowing due to mixed density plaque. Predominantly calcified plaque at the carotid bifurcation without ICA stenosis. Prominent calcified plaque in the ICA at the skullbase without flow limiting stenosis. No ulceration or dissection. Vertebral arteries: No proximal flow limiting stenosis in the subclavian arteries. Moderate proximal left vertebral artery stenosis. Mild right ostial stenosis at calcified plaque. No evidence of dissection. Skeleton: No acute or aggressive finding. Advanced cervical disc degeneration. Other neck: No incidental mass or adenopathy. Upper chest: There are spiculated reticular nodular densities at the apices, some containing central calcification at the right apex. This calcification favors postinfectious scarring. Need correlation for acute infectious symptoms the malignancy history. Review of the MIP  images confirms the above findings CTA HEAD FINDINGS Anterior circulation: Extensive calcified plaque on the carotid siphons. Assessment of luminal narrowing is limited by blooming calcium, no flow limiting stenosis suspected. No reversible flow limiting stenosis in the proximal circulation. Negative for aneurysm. Complete circle-of-Willis. Posterior circulation: Symmetric vertebral arteries. Smooth and widely patent vertebral and basilar arteries. No major branch occlusion or flow limiting stenosis. Venous sinuses: Patent as permitted by contrast timing Anatomic variants: None, Delayed phase: No parenchymal enhancement or mass Review of the MIP images confirms the above findings IMPRESSION: 1. No acute arterial finding. No embolic source or flow limiting stenosis in the right anterior circulation to explain acute infarcts. 2. Moderate left V1 segment stenosis. 3. Advanced chronic ischemic injury which obscures the acute right MCA territory infarct by CT. No hemorrhagic conversion or evolution since brain MRI yesterday. 4. Patchy apical lung opacities that are nonspecific. Some contain central calcifications on the right, favoring remote infection. Recommend chest x-ray correlation to ensure no acute opacities compared to recent priors. If clinically appropriate, chest CT in 6 months could follow-up the small nodular areas. Electronically Signed   By: Marnee Spring M.D.   On: 12/15/2015 17:00   Dg Chest 2 View  Result Date: 12/07/2015 CLINICAL DATA:  Cough for 2 weeks, hypertension. EXAM: CHEST  2 VIEW COMPARISON:  09/06/2015 FINDINGS: There is hyperinflation of the lungs compatible with COPD. Heart and mediastinal contours are within normal limits. No focal opacities or effusions. No acute bony abnormality. IMPRESSION: COPD.  No active disease. Electronically Signed   By: Charlett Nose M.D.   On: 12/07/2015 16:06   Dg Elbow 2 Views Left  Result Date: 12/15/2015 CLINICAL DATA:  Swelling and pain to LEFT  shoulder, LEFT ankle on LEFT elbow today, no known injury EXAM: LEFT ELBOW - 2 VIEW COMPARISON:  None FINDINGS: Mild osseous demineralization. Joint spaces preserved. Small elbow joint effusion. No acute fracture, dislocation, or bone destruction. Question soft tissue swelling and elbow region. IMPRESSION: Small joint effusion. No acute osseous abnormalities otherwise identified. Electronically Signed   By: Ulyses Southward M.D.   On: 12/15/2015 17:15   Dg Ankle 2 Views Left  Result Date: 12/15/2015 CLINICAL DATA:  Swelling and pain to LEFT shoulder, LEFT ankle on LEFT elbow today, no known injury EXAM: LEFT ANKLE - 2 VIEW COMPARISON:  None FINDINGS: Osseous demineralization. Joint spaces preserved. Mild soft tissue swelling greatest laterally. Superimposed artifacts at calcaneus on lateral view. No  acute fracture, dislocation, or bone destruction. IMPRESSION: No acute osseous abnormalities. Electronically Signed   By: Ulyses Southward M.D.   On: 12/15/2015 17:15   Ct Head Wo Contrast  Result Date: 12/07/2015 CLINICAL DATA:  LEFT side pain weakness for 3 months, history of stroke on 06/23/2015, dementia, hypertension, former smoker EXAM: CT HEAD WITHOUT CONTRAST TECHNIQUE: Contiguous axial images were obtained from the base of the skull through the vertex without intravenous contrast. COMPARISON:  Non FINDINGS: Brain: Generalized atrophy. Mild ex vacuo dilatation of the ventricular system. No midline shift or mass effect. LEFT frontal infarct. Larger RIGHT frontal and parietal infarcts. BILATERAL frontal infarcts appear old. RIGHT parietal infarct is likely subacute to old. No intracranial hemorrhage, mass lesion or extra-axial fluid collection. Vascular: Scattered atherosclerotic calcifications at the carotid siphons bilaterally Skull: Intact Sinuses/Orbits: Clear Other: N/A IMPRESSION: Atrophy with small vessel chronic ischemic changes of deep cerebral white matter. LEFT frontal and RIGHT frontal/parietal infarcts  as above without intracranial hemorrhage. Electronically Signed   By: Ulyses Southward M.D.   On: 12/07/2015 14:22   Ct Angio Neck W Or Wo Contrast  Result Date: 12/15/2015 CLINICAL DATA:  History of dementia. Slurred speech and decreased movement in the left arm. EXAM: CT ANGIOGRAPHY HEAD AND NECK TECHNIQUE: Multidetector CT imaging of the head and neck was performed using the standard protocol during bolus administration of intravenous contrast. Multiplanar CT image reconstructions and MIPs were obtained to evaluate the vascular anatomy. Carotid stenosis measurements (when applicable) are obtained utilizing NASCET criteria, using the distal internal carotid diameter as the denominator. CONTRAST:  50 cc Isovue 370 intravenous COMPARISON:  Brain MRI from yesterday FINDINGS: CT HEAD FINDINGS Brain: Acute on chronic ischemia in the right MCA territory is poorly seen by CT. No acute hemorrhage. Remote left MCA territory artifact involving large portion of the lateral left frontal lobe atrophy with ventriculomegaly. Vascular: Extensive atherosclerotic calcification. Skull: Negative Sinuses: Negative Orbits: Negative Review of the MIP images confirms the above findings CTA NECK FINDINGS Aortic arch: Extensive atherosclerotic plaque. No aneurysm or dissection. Right carotid system: Atherosclerotic plaque predominately at the carotid bifurcation and in the proximal ICA with no flow limiting stenosis of 50% or greater. No ulceration or dissection. Second mixed density plaque in the distal ICA at the skullbase without stenosis. No vessel beading. Left carotid system: Mild ostial narrowing due to mixed density plaque. Predominantly calcified plaque at the carotid bifurcation without ICA stenosis. Prominent calcified plaque in the ICA at the skullbase without flow limiting stenosis. No ulceration or dissection. Vertebral arteries: No proximal flow limiting stenosis in the subclavian arteries. Moderate proximal left vertebral  artery stenosis. Mild right ostial stenosis at calcified plaque. No evidence of dissection. Skeleton: No acute or aggressive finding. Advanced cervical disc degeneration. Other neck: No incidental mass or adenopathy. Upper chest: There are spiculated reticular nodular densities at the apices, some containing central calcification at the right apex. This calcification favors postinfectious scarring. Need correlation for acute infectious symptoms the malignancy history. Review of the MIP images confirms the above findings CTA HEAD FINDINGS Anterior circulation: Extensive calcified plaque on the carotid siphons. Assessment of luminal narrowing is limited by blooming calcium, no flow limiting stenosis suspected. No reversible flow limiting stenosis in the proximal circulation. Negative for aneurysm. Complete circle-of-Willis. Posterior circulation: Symmetric vertebral arteries. Smooth and widely patent vertebral and basilar arteries. No major branch occlusion or flow limiting stenosis. Venous sinuses: Patent as permitted by contrast timing Anatomic variants: None, Delayed phase: No parenchymal enhancement  or mass Review of the MIP images confirms the above findings IMPRESSION: 1. No acute arterial finding. No embolic source or flow limiting stenosis in the right anterior circulation to explain acute infarcts. 2. Moderate left V1 segment stenosis. 3. Advanced chronic ischemic injury which obscures the acute right MCA territory infarct by CT. No hemorrhagic conversion or evolution since brain MRI yesterday. 4. Patchy apical lung opacities that are nonspecific. Some contain central calcifications on the right, favoring remote infection. Recommend chest x-ray correlation to ensure no acute opacities compared to recent priors. If clinically appropriate, chest CT in 6 months could follow-up the small nodular areas. Electronically Signed   By: Marnee Spring M.D.   On: 12/15/2015 17:00   Mr Brain Wo Contrast  Result Date:  12/14/2015 CLINICAL DATA:  Declining health for 2 weeks. Difficulty ambulating. Slurred speech. Decreased movement in left arm. EXAM: MRI HEAD WITHOUT CONTRAST TECHNIQUE: Multiplanar, multiecho pulse sequences of the brain and surrounding structures were obtained without intravenous contrast. COMPARISON:  Head CT 12/07/2015 FINDINGS: Despite efforts by the technologist and patient, motion artifact is present on today's examination and could not be eliminated. This reduces the sensitivity and specificity of the study. There is a small area of diffusion restriction within the right parietal lobe, which is in the region of encephalomalacia associated with prior large right MCA infarct. There is also extensive left frontal encephalomalacia and right occipital encephalomalacia. No midline shift or other significant mass effect. There is ex vacuo dilatation of the ventricles. No extra-axial collection. IMPRESSION: 1. Severely motion degraded examination. 2. Within the above limitation, there is a small area of acute ischemia superimposed on the area of encephalomalacia associated with the remote right MCA infarct. This is along the right postcentral gyrus. 3. Large areas of encephalomalacia in the right MCA territory and left frontal lobe. Electronically Signed   By: Deatra Robinson M.D.   On: 12/14/2015 05:26   Mr Elbow Left W Wo Contrast  Result Date: 12/18/2015 CLINICAL DATA:  Painful left elbow.  Frozen joint.  Swelling. EXAM: MRI OF THE LEFT ELBOW WITHOUT AND WITH CONTRAST TECHNIQUE: Multiplanar, multisequence MR imaging of the elbow was performed before and after the administration of intravenous contrast. CONTRAST:  10mL MULTIHANCE GADOBENATE DIMEGLUMINE 529 MG/ML IV SOLN COMPARISON:  12/15/2015 FINDINGS: The elbow was imaged in 60 degrees of flexion which reduces diagnostic sensitivity and specificity. The patient was unable to fully extend the elbow for imaging. TENDONS Common forearm flexor origin:  Unremarkable Common forearm extensor origin: Unremarkable Biceps: Unremarkable Triceps: Mild tendinopathy. LIGAMENTS Medial stabilizers: Unremarkable Lateral stabilizers: Lateral ulnar collateral ligament suboptimally seen but probably intact. Radial collateral ligament appears intact. Cartilage: Mild degenerative chondral thinning. Joint: Joint effusion with enhancing margins and synovitis especially posteriorly. Posteriorly there appears to be extension of abnormal fluid with enhancing margins up into the triceps muscle for example on image 10/14. Cubital tunnel: Enhancing tissue likely representing synovitis from the joint extends towards the proximal margin of the cubital tunnel adjacent to the median nerve as on image 11/12. There is some accentuated increased T2 signal in the median nerve on image 16/8 with potential associated nerve enhancement on images 17-20 of series 12. Bones: Low-grade marrow edema in the ulna at the insertion site of the triceps. No significant associated degree of enhancement in the bone. Musculature and soft tissues: There is abnormal edema signal tracking in the distal triceps musculature. Extensive subcutaneous edema with some low-level enhancement and considerable thickening of the subcutaneous tissues in  the medial antecubital region. There is also dorsal subcutaneous edema and low-grade enhancement along the elbow. IMPRESSION: 1. Large elbow joint effusion with synovitis. Posteriorly, there appears to be an extension of this effusion dissecting into the triceps muscle with thick enhancing margins and associated abnormal enhancement within the triceps muscle distally, for example image 11/14. Although a sterile synovitis due to inflammatory arthropathy is possible, septic joint is not excluded. There is no overt bony edema to further suggest osteomyelitis. 2. Some of the posterior extension of fluid and inflammation extends near the median nerve in knee cubital tunnel, potentially  with some secondary mild inflammation of the median nerve. 3. Extensive subcutaneous edema around the elbow most prominent along the medial antecubital region but extending into the upper arm and forearm as well. Electronically Signed   By: Gaylyn RongWalter  Liebkemann M.D.   On: 12/18/2015 08:57   Dg Chest Port 1 View  Result Date: 12/13/2015 CLINICAL DATA:  75 year old male with shortness of breath and confusion EXAM: PORTABLE CHEST 1 VIEW COMPARISON:  Prior chest x-ray 12/07/2015 FINDINGS: Cardiac and mediastinal contours remain unchanged. Atherosclerotic calcifications present in the thoracic aorta. No pulmonary edema, focal airspace consolidation, large pleural effusion or pneumothorax. Hyperinflation and central bronchitic changes are similar compared to prior. No acute osseous abnormality. IMPRESSION: 1. No active cardiopulmonary disease. 2.  Aortic Atherosclerosis (ICD10-170.0) 3. Hyperinflation and central bronchitic changes suggest COPD. Electronically Signed   By: Malachy MoanHeath  McCullough M.D.   On: 12/13/2015 16:18   Dg Shoulder Left  Result Date: 12/15/2015 CLINICAL DATA:  Swelling and pain to LEFT shoulder, LEFT ankle on LEFT elbow today, no known injury EXAM: LEFT SHOULDER - 2+ VIEW COMPARISON:  None FINDINGS: Osseous demineralization. AC joint alignment normal. No acute fracture, dislocation, or bone destruction. Visualized LEFT ribs intact. IMPRESSION: No acute osseous abnormalities. Electronically Signed   By: Ulyses SouthwardMark  Boles M.D.   On: 12/15/2015 17:16   Dg Swallowing Func-speech Pathology  Result Date: 12/14/2015 Objective Swallowing Evaluation: Type of Study: MBS-Modified Barium Swallow Study Patient Details Name: Jeani HawkingJames Dado MRN: [id number] Date of Birth: 07-01-1940 Today's Date: 12/14/2015 Time: SLP Start Time (ACUTE ONLY): 1030-SLP Stop Time (ACUTE ONLY): 1045 SLP Time Calculation (min) (ACUTE ONLY): 15 min Past Medical History: Past Medical History: Diagnosis Date . Dementia  . High cholesterol   . Hypertension  . Myocardial infarction  . Stroke [facility](HCC)  Past Surgical History: Past Surgical History: Procedure Laterality Date . CORONARY ANGIOPLASTY   HPI: [name] a 75 y.o.malewith medical history significant of dementia, HTn, a fib, HLD, prior right CVA with residual deficits, details not provided. Per family upon admission, patient has been declining in health 2 weeks. Patient normally can ambulate by himself but since Friday he has been unable to do so. He also now has slurred speech as well as decreased movement in his left arm. Found to have a rectal temp of 101.7, he had an increase in his white blood cell count. Chest x-ray was negative for pneumonia, UA was negative for infection,blood cultures have been ordered 2. MRI shows new infarct in right parietal lobe, in same area as prior CVA.  No Data Recorded Assessment / Plan / Recommendation CHL IP CLINICAL IMPRESSIONS 12/14/2015 Therapy Diagnosis Moderate oral phase dysphagia;Moderate pharyngeal phase dysphagia Clinical Impression Pt demosntrates moderate dysphagia, mostly attributable to cognitive impariment.   Mastication is prolonged, verbal cues needed for transit. Pt orally holds all boluses and struggles to initaite swallow response. Verbal cues and visual cues to give next  bolus aid in swallow initiation. Regardless, all boluses reach the pyriform sinuses prior to intiation. WIth upright posture there is no penetration or aspiration observed, no residual. Pt is recommended to consume a dys 2/thin liquid diet with full supervision for verbal cueing and assist. Pt is at risk for aspiration if posture is suboptimal and also at risk for poor nutrition/hydration given lack of initiation. Will follow for swallowing and cognitive therapy.    Impact on safety and function Moderate aspiration risk;Risk for inadequate nutrition/hydration   CHL IP TREATMENT RECOMMENDATION 12/14/2015 Treatment Recommendations Therapy as outlined in treatment  plan below   Prognosis 12/14/2015 Prognosis for Safe Diet Advancement Fair Barriers to Reach Goals Cognitive deficits Barriers/Prognosis Comment -- CHL IP DIET RECOMMENDATION 12/14/2015 SLP Diet Recommendations Dysphagia 2 (Fine chop) solids;Thin liquid Liquid Administration via Cup;Straw Medication Administration Crushed with puree Compensations Minimize environmental distractions;Other (Comment) Postural Changes Seated upright at 90 degrees   CHL IP OTHER RECOMMENDATIONS 12/14/2015 Recommended Consults -- Oral Care Recommendations Oral care BID Other Recommendations Have oral suction available   CHL IP FOLLOW UP RECOMMENDATIONS 12/14/2015 Follow up Recommendations Inpatient Rehab   CHL IP FREQUENCY AND DURATION 12/14/2015 Speech Therapy Frequency (ACUTE ONLY) min 2x/week Treatment Duration 2 weeks      CHL IP ORAL PHASE 12/14/2015 Oral Phase Impaired Oral - Pudding Teaspoon -- Oral - Pudding Cup -- Oral - Honey Teaspoon -- Oral - Honey Cup -- Oral - Nectar Teaspoon -- Oral - Nectar Cup -- Oral - Nectar Straw -- Oral - Thin Teaspoon -- Oral - Thin Cup Holding of bolus;Delayed oral transit Oral - Thin Straw Holding of bolus;Delayed oral transit Oral - Puree Delayed oral transit;Holding of bolus Oral - Mech Soft -- Oral - Regular Holding of bolus;Reduced posterior propulsion;Decreased bolus cohesion;Delayed oral transit Oral - Multi-Consistency -- Oral - Pill Decreased bolus cohesion;Delayed oral transit;Reduced posterior propulsion;Holding of bolus;Other (Comment) Oral Phase - Comment --  CHL IP PHARYNGEAL PHASE 12/14/2015 Pharyngeal Phase Impaired Pharyngeal- Pudding Teaspoon -- Pharyngeal -- Pharyngeal- Pudding Cup -- Pharyngeal -- Pharyngeal- Honey Teaspoon -- Pharyngeal -- Pharyngeal- Honey Cup -- Pharyngeal -- Pharyngeal- Nectar Teaspoon -- Pharyngeal -- Pharyngeal- Nectar Cup -- Pharyngeal -- Pharyngeal- Nectar Straw -- Pharyngeal -- Pharyngeal- Thin Teaspoon -- Pharyngeal -- Pharyngeal- Thin Cup Delayed  swallow initiation-pyriform sinuses Pharyngeal -- Pharyngeal- Thin Straw Delayed swallow initiation-pyriform sinuses Pharyngeal -- Pharyngeal- Puree Delayed swallow initiation-pyriform sinuses Pharyngeal -- Pharyngeal- Mechanical Soft -- Pharyngeal -- Pharyngeal- Regular Delayed swallow initiation-pyriform sinuses Pharyngeal -- Pharyngeal- Multi-consistency -- Pharyngeal -- Pharyngeal- Pill -- Pharyngeal -- Pharyngeal Comment --  No flowsheet data found. No flowsheet data found. Harlon Ditty, [place] CCC-SLP [phone] Claudine Mouton 12/14/2015, 11:09 AM                LOS: 6 days   Jeoffrey Massed, MD  [facility] Pager:[phone]  If 7PM-7AM, please contact night-coverage [email or web address] Password [email or web address] 12/19/2015, 1:43 PM

## 2015-12-20 ENCOUNTER — Inpatient Hospital Stay (HOSPITAL_COMMUNITY): Payer: Medicare Other

## 2015-12-20 DIAGNOSIS — L8990 Pressure ulcer of unspecified site, unspecified stage: Secondary | ICD-10-CM | POA: Insufficient documentation

## 2015-12-20 LAB — SYNOVIAL CELL COUNT + DIFF, W/ CRYSTALS
CRYSTALS FLUID: NONE SEEN
Eosinophils-Synovial: 0 % (ref 0–1)
LYMPHOCYTES-SYNOVIAL FLD: 48 % — AB (ref 0–20)
MONOCYTE-MACROPHAGE-SYNOVIAL FLUID: 47 % — AB (ref 50–90)
NEUTROPHIL, SYNOVIAL: 5 % (ref 0–25)
WBC, Synovial: 210 /mm3 — ABNORMAL HIGH (ref 0–200)

## 2015-12-20 LAB — RHEUMATOID FACTOR

## 2015-12-20 MED ORDER — SODIUM CHLORIDE 0.9 % IJ SOLN
INTRAMUSCULAR | Status: AC
  Filled 2015-12-20: qty 10

## 2015-12-20 MED ORDER — LIDOCAINE HCL 1 % IJ SOLN
INTRAMUSCULAR | Status: AC
  Filled 2015-12-20: qty 10

## 2015-12-20 MED ORDER — IOPAMIDOL (ISOVUE-M 200) INJECTION 41%
INTRAMUSCULAR | Status: AC
  Filled 2015-12-20: qty 10

## 2015-12-20 MED ORDER — LIDOCAINE HCL (PF) 1 % IJ SOLN
10.0000 mL | Freq: Once | INTRAMUSCULAR | Status: AC
  Administered 2015-12-20: 2.5 mL via INTRADERMAL
  Filled 2015-12-20: qty 10

## 2015-12-20 NOTE — Progress Notes (Signed)
Occupational Therapy Treatment Patient Details Name: Calvin Diaz MRN: [id number] DOB: 01/03/1941 Today's Date: 12/20/2015    History of present illness Calvin Diaz an 75 y.o.male(per chart) medical history significant of dementia, HTn, a fib, HLD. Per family who was at the bedside initially but not present duringmy exam patient has been declining in health 2 weeks.MRI of brain showed acute on subacute infarct on the Right.   OT comments  Focus of today's session was completion of grooming and oral care tasks while incorporating L attention/visual tracking.  Pt. Tolerated well. Will continue to follow acutely.    Follow Up Recommendations  CIR;Supervision/Assistance - 24 hour    Equipment Recommendations  Other (comment)    Recommendations for Other Services      Precautions / Restrictions Precautions Precautions: Fall Precaution Comments: h/o dementia       Mobility Bed Mobility                  Transfers                      Balance                                   ADL Overall ADL's : Needs assistance/impaired     Grooming: Wash/dry hands;Wash/dry face;Oral care;Minimal assistance;Moderate assistance;Cueing for sequencing;Cueing for compensatory techniques;Bed level                                 General ADL Comments: emphasis on encouraging L visual tracking and maintaining during communication (i stood on L side of bed).  pt. turned head to L and was able to stay focused in my direction during conversation.  able to perform washing face with set up.  min/mod a for washing L ue and fingers.  pt. reports L UE is painful.  did better when he used his R ue to guide and move L ue.  states "i appreciate that you are letting me help and do it myself".  during oral care noted to hold t.paste in his mouth for a long time.  required cues to initate spitting into basin.  following one step commands consistently but with  increased time.        Vision                 Additional Comments: able to turn to the L and maintain attention to the left during conversation today   Perception     Praxis      Cognition   Behavior During Therapy: Facey Medical FoundationWFL for tasks assessed/performed Overall Cognitive Status: Impaired/Different from baseline          Following Commands: Follows one step commands with increased time     Problem Solving: Slow processing;Decreased initiation;Difficulty sequencing;Requires verbal cues;Requires tactile cues General Comments: pt with L sided neglect    Extremity/Trunk Assessment               Exercises     Shoulder Instructions       General Comments      Pertinent Vitals/ Pain       Pain Assessment:  (denies but grimaced with movement of LUE)  Home Living  Prior Functioning/Environment              Frequency  Min 2X/week        Progress Toward Goals  OT Goals(current goals can now be found in the care plan section)  Progress towards OT goals: Progressing toward goals     Plan Discharge plan remains appropriate    Co-evaluation                 End of Session     Activity Tolerance Patient tolerated treatment well   Patient Left in bed;with call bell/phone within reach   Nurse Communication          Time: 0949-1001 OT Time Calculation (min): 12 min  Charges: OT General Charges $OT Visit: 1 Procedure OT Treatments $Self Care/Home Management : 8-22 mins  Calvin Diaz, Calvin Diaz, [name] 12/20/2015, 10:37 AM

## 2015-12-20 NOTE — Care Management Important Message (Signed)
Important Message  Patient Details  Name: Calvin Diaz MRN: [id number] Date of Birth: [birth year]   Medicare Important Message Given:  Yes    Calvin Diaz, Calvin Diaz 12/20/2015, 12:05 PM

## 2015-12-20 NOTE — Progress Notes (Signed)
PROGRESS NOTE    Calvin Diaz  ZOX:[id number] DOB: 1940/07/31 DOA: 12/13/2015 PCP: Carollee HerterLALONDE,JOHN CHARLES, MD     Brief Narrative:  Patient is a 75 y.o. male recent CVA, atrial fibrillation on anticoagulation who presented to the ED with 2-3 week history of waxing and waning confusion, intermittent slurred speech, painful/swollen left elbow and left shoulder/ankle. Patient also had one episode of fever in the emergency room.    Assessment & Plan:   Principal Problem:   CVA (cerebral vascular accident) (HCC) Active Problems:   History of CVA with residual deficit   Hyperlipidemia   Essential hypertension   Atrial fibrillation (HCC)   Dementia   Fever   SIRS (systemic inflammatory response syndrome) (HCC)   Hypokalemia   Dysphagia, post-stroke   Tachycardia   Leukocytosis   Acute blood loss anemia   Polyarticular arthritis   Shoulder pain, left   Swelling   Delirium   Elbow swelling, left   Left elbow pain   Pressure injury of skin    Fever with Polyarticular arthritis (Left elbow, shoulder and ankle): Had one episode of fever on admission-none since then. Seen by orthopedics, who thought patient did not have enough effusion for an arthrocentesis, recommendations were to start anti-inflammatory agents. 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 Very difficult exam as patient appears to have some amount of contracture in his left upper extremity from a prior CVA. A MRI of the left elbow area confirms a large effusion, case was discussed with orthopedics on call-Dr. Lucrezia EuropeJason Rogers-who recommended arthrocentesis by radiology. After discussion with radiology on 11/25-Xarelto was placed on hold. Arthrocentesis by IR on 11/27, fluid studies pending. For now continue with steroids and colcichine. Blood cultures  continued to be negative, uric acid levels not elevated. RA factor and ANA pending.  Acute CVA: Poor historian-he does have some amount of dementia at baseline-not sure if he is unable to move his left side due to pain from polyarticular arthritis or he has had another small CVA. MRI brain shows a possible small acute CVA in the right parietal lobe. Stroke team consulted -continue Xarelto for now. 2-D echocardiogram without any embolic source, CT angiogram of the head and neck without any major stenosis.   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  Dyslipidemia: LDL 57-continue statin  Persistent Atrial fibrillation: Back in sinus rhythm-Not on any rate control medications, continue Xarelto.CHADSVASC score of 4. Was seen by cardiology as an outpatient on 11/15-plans were forr DCCV-after 4 weeks of uninterrupted Xarelto therapy-probably not needed as patient has converted to sinus rhythm spontaneously.   Hypertension: Controlled, continue amlodipine and Terazosin  Hypokalemia: Replete and recheck  Dementia with mild delirium: Continue Namenda.  Chronic insomnia: Resume belsomra as outpatient  Patchy apical lung opacities: Seen on CT angiogram of the neck, no obvious infiltrates seen on chest x-ray. Radiology recommends to repeat CT in 6 months.    DVT prophylaxis: xarelto Code Status: DNR Family Communication: no family at bedside Disposition Plan: SNF when stable    Consultants:   Neurology  Orthopedic  Procedures:   None  Antimicrobials:   Zosyn 11/20 once in ED  Vanco 11/20 once in ED   Subjective: Patient underwent left elbow arthrocentesis today. He states that his pain has  improved overall, but continues to be weak in his left upper extremity. No other complaints.  Objective: Vitals:   12/19/15 0459 12/19/15 1542 12/19/15 2135  12/20/15 0547  BP: 134/75 114/63 129/84 140/77  Pulse: 84 81 85 82  Resp: 18 18  20   Temp: 97.8 F (36.6 C) 98.4 F (36.9 C) 98.3 F (36.8 C) 98.5 F (36.9 C)  TempSrc: Oral Oral Oral   SpO2: 96% 96% 99% 100%  Weight:      Height:        Intake/Output Summary (Last 24 hours) at 12/20/15 0833 Last data filed at 12/19/15 1544  Gross per 24 hour  Intake                0 ml  Output              700 ml  Net             -700 ml   Filed Weights   12/13/15 2151 12/15/15 0553  Weight: 56.5 kg (124 lb 9 oz) 59.6 kg (131 lb 6.3 oz)    Examination:  General exam: Appears calm and comfortable, remains confused  Respiratory system: Clear to auscultation. Respiratory effort normal. Cardiovascular system: S1 & S2 heard, RRR. No JVD, murmurs, rubs, gallops or clicks. No pedal edema. Gastrointestinal system: Abdomen is nondistended, soft and nontender. No organomegaly or masses felt. Normal bowel sounds heard. Central nervous system: Alert and oriented. No focal neurological deficits. Extremities: LUE 3/5 in strength unclear if limited by pain, motivation, baseline function, LLE 4/5 in strength, RUE and RLE 5/5 in strength, left elbow with some effusion but no erythema   Data Reviewed: I have personally reviewed following labs and imaging studies  CBC:  Recent Labs Lab 12/13/15 1615 12/14/15 0250 12/17/15 0629 12/18/15 0433  WBC 13.1* 11.1* 11.1* 12.7*  NEUTROABS 10.3*  --   --   --   HGB 12.5* 11.4* 12.0* 11.5*  HCT 36.9* 34.3* 34.7* 32.7*  MCV 88.7 89.6 87.2 86.5  PLT 416* 403* 498* 576*   Basic Metabolic Panel:  Recent Labs Lab 12/15/15 0911 12/15/15 1456 12/16/15 0445 12/17/15 0629 12/18/15 0433  NA 136 134* 133* 133* 133*  K 3.2* 3.2* 4.3 3.7 3.6  CL 104 99* 100* 97* 97*  CO2 24 26 24 27 28   GLUCOSE 132* 124* 142* 96 96  BUN 8 9 11 11 16   CREATININE 0.65 0.76 0.77 0.68 0.68  CALCIUM 8.5* 8.7* 8.7* 9.0 9.1  MG 1.8  --   --   --   --    GFR: Estimated  Creatinine Clearance: 68.3 mL/min (by C-G formula based on SCr of 0.68 mg/dL). Liver Function Tests:  Recent Labs Lab 12/13/15 1615  AST 34  ALT 24  ALKPHOS 65  BILITOT 0.4  PROT 8.1  ALBUMIN 2.8*    Recent Labs Lab 12/13/15 1615  LIPASE 20   No results for input(s): AMMONIA in the last 168 hours. Coagulation Profile: No results for input(s): INR, PROTIME in the last 168 hours. Cardiac Enzymes:  Recent Labs Lab 12/13/15 2102 12/14/15 0250  TROPONINI 0.03* 0.03*   BNP (last 3 results) No results for input(s): PROBNP in the last 8760 hours. HbA1C: No results for input(s): HGBA1C in the last 72 hours. CBG: No results for input(s): GLUCAP in the last 168 hours. Lipid Profile: No results for input(s): CHOL, HDL, LDLCALC, TRIG, CHOLHDL, LDLDIRECT in the last 72 hours. Thyroid Function Tests: No results for input(s):  TSH, T4TOTAL, FREET4, T3FREE, THYROIDAB in the last 72 hours. Anemia Panel: No results for input(s): VITAMINB12, FOLATE, FERRITIN, TIBC, IRON, RETICCTPCT in the last 72 hours. Sepsis Labs:  Recent Labs Lab 12/13/15 1628 12/13/15 1931  LATICACIDVEN 1.23 0.73    Recent Results (from the past 240 hour(s))  Culture, blood (Routine X 2) w Reflex to ID Panel     Status: None   Collection Time: 12/13/15  5:07 PM  Result Value Ref Range Status   Specimen Description BLOOD LEFT FOREARM  Final   Special Requests BOTTLES DRAWN AEROBIC AND ANAEROBIC 5CC  Final   Culture NO GROWTH 5 DAYS  Final   Report Status 12/18/2015 FINAL  Final  Culture, blood (Routine X 2) w Reflex to ID Panel     Status: None   Collection Time: 12/13/15  5:07 PM  Result Value Ref Range Status   Specimen Description BLOOD RIGHT FOREARM  Final   Special Requests BOTTLES DRAWN AEROBIC AND ANAEROBIC 5CC  Final   Culture NO GROWTH 5 DAYS  Final   Report Status 12/18/2015 FINAL  Final       Radiology Studies: No results found.    Scheduled Meds: . feeding supplement (ENSURE  ENLIVE)  237 mL Oral BID BM  . memantine  10 mg Oral Daily  . multivitamin with minerals  1 tablet Oral Daily  . pravastatin  80 mg Oral QHS  . predniSONE  40 mg Oral Q breakfast  . sodium chloride flush  3 mL Intravenous Q12H  . terazosin  1 mg Oral QHS   Continuous Infusions:   LOS: 7 days    Time spent: 40 minutes   Noralee StainJennifer Honestii Marton, DO [facility] [email or web address] Password [email or web address] 12/20/2015, 8:33 AM

## 2015-12-20 NOTE — Progress Notes (Signed)
Physical Therapy Treatment Patient Details Name: Calvin HawkingJames Diaz MRN: [id number] DOB: 07/19/1940 Today's Date: 12/20/2015    History of Present Illness Calvin LowensteinJames Theirseis an 75 y.o.male(per chart) medical history significant of dementia, HTn, a fib, HLD. Per family who was at the bedside initially but not present duringmy exam patient has been declining in health 2 weeks.MRI of brain showed acute on subacute infarct on the Right.    PT Comments    Pt performed increased activity as evident by tolerating gait training.  Pt able to perform majority of activities with +1 assist.  Pt continues to require rehab in post acute setting to address deficits and improve function before returning home.  Will continue to progress patient during acute hospitalization with emphasis on gait and higher level balance activities.   Follow Up Recommendations  CIR;Supervision/Assistance - 24 hour     Equipment Recommendations       Recommendations for Other Services       Precautions / Restrictions Precautions Precautions: Fall Precaution Comments: h/o dementia Restrictions Weight Bearing Restrictions: No    Mobility  Bed Mobility Overal bed mobility: Needs Assistance;+2 for physical assistance Bed Mobility: Supine to Sit;Sit to Supine Rolling: Mod assist;+2 for physical assistance         General bed mobility comments: HOB elevated with use of bed pad to advance LEs to edge of bed.  Pt required assist with B LEs and upper trunk contol.  Pt able to initiate movement of LEs with increased time but ultimatley require assist to advance to edge of bed.  Use of bed pad to advance patient's bottom to edge of bed in prep for transfers.    Transfers Overall transfer level: Needs assistance Equipment used: Rolling walker (2 wheeled) Transfers: Sit to/from Stand Sit to Stand: Mod assist;+2 physical assistance (Initially +2 for safety.  ) Stand pivot transfers: Mod assist       General transfer  comment: Pt remains fearful but able to push with tactile cues for R hand placement.  PTA remains to assist LUE to RW hand grip.  Pt remains to require assist to boost into standing.  For stand to sit patient able to initiate movement for reaching with RUE back for recliner chair.  Pt remains to present with poor eccentric loading.    Ambulation/Gait Ambulation/Gait assistance: +2 physical assistance;Min assist (then progressed to min +1/mod +1.  ) Ambulation Distance (Feet): 110 Feet Assistive device: Rolling walker (2 wheeled) Gait Pattern/deviations: Step-through pattern;Trunk flexed;Narrow base of support;Decreased stride length;Drifts right/left (Lateral lean to the R )   Gait velocity interpretation: Below normal speed for age/gender General Gait Details: Pt with L neglect in halls requiring assist to negotiate obstacles in halls.  Pt required cues for orientation as he kept wanting to visit other rooms he passed.  Pt drifting to the R with cues to correct RW position.  Pt required constanct cues for forward gaze and PTA encourage intermittent gaze to the L.     Stairs            Wheelchair Mobility    Modified Rankin (Stroke Patients Only) Modified Rankin (Stroke Patients Only) Pre-Morbid Rankin Score: Severe disability Modified Rankin: Moderately severe disability     Balance     Sitting balance-Leahy Scale: Poor Sitting balance - Comments: Required min assist to maintain balance with intermittent unassisted sitting balance.       Standing balance-Leahy Scale: Poor Standing balance comment: Requires reliance of B UE support on RW.  Pt  let go of RW in hall to use a tissue and presents with heavy lean to the R.                      Cognition Arousal/Alertness: Awake/alert Behavior During Therapy: WFL for tasks assessed/performed Overall Cognitive Status: Impaired/Different from baseline Area of Impairment: Attention;Following commands;Safety/judgement;Problem  solving   Current Attention Level: Focused   Following Commands: Follows one step commands with increased time Safety/Judgement: Decreased awareness of safety;Decreased awareness of deficits Awareness: Intellectual Problem Solving: Slow processing;Decreased initiation;Difficulty sequencing;Requires verbal cues;Requires tactile cues General Comments: pt with L sided neglect    Exercises      General Comments        Pertinent Vitals/Pain Pain Assessment: Faces Faces Pain Scale: Hurts little more Pain Location: L arm with movement Pain Descriptors / Indicators: Grimacing;Guarding;Sore Pain Intervention(s): Monitored during session;Repositioned    Home Living                      Prior Function            PT Goals (current goals can now be found in the care plan section) Acute Rehab PT Goals Patient Stated Goal: didn't state Potential to Achieve Goals: Good Progress towards PT goals: Progressing toward goals    Frequency    Min 3X/week      PT Plan Current plan remains appropriate    Co-evaluation             End of Session Equipment Utilized During Treatment: Gait belt Activity Tolerance: Patient limited by pain;Patient limited by fatigue Patient left: in chair;with call bell/phone within reach;with chair alarm set     Time: [id number] PT Time Calculation (min) (ACUTE ONLY): 23 min  Charges:  $Gait Training: 8-22 mins $Therapeutic Activity: 8-22 mins                    G Codes:      Florestine Aversimee J Conda Wannamaker 12/20/2015, 2:07 PM

## 2015-12-21 ENCOUNTER — Telehealth: Payer: Self-pay | Admitting: Family Medicine

## 2015-12-21 DIAGNOSIS — I6789 Other cerebrovascular disease: Secondary | ICD-10-CM | POA: Diagnosis not present

## 2015-12-21 DIAGNOSIS — R1312 Dysphagia, oropharyngeal phase: Secondary | ICD-10-CM | POA: Diagnosis not present

## 2015-12-21 DIAGNOSIS — D62 Acute posthemorrhagic anemia: Secondary | ICD-10-CM | POA: Diagnosis not present

## 2015-12-21 DIAGNOSIS — M25522 Pain in left elbow: Secondary | ICD-10-CM | POA: Diagnosis not present

## 2015-12-21 DIAGNOSIS — I10 Essential (primary) hypertension: Secondary | ICD-10-CM | POA: Diagnosis not present

## 2015-12-21 DIAGNOSIS — M6281 Muscle weakness (generalized): Secondary | ICD-10-CM | POA: Diagnosis not present

## 2015-12-21 DIAGNOSIS — F0391 Unspecified dementia with behavioral disturbance: Secondary | ICD-10-CM | POA: Diagnosis not present

## 2015-12-21 DIAGNOSIS — I4891 Unspecified atrial fibrillation: Secondary | ICD-10-CM | POA: Diagnosis not present

## 2015-12-21 DIAGNOSIS — M25512 Pain in left shoulder: Secondary | ICD-10-CM | POA: Diagnosis not present

## 2015-12-21 DIAGNOSIS — G4700 Insomnia, unspecified: Secondary | ICD-10-CM | POA: Diagnosis not present

## 2015-12-21 DIAGNOSIS — R1310 Dysphagia, unspecified: Secondary | ICD-10-CM | POA: Diagnosis not present

## 2015-12-21 DIAGNOSIS — M130 Polyarthritis, unspecified: Secondary | ICD-10-CM | POA: Diagnosis not present

## 2015-12-21 DIAGNOSIS — R41841 Cognitive communication deficit: Secondary | ICD-10-CM | POA: Diagnosis not present

## 2015-12-21 DIAGNOSIS — R2689 Other abnormalities of gait and mobility: Secondary | ICD-10-CM | POA: Diagnosis not present

## 2015-12-21 DIAGNOSIS — M1990 Unspecified osteoarthritis, unspecified site: Secondary | ICD-10-CM | POA: Diagnosis not present

## 2015-12-21 DIAGNOSIS — F0390 Unspecified dementia without behavioral disturbance: Secondary | ICD-10-CM | POA: Diagnosis not present

## 2015-12-21 DIAGNOSIS — E785 Hyperlipidemia, unspecified: Secondary | ICD-10-CM | POA: Diagnosis not present

## 2015-12-21 DIAGNOSIS — Z5181 Encounter for therapeutic drug level monitoring: Secondary | ICD-10-CM | POA: Diagnosis not present

## 2015-12-21 DIAGNOSIS — I639 Cerebral infarction, unspecified: Secondary | ICD-10-CM | POA: Diagnosis not present

## 2015-12-21 DIAGNOSIS — M300 Polyarteritis nodosa: Secondary | ICD-10-CM | POA: Diagnosis not present

## 2015-12-21 DIAGNOSIS — I69359 Hemiplegia and hemiparesis following cerebral infarction affecting unspecified side: Secondary | ICD-10-CM | POA: Diagnosis not present

## 2015-12-21 DIAGNOSIS — R4182 Altered mental status, unspecified: Secondary | ICD-10-CM | POA: Diagnosis not present

## 2015-12-21 LAB — CBC WITH DIFFERENTIAL/PLATELET
BASOS ABS: 0 10*3/uL (ref 0.0–0.1)
BASOS PCT: 0 %
EOS PCT: 0 %
Eosinophils Absolute: 0 10*3/uL (ref 0.0–0.7)
HCT: 34.5 % — ABNORMAL LOW (ref 39.0–52.0)
Hemoglobin: 11.8 g/dL — ABNORMAL LOW (ref 13.0–17.0)
Lymphocytes Relative: 20 %
Lymphs Abs: 2.9 10*3/uL (ref 0.7–4.0)
MCH: 29.4 pg (ref 26.0–34.0)
MCHC: 34.2 g/dL (ref 30.0–36.0)
MCV: 85.8 fL (ref 78.0–100.0)
MONO ABS: 1.2 10*3/uL — AB (ref 0.1–1.0)
Monocytes Relative: 8 %
Neutro Abs: 10.3 10*3/uL — ABNORMAL HIGH (ref 1.7–7.7)
Neutrophils Relative %: 72 %
PLATELETS: 680 10*3/uL — AB (ref 150–400)
RBC: 4.02 MIL/uL — ABNORMAL LOW (ref 4.22–5.81)
RDW: 13.1 % (ref 11.5–15.5)
WBC: 14.4 10*3/uL — ABNORMAL HIGH (ref 4.0–10.5)

## 2015-12-21 LAB — ANTINUCLEAR ANTIBODIES, IFA: ANA Ab, IFA: NEGATIVE

## 2015-12-21 MED ORDER — PREDNISONE 10 MG PO TABS: ORAL_TABLET | ORAL | 0 refills | Status: DC

## 2015-12-21 MED ORDER — COLCHICINE 0.6 MG PO TABS: 0.6000 mg | ORAL_TABLET | Freq: Every day | ORAL | 0 refills | Status: DC

## 2015-12-21 NOTE — Discharge Summary (Signed)
Physician Discharge Summary  Calvin Diaz ZOX:[id number] DOB: 01/14/1941 DOA: 12/13/2015  PCP: Carollee Herter, MD  Admit date: 12/13/2015 Discharge date: 12/21/2015  Admitted From: Home Disposition: SNF  Recommendations for Outpatient Follow-up:  1. Follow up with PCP in 1 week 2. Please obtain CBC in one week to see resolution of leukocytosis  3. Please follow up on the following pending results: ANA, culture results from left elbow synovial fluid  4. Radiology recommends to repeat CT in 6 months for patchy apical lung opacities seen on CT angiogram of the neck  Home Health: No  Equipment/Devices: None   Discharge Condition: Stable CODE STATUS: DNR  Diet recommendation: heart healthy  Brief/Interim Summary: From H&P: Patient is a 75 y.o.malerecent CVA, atrial fibrillation on anticoagulation who presented to the ED with 2-3 week history of waxing and waning confusion, intermittent slurred speech, painful/swollen left elbow and left shoulder/ankle. Patient also had one episode of fever in the emergency room. Patient was initially admitted for concerns of stroke. MRI brain was completed which showed small area of acute ischemia superimposed on remote right MCA infarct along the right postcentral gyrus. Neurology was consulted as well as physical therapy, occupational therapy, SLP. Patient was afebrile throughout admission; however, with his continued numerous joint pain, gout or other inflammatory arthritis was considered. Orthopedic surgery was also consulted. Patient was empirically treated with prednisone and colchicine for gout. He had significant left elbow effusion that was seen on MRI. IR was consulted for arthrocentesis, which was negative for septic joint and did not have any crystals on examination, as well. Final culture result from synovial fluid is pending at time of discharge. Rheumatoid factor was also obtained which was negative. ANA is pending at time of discharge.  Patient's symptoms continued to improve with empiric prednisone and colchicine and this will be continued at time of discharge. Patient will be discharged to skilled nursing facility for continued therapy.  Discharge Diagnoses:  Principal Problem:   Polyarticular arthritis Active Problems:   History of CVA with residual deficit   Hyperlipidemia   Essential hypertension   Atrial fibrillation (HCC)   Dementia   Fever   CVA (cerebral vascular accident) (HCC)   Hypokalemia   Dysphagia, post-stroke   Tachycardia   Leukocytosis   Acute blood loss anemia   Swelling   Delirium   Elbow swelling, left   Left elbow pain   Pressure injury of skin   Fever with Polyarticular arthritis (Left elbow, shoulder and ankle):Had one episode of fever on admission-none since then. Seen by orthopedics, who thought patient did not have enough effusion for an arthrocentesis, recommendations were to start anti-inflammatoryagents.Patient was subsequently started on steroids and colchicinefor presumed gouty arthritis. Although he has left ankle and left shoulder swelling and pain improved, his left elbow swellingand tenderness is still persistent. Very difficult exam as patient appears to have some amount of contracture in his left upper extremity from a prior CVA. A MRI ofthe left elbow area confirms a large effusion, case was discussed with orthopedics on call-Dr. Lucrezia Europe recommended arthrocentesis by radiology.After discussion with radiology on 11/25-Xarelto was placed on hold. Arthrocentesis by IR on 11/27, fluid studies pending, but WBC 200 without crystals. For now continue with steroids and colcichine. Blood cultures continued to be negative, uric acid levels not elevated.RA factor negative and ANApending.  Acute WJX:BJYN historian-he does have some amount of dementia at baseline-not sure if he is unable to move his left side due to pain from polyarticular arthritis or  he has had another  small CVA. MRI brain shows a possible small acute CVA in the right parietal lobe. Stroke team consulted -continue Xarelto for now. 2-D echocardiogram without any embolic source, CT angiogram of the head and neck without any major stenosis.   Recent history of CVA in July 2017:Likely embolic given history of atrial fibrillation- Continue anticoagulation with Xarelto  Dyslipidemia: LDL 57-continue statin  Persistent Atrial fibrillation:Back in sinus rhythm-Not on any rate control medications, continue Xarelto.CHADSVASC score of 4. Was seen by cardiology as an outpatient on 11/15-planswere forr DCCV-after 4 weeks of uninterrupted Xarelto therapy-probably not needed as patient has converted to sinus rhythm spontaneously.   Hypertension:Controlled, continue amlodipine and Terazosin  Dementia with mild delirium:Continue Namenda.  Chronic insomnia:Resume belsomra as outpatient  Patchy apical lung opacities:Seen on CT angiogram of the neck, no obvious infiltrates seen on chest x-ray. Radiology recommends to repeat CT in 6 months.     Discharge Instructions  Discharge Instructions    Diet - low sodium heart healthy    Complete by:  As directed    Increase activity slowly    Complete by:  As directed        Medication List    STOP taking these medications   Suvorexant 10 MG Tabs Commonly known as:  BELSOMRA     TAKE these medications   amLODipine 10 MG tablet Commonly known as:  NORVASC Take 1 tablet (10 mg total) by mouth daily.   colchicine 0.6 MG tablet Take 1 tablet (0.6 mg total) by mouth daily.   memantine 5 MG tablet Commonly known as:  NAMENDA Take 2 tablets (10 mg total) by mouth daily.   multivitamin with minerals Tabs tablet Take 1 tablet by mouth daily.   pravastatin 80 MG tablet Commonly known as:  PRAVACHOL Take 1 tablet (80 mg total) by mouth at bedtime.   predniSONE 10 MG tablet Commonly known as:  DELTASONE Take 4 tabs for 3 days, then 3  tabs for 3 days, then 2 tabs for 3 days, then 1 tab for 3 days, then 1/2 tab for 4 days.   rivaroxaban 20 MG Tabs tablet Commonly known as:  XARELTO Take 1 tablet (20 mg total) by mouth daily with supper.   terazosin 1 MG capsule Commonly known as:  HYTRIN Take 1 capsule (1 mg total) by mouth at bedtime.      Follow-up Information    Carollee Herter, MD. Schedule an appointment as soon as possible for a visit in 1 week(s).   Specialty:  Family Medicine Contact information: [address] [place] [address] [phone]          No Known Allergies  Consultations:  Neurology  Orthopedics    Procedures/Studies: Ct Angio Head W Or Wo Contrast  Result Date: 12/15/2015 CLINICAL DATA:  History of dementia. Slurred speech and decreased movement in the left arm. EXAM: CT ANGIOGRAPHY HEAD AND NECK TECHNIQUE: Multidetector CT imaging of the head and neck was performed using the standard protocol during bolus administration of intravenous contrast. Multiplanar CT image reconstructions and MIPs were obtained to evaluate the vascular anatomy. Carotid stenosis measurements (when applicable) are obtained utilizing NASCET criteria, using the distal internal carotid diameter as the denominator. CONTRAST:  50 cc Isovue 370 intravenous COMPARISON:  Brain MRI from yesterday FINDINGS: CT HEAD FINDINGS Brain: Acute on chronic ischemia in the right MCA territory is poorly seen by CT. No acute hemorrhage. Remote left MCA territory artifact involving large portion of the lateral  left frontal lobe atrophy with ventriculomegaly. Vascular: Extensive atherosclerotic calcification. Skull: Negative Sinuses: Negative Orbits: Negative Review of the MIP images confirms the above findings CTA NECK FINDINGS Aortic arch: Extensive atherosclerotic plaque. No aneurysm or dissection. Right carotid system: Atherosclerotic plaque predominately at the carotid bifurcation and in the proximal ICA with no flow  limiting stenosis of 50% or greater. No ulceration or dissection. Second mixed density plaque in the distal ICA at the skullbase without stenosis. No vessel beading. Left carotid system: Mild ostial narrowing due to mixed density plaque. Predominantly calcified plaque at the carotid bifurcation without ICA stenosis. Prominent calcified plaque in the ICA at the skullbase without flow limiting stenosis. No ulceration or dissection. Vertebral arteries: No proximal flow limiting stenosis in the subclavian arteries. Moderate proximal left vertebral artery stenosis. Mild right ostial stenosis at calcified plaque. No evidence of dissection. Skeleton: No acute or aggressive finding. Advanced cervical disc degeneration. Other neck: No incidental mass or adenopathy. Upper chest: There are spiculated reticular nodular densities at the apices, some containing central calcification at the right apex. This calcification favors postinfectious scarring. Need correlation for acute infectious symptoms the malignancy history. Review of the MIP images confirms the above findings CTA HEAD FINDINGS Anterior circulation: Extensive calcified plaque on the carotid siphons. Assessment of luminal narrowing is limited by blooming calcium, no flow limiting stenosis suspected. No reversible flow limiting stenosis in the proximal circulation. Negative for aneurysm. Complete circle-of-Willis. Posterior circulation: Symmetric vertebral arteries. Smooth and widely patent vertebral and basilar arteries. No major branch occlusion or flow limiting stenosis. Venous sinuses: Patent as permitted by contrast timing Anatomic variants: None, Delayed phase: No parenchymal enhancement or mass Review of the MIP images confirms the above findings IMPRESSION: 1. No acute arterial finding. No embolic source or flow limiting stenosis in the right anterior circulation to explain acute infarcts. 2. Moderate left V1 segment stenosis. 3. Advanced chronic ischemic  injury which obscures the acute right MCA territory infarct by CT. No hemorrhagic conversion or evolution since brain MRI yesterday. 4. Patchy apical lung opacities that are nonspecific. Some contain central calcifications on the right, favoring remote infection. Recommend chest x-ray correlation to ensure no acute opacities compared to recent priors. If clinically appropriate, chest CT in 6 months could follow-up the small nodular areas. Electronically Signed   By: Marnee Spring M.D.   On: 12/15/2015 17:00   Dg Chest 2 View  Result Date: 12/07/2015 CLINICAL DATA:  Cough for 2 weeks, hypertension. EXAM: CHEST  2 VIEW COMPARISON:  09/06/2015 FINDINGS: There is hyperinflation of the lungs compatible with COPD. Heart and mediastinal contours are within normal limits. No focal opacities or effusions. No acute bony abnormality. IMPRESSION: COPD.  No active disease. Electronically Signed   By: Charlett Nose M.D.   On: 12/07/2015 16:06   Dg Elbow 2 Views Left  Result Date: 12/15/2015 CLINICAL DATA:  Swelling and pain to LEFT shoulder, LEFT ankle on LEFT elbow today, no known injury EXAM: LEFT ELBOW - 2 VIEW COMPARISON:  None FINDINGS: Mild osseous demineralization. Joint spaces preserved. Small elbow joint effusion. No acute fracture, dislocation, or bone destruction. Question soft tissue swelling and elbow region. IMPRESSION: Small joint effusion. No acute osseous abnormalities otherwise identified. Electronically Signed   By: Ulyses Southward M.D.   On: 12/15/2015 17:15   Dg Ankle 2 Views Left  Result Date: 12/15/2015 CLINICAL DATA:  Swelling and pain to LEFT shoulder, LEFT ankle on LEFT elbow today, no known injury EXAM: LEFT ANKLE -  2 VIEW COMPARISON:  None FINDINGS: Osseous demineralization. Joint spaces preserved. Mild soft tissue swelling greatest laterally. Superimposed artifacts at calcaneus on lateral view. No acute fracture, dislocation, or bone destruction. IMPRESSION: No acute osseous abnormalities.  Electronically Signed   By: Ulyses Southward M.D.   On: 12/15/2015 17:15   Ct Head Wo Contrast  Result Date: 12/07/2015 CLINICAL DATA:  LEFT side pain weakness for 3 months, history of stroke on 06/23/2015, dementia, hypertension, former smoker EXAM: CT HEAD WITHOUT CONTRAST TECHNIQUE: Contiguous axial images were obtained from the base of the skull through the vertex without intravenous contrast. COMPARISON:  Non FINDINGS: Brain: Generalized atrophy. Mild ex vacuo dilatation of the ventricular system. No midline shift or mass effect. LEFT frontal infarct. Larger RIGHT frontal and parietal infarcts. BILATERAL frontal infarcts appear old. RIGHT parietal infarct is likely subacute to old. No intracranial hemorrhage, mass lesion or extra-axial fluid collection. Vascular: Scattered atherosclerotic calcifications at the carotid siphons bilaterally Skull: Intact Sinuses/Orbits: Clear Other: N/A IMPRESSION: Atrophy with small vessel chronic ischemic changes of deep cerebral white matter. LEFT frontal and RIGHT frontal/parietal infarcts as above without intracranial hemorrhage. Electronically Signed   By: Ulyses Southward M.D.   On: 12/07/2015 14:22   Ct Angio Neck W Or Wo Contrast  Result Date: 12/15/2015 CLINICAL DATA:  History of dementia. Slurred speech and decreased movement in the left arm. EXAM: CT ANGIOGRAPHY HEAD AND NECK TECHNIQUE: Multidetector CT imaging of the head and neck was performed using the standard protocol during bolus administration of intravenous contrast. Multiplanar CT image reconstructions and MIPs were obtained to evaluate the vascular anatomy. Carotid stenosis measurements (when applicable) are obtained utilizing NASCET criteria, using the distal internal carotid diameter as the denominator. CONTRAST:  50 cc Isovue 370 intravenous COMPARISON:  Brain MRI from yesterday FINDINGS: CT HEAD FINDINGS Brain: Acute on chronic ischemia in the right MCA territory is poorly seen by CT. No acute hemorrhage.  Remote left MCA territory artifact involving large portion of the lateral left frontal lobe atrophy with ventriculomegaly. Vascular: Extensive atherosclerotic calcification. Skull: Negative Sinuses: Negative Orbits: Negative Review of the MIP images confirms the above findings CTA NECK FINDINGS Aortic arch: Extensive atherosclerotic plaque. No aneurysm or dissection. Right carotid system: Atherosclerotic plaque predominately at the carotid bifurcation and in the proximal ICA with no flow limiting stenosis of 50% or greater. No ulceration or dissection. Second mixed density plaque in the distal ICA at the skullbase without stenosis. No vessel beading. Left carotid system: Mild ostial narrowing due to mixed density plaque. Predominantly calcified plaque at the carotid bifurcation without ICA stenosis. Prominent calcified plaque in the ICA at the skullbase without flow limiting stenosis. No ulceration or dissection. Vertebral arteries: No proximal flow limiting stenosis in the subclavian arteries. Moderate proximal left vertebral artery stenosis. Mild right ostial stenosis at calcified plaque. No evidence of dissection. Skeleton: No acute or aggressive finding. Advanced cervical disc degeneration. Other neck: No incidental mass or adenopathy. Upper chest: There are spiculated reticular nodular densities at the apices, some containing central calcification at the right apex. This calcification favors postinfectious scarring. Need correlation for acute infectious symptoms the malignancy history. Review of the MIP images confirms the above findings CTA HEAD FINDINGS Anterior circulation: Extensive calcified plaque on the carotid siphons. Assessment of luminal narrowing is limited by blooming calcium, no flow limiting stenosis suspected. No reversible flow limiting stenosis in the proximal circulation. Negative for aneurysm. Complete circle-of-Willis. Posterior circulation: Symmetric vertebral arteries. Smooth and widely  patent vertebral and  basilar arteries. No major branch occlusion or flow limiting stenosis. Venous sinuses: Patent as permitted by contrast timing Anatomic variants: None, Delayed phase: No parenchymal enhancement or mass Review of the MIP images confirms the above findings IMPRESSION: 1. No acute arterial finding. No embolic source or flow limiting stenosis in the right anterior circulation to explain acute infarcts. 2. Moderate left V1 segment stenosis. 3. Advanced chronic ischemic injury which obscures the acute right MCA territory infarct by CT. No hemorrhagic conversion or evolution since brain MRI yesterday. 4. Patchy apical lung opacities that are nonspecific. Some contain central calcifications on the right, favoring remote infection. Recommend chest x-ray correlation to ensure no acute opacities compared to recent priors. If clinically appropriate, chest CT in 6 months could follow-up the small nodular areas. Electronically Signed   By: Marnee Spring M.D.   On: 12/15/2015 17:00   Mr Brain Wo Contrast  Result Date: 12/14/2015 CLINICAL DATA:  Declining health for 2 weeks. Difficulty ambulating. Slurred speech. Decreased movement in left arm. EXAM: MRI HEAD WITHOUT CONTRAST TECHNIQUE: Multiplanar, multiecho pulse sequences of the brain and surrounding structures were obtained without intravenous contrast. COMPARISON:  Head CT 12/07/2015 FINDINGS: Despite efforts by the technologist and patient, motion artifact is present on today's examination and could not be eliminated. This reduces the sensitivity and specificity of the study. There is a small area of diffusion restriction within the right parietal lobe, which is in the region of encephalomalacia associated with prior large right MCA infarct. There is also extensive left frontal encephalomalacia and right occipital encephalomalacia. No midline shift or other significant mass effect. There is ex vacuo dilatation of the ventricles. No extra-axial  collection. IMPRESSION: 1. Severely motion degraded examination. 2. Within the above limitation, there is a small area of acute ischemia superimposed on the area of encephalomalacia associated with the remote right MCA infarct. This is along the right postcentral gyrus. 3. Large areas of encephalomalacia in the right MCA territory and left frontal lobe. Electronically Signed   By: Deatra Robinson M.D.   On: 12/14/2015 05:26   Mr Elbow Left W Wo Contrast  Result Date: 12/18/2015 CLINICAL DATA:  Painful left elbow.  Frozen joint.  Swelling. EXAM: MRI OF THE LEFT ELBOW WITHOUT AND WITH CONTRAST TECHNIQUE: Multiplanar, multisequence MR imaging of the elbow was performed before and after the administration of intravenous contrast. CONTRAST:  10mL MULTIHANCE GADOBENATE DIMEGLUMINE 529 MG/ML IV SOLN COMPARISON:  12/15/2015 FINDINGS: The elbow was imaged in 60 degrees of flexion which reduces diagnostic sensitivity and specificity. The patient was unable to fully extend the elbow for imaging. TENDONS Common forearm flexor origin: Unremarkable Common forearm extensor origin: Unremarkable Biceps: Unremarkable Triceps: Mild tendinopathy. LIGAMENTS Medial stabilizers: Unremarkable Lateral stabilizers: Lateral ulnar collateral ligament suboptimally seen but probably intact. Radial collateral ligament appears intact. Cartilage: Mild degenerative chondral thinning. Joint: Joint effusion with enhancing margins and synovitis especially posteriorly. Posteriorly there appears to be extension of abnormal fluid with enhancing margins up into the triceps muscle for example on image 10/14. Cubital tunnel: Enhancing tissue likely representing synovitis from the joint extends towards the proximal margin of the cubital tunnel adjacent to the median nerve as on image 11/12. There is some accentuated increased T2 signal in the median nerve on image 16/8 with potential associated nerve enhancement on images 17-20 of series 12. Bones:  Low-grade marrow edema in the ulna at the insertion site of the triceps. No significant associated degree of enhancement in the bone. Musculature and soft tissues:  There is abnormal edema signal tracking in the distal triceps musculature. Extensive subcutaneous edema with some low-level enhancement and considerable thickening of the subcutaneous tissues in the medial antecubital region. There is also dorsal subcutaneous edema and low-grade enhancement along the elbow. IMPRESSION: 1. Large elbow joint effusion with synovitis. Posteriorly, there appears to be an extension of this effusion dissecting into the triceps muscle with thick enhancing margins and associated abnormal enhancement within the triceps muscle distally, for example image 11/14. Although a sterile synovitis due to inflammatory arthropathy is possible, septic joint is not excluded. There is no overt bony edema to further suggest osteomyelitis. 2. Some of the posterior extension of fluid and inflammation extends near the median nerve in knee cubital tunnel, potentially with some secondary mild inflammation of the median nerve. 3. Extensive subcutaneous edema around the elbow most prominent along the medial antecubital region but extending into the upper arm and forearm as well. Electronically Signed   By: Gaylyn RongWalter  Liebkemann M.D.   On: 12/18/2015 08:57   Dg Chest Port 1 View  Result Date: 12/13/2015 CLINICAL DATA:  75 year old male with shortness of breath and confusion EXAM: PORTABLE CHEST 1 VIEW COMPARISON:  Prior chest x-ray 12/07/2015 FINDINGS: Cardiac and mediastinal contours remain unchanged. Atherosclerotic calcifications present in the thoracic aorta. No pulmonary edema, focal airspace consolidation, large pleural effusion or pneumothorax. Hyperinflation and central bronchitic changes are similar compared to prior. No acute osseous abnormality. IMPRESSION: 1. No active cardiopulmonary disease. 2.  Aortic Atherosclerosis (ICD10-170.0) 3.  Hyperinflation and central bronchitic changes suggest COPD. Electronically Signed   By: Malachy MoanHeath  McCullough M.D.   On: 12/13/2015 16:18   Dg Shoulder Left  Result Date: 12/15/2015 CLINICAL DATA:  Swelling and pain to LEFT shoulder, LEFT ankle on LEFT elbow today, no known injury EXAM: LEFT SHOULDER - 2+ VIEW COMPARISON:  None FINDINGS: Osseous demineralization. AC joint alignment normal. No acute fracture, dislocation, or bone destruction. Visualized LEFT ribs intact. IMPRESSION: No acute osseous abnormalities. Electronically Signed   By: Ulyses SouthwardMark  Boles M.D.   On: 12/15/2015 17:16   Dg Swallowing Func-speech Pathology  Result Date: 12/14/2015 Objective Swallowing Evaluation: Type of Study: MBS-Modified Barium Swallow Study Patient Details Name: Jeani HawkingJames Vendetti MRN: [id number] Date of Birth: July 25, 1940 Today's Date: 12/14/2015 Time: SLP Start Time (ACUTE ONLY): 1030-SLP Stop Time (ACUTE ONLY): 1045 SLP Time Calculation (min) (ACUTE ONLY): 15 min Past Medical History: Past Medical History: Diagnosis Date . Dementia  . High cholesterol  . Hypertension  . Myocardial infarction  . Stroke [facility](HCC)  Past Surgical History: Past Surgical History: Procedure Laterality Date . CORONARY ANGIOPLASTY   HPI: [name] a 75 y.o.malewith medical history significant of dementia, HTn, a fib, HLD, prior right CVA with residual deficits, details not provided. Per family upon admission, patient has been declining in health 2 weeks. Patient normally can ambulate by himself but since Friday he has been unable to do so. He also now has slurred speech as well as decreased movement in his left arm. Found to have a rectal temp of 101.7, he had an increase in his white blood cell count. Chest x-ray was negative for pneumonia, UA was negative for infection,blood cultures have been ordered 2. MRI shows new infarct in right parietal lobe, in same area as prior CVA.  No Data Recorded Assessment / Plan / Recommendation CHL IP CLINICAL  IMPRESSIONS 12/14/2015 Therapy Diagnosis Moderate oral phase dysphagia;Moderate pharyngeal phase dysphagia Clinical Impression Pt demosntrates moderate dysphagia, mostly attributable to cognitive impariment.   Mastication  is prolonged, verbal cues needed for transit. Pt orally holds all boluses and struggles to initaite swallow response. Verbal cues and visual cues to give next bolus aid in swallow initiation. Regardless, all boluses reach the pyriform sinuses prior to intiation. WIth upright posture there is no penetration or aspiration observed, no residual. Pt is recommended to consume a dys 2/thin liquid diet with full supervision for verbal cueing and assist. Pt is at risk for aspiration if posture is suboptimal and also at risk for poor nutrition/hydration given lack of initiation. Will follow for swallowing and cognitive therapy.    Impact on safety and function Moderate aspiration risk;Risk for inadequate nutrition/hydration   [facility] IP TREATMENT RECOMMENDATION 12/14/2015 Treatment Recommendations Therapy as outlined in treatment plan below   Prognosis 12/14/2015 Prognosis for Safe Diet Advancement Fair Barriers to Reach Goals Cognitive deficits Barriers/Prognosis Comment -- [facility] IP DIET RECOMMENDATION 12/14/2015 SLP Diet Recommendations Dysphagia 2 (Fine chop) solids;Thin liquid Liquid Administration via Cup;Straw Medication Administration Crushed with puree Compensations Minimize environmental distractions;Other (Comment) Postural Changes Seated upright at 90 degrees   CHL IP OTHER RECOMMENDATIONS 12/14/2015 Recommended Consults -- Oral Care Recommendations Oral care BID Other Recommendations Have oral suction available   [facility] IP FOLLOW UP RECOMMENDATIONS 12/14/2015 Follow up Recommendations Inpatient Rehab   CHL IP FREQUENCY AND DURATION 12/14/2015 Speech Therapy Frequency (ACUTE ONLY) min 2x/week Treatment Duration 2 weeks      CH[facility] IP ORAL PHASE 12/14/2015 Oral Phase Impaired Oral - Pudding Teaspoon -- Oral -  Pudding Cup -- Oral - Honey Teaspoon -- Oral - Honey Cup -- Oral - Nectar Teaspoon -- Oral - Nectar Cup -- Oral - Nectar Straw -- Oral - Thin Teaspoon -- Oral - Thin Cup Holding of bolus;Delayed oral transit Oral - Thin Straw Holding of bolus;Delayed oral transit Oral - Puree Delayed oral transit;Holding of bolus Oral - Mech Soft -- Oral - Regular Holding of bolus;Reduced posterior propulsion;Decreased bolus cohesion;Delayed oral transit Oral - Multi-Consistency -- Oral - Pill Decreased bolus cohesion;Delayed oral transit;Reduced posterior propulsion;Holding of bolus;Other (Comment) Oral Phase - Comment --  CHL IP PHARYNGEAL PHASE 12/14/2015 Pharyngeal Phase Impaired Pharyngeal- Pudding Teaspoon -- Pharyngeal -- Pharyngeal- Pudding Cup -- Pharyngeal -- Pharyngeal- Honey Teaspoon -- Pharyngeal -- Pharyngeal- Honey Cup -- Pharyngeal -- Pharyngeal- Nectar Teaspoon -- Pharyngeal -- Pharyngeal- Nectar Cup -- Pharyngeal -- Pharyngeal- Nectar Straw -- Pharyngeal -- Pharyngeal- Thin Teaspoon -- Pharyngeal -- Pharyngeal- Thin Cup Delayed swallow initiation-pyriform sinuses Pharyngeal -- Pharyngeal- Thin Straw Delayed swallow initiation-pyriform sinuses Pharyngeal -- Pharyngeal- Puree Delayed swallow initiation-pyriform sinuses Pharyngeal -- Pharyngeal- Mechanical Soft -- Pharyngeal -- Pharyngeal- Regular Delayed swallow initiation-pyriform sinuses Pharyngeal -- Pharyngeal- Multi-consistency -- Pharyngeal -- Pharyngeal- Pill -- Pharyngeal -- Pharyngeal Comment --  No flowsheet data found. No flowsheet data found. Harlon DittyBonnie DeBlois, MA CCC-SLP [phone] [name], Bonnie Caroline 12/14/2015, 11:09 AM              Dg Horace PorteousFluoro Guided Needle Plc Aspiration/injection Loc  Result Date: 12/20/2015 CLINICAL DATA:  Left elbow joint effusion. Fever. Rule out infection or gout. EXAM: LEFT ELBOW ASPIRATION UNDER FLUOROSCOPY FLUOROSCOPY TIME:  Fluoroscopy Time:  0 minutes 24 seconds Radiation Exposure Index (if provided by the fluoroscopic  device): Number of Acquired Spot Images: 0 PROCEDURE: Informed consent was obtained from the patient's wife via telephone. Time-out procedure performed prior to the joint aspiration Patient placed supine on the fluoroscopic table. With the left elbow at 90 degrees, of the left elbow joint was localized with fluoroscopy in the lateral  projection. 2% lidocaine was used for local anesthesia. Routine skin prep with Betadine. 21 gauge needle was placed into the elbow joint with aspiration of clear yellow joint fluid. 5 mL of joint fluid was obtained for laboratory studies. There were no complications. Patient tolerated procedure well. IMPRESSION: Successful left elbow aspiration. Electronically Signed   By: Marlan Palau M.D.   On: 12/20/2015 12:51      Discharge Exam: Vitals:   12/20/15 2153 12/21/15 0438  BP: 126/70 138/89  Pulse: 81 81  Resp:  18  Temp: 98.1 F (36.7 C) 97.7 F (36.5 C)   Vitals:   12/19/15 2135 12/20/15 0547 12/20/15 2153 12/21/15 0438  BP: 129/84 140/77 126/70 138/89  Pulse: 85 82 81 81  Resp:  20  18  Temp: 98.3 F (36.8 C) 98.5 F (36.9 C) 98.1 F (36.7 C) 97.7 F (36.5 C)  TempSrc: Oral  Oral Oral  SpO2: 99% 100% 100% 100%  Weight:      Height:       General exam: Appears calm and comfortable, remains confused  Respiratory system: Clear to auscultation. Respiratory effort normal. Cardiovascular system: S1 & S2 heard, RRR. No JVD, murmurs, rubs, gallops or clicks. No pedal edema. Gastrointestinal system: Abdomen is nondistended, soft and nontender. No organomegaly or masses felt. Normal bowel sounds heard. Central nervous system: Alert and oriented. No focal neurological deficits. Extremities: LUE 3/5 in strength, +contracture, LLE 4/5 in strength, RUE and RLE 5/5 in strength, left elbow with some effusion but no erythema     The results of significant diagnostics from this hospitalization (including imaging, microbiology, ancillary and laboratory) are  listed below for reference.     Microbiology: Recent Results (from the past 240 hour(s))  Culture, blood (Routine X 2) w Reflex to ID Panel     Status: None   Collection Time: 12/13/15  5:07 PM  Result Value Ref Range Status   Specimen Description BLOOD LEFT FOREARM  Final   Special Requests BOTTLES DRAWN AEROBIC AND ANAEROBIC 5CC  Final   Culture NO GROWTH 5 DAYS  Final   Report Status 12/18/2015 FINAL  Final  Culture, blood (Routine X 2) w Reflex to ID Panel     Status: None   Collection Time: 12/13/15  5:07 PM  Result Value Ref Range Status   Specimen Description BLOOD RIGHT FOREARM  Final   Special Requests BOTTLES DRAWN AEROBIC AND ANAEROBIC 5CC  Final   Culture NO GROWTH 5 DAYS  Final   Report Status 12/18/2015 FINAL  Final  Body fluid culture     Status: None (Preliminary result)   Collection Time: 12/20/15 12:35 PM  Result Value Ref Range Status   Specimen Description SYNOVIAL LEFT ARM  Final   Special Requests ELBOW  Final   Gram Stain   Final    FEW WBC PRESENT,BOTH PMN AND MONONUCLEAR NO ORGANISMS SEEN    Culture PENDING  Incomplete   Report Status PENDING  Incomplete     Labs: BNP (last 3 results) No results for input(s): BNP in the last 8760 hours. Basic Metabolic Panel:  Recent Labs Lab 12/15/15 0911 12/15/15 1456 12/16/15 0445 12/17/15 0629 12/18/15 0433  NA 136 134* 133* 133* 133*  K 3.2* 3.2* 4.3 3.7 3.6  CL 104 99* 100* 97* 97*  CO2 24 26 24 27 28   GLUCOSE 132* 124* 142* 96 96  BUN 8 9 11 11 16   CREATININE 0.65 0.76 0.77 0.68 0.68  CALCIUM 8.5* 8.7* 8.7*  9.0 9.1  MG 1.8  --   --   --   --    Liver Function Tests: No results for input(s): AST, ALT, ALKPHOS, BILITOT, PROT, ALBUMIN in the last 168 hours. No results for input(s): LIPASE, AMYLASE in the last 168 hours. No results for input(s): AMMONIA in the last 168 hours. CBC:  Recent Labs Lab 12/17/15 0629 12/18/15 0433 12/21/15 0622  WBC 11.1* 12.7* 14.4*  NEUTROABS  --   --  10.3*   HGB 12.0* 11.5* 11.8*  HCT 34.7* 32.7* 34.5*  MCV 87.2 86.5 85.8  PLT 498* 576* 680*   Cardiac Enzymes: No results for input(s): CKTOTAL, CKMB, CKMBINDEX, TROPONINI in the last 168 hours. BNP: Invalid input(s): POCBNP CBG: No results for input(s): GLUCAP in the last 168 hours. D-Dimer No results for input(s): DDIMER in the last 72 hours. Hgb A1c No results for input(s): HGBA1C in the last 72 hours. Lipid Profile No results for input(s): CHOL, HDL, LDLCALC, TRIG, CHOLHDL, LDLDIRECT in the last 72 hours. Thyroid function studies No results for input(s): TSH, T4TOTAL, T3FREE, THYROIDAB in the last 72 hours.  Invalid input(s): FREET3 Anemia work up No results for input(s): VITAMINB12, FOLATE, FERRITIN, TIBC, IRON, RETICCTPCT in the last 72 hours. Urinalysis    Component Value Date/Time   COLORURINE AMBER (A) 12/13/2015 1602   APPEARANCEUR CLEAR 12/13/2015 1602   LABSPEC 1.023 12/13/2015 1602   PHURINE 6.0 12/13/2015 1602   GLUCOSEU NEGATIVE 12/13/2015 1602   HGBUR NEGATIVE 12/13/2015 1602   BILIRUBINUR SMALL (A) 12/13/2015 1602   KETONESUR NEGATIVE 12/13/2015 1602   PROTEINUR 30 (A) 12/13/2015 1602   NITRITE NEGATIVE 12/13/2015 1602   LEUKOCYTESUR NEGATIVE 12/13/2015 1602   Sepsis Labs Invalid input(s): PROCALCITONIN,  WBC,  LACTICIDVEN Microbiology Recent Results (from the past 240 hour(s))  Culture, blood (Routine X 2) w Reflex to ID Panel     Status: None   Collection Time: 12/13/15  5:07 PM  Result Value Ref Range Status   Specimen Description BLOOD LEFT FOREARM  Final   Special Requests BOTTLES DRAWN AEROBIC AND ANAEROBIC 5CC  Final   Culture NO GROWTH 5 DAYS  Final   Report Status 12/18/2015 FINAL  Final  Culture, blood (Routine X 2) w Reflex to ID Panel     Status: None   Collection Time: 12/13/15  5:07 PM  Result Value Ref Range Status   Specimen Description BLOOD RIGHT FOREARM  Final   Special Requests BOTTLES DRAWN AEROBIC AND ANAEROBIC 5CC  Final    Culture NO GROWTH 5 DAYS  Final   Report Status 12/18/2015 FINAL  Final  Body fluid culture     Status: None (Preliminary result)   Collection Time: 12/20/15 12:35 PM  Result Value Ref Range Status   Specimen Description SYNOVIAL LEFT ARM  Final   Special Requests ELBOW  Final   Gram Stain   Final    FEW WBC PRESENT,BOTH PMN AND MONONUCLEAR NO ORGANISMS SEEN    Culture PENDING  Incomplete   Report Status PENDING  Incomplete     Time coordinating discharge: Over 30 minutes  SIGNED:  Noralee Stain, DO [facility] Pager [phone]  If 7PM-7AM, please contact night-coverage [email or web address] Password [email or web address] 12/21/2015, 11:05 AM

## 2015-12-21 NOTE — Telephone Encounter (Signed)
Pt's spouse called to cancel his appt for tomorrow and wanted to let Dr. Susann GivensLalonde know that pt has been in the hospital and will be [facility] for rehab

## 2015-12-21 NOTE — Clinical Social Work Placement (Signed)
   CLINICAL SOCIAL WORK PLACEMENT  NOTE  Date:  12/21/2015  Patient Details  Name: Calvin Diaz MRN: [id number] Date of Birth: 1940/04/03  Clinical Social Work is seeking post-discharge placement for this patient at the Skilled  Nursing Facility level of care (*CSW will initial, date and re-position this form in  chart as items are completed):  Yes   Patient/family provided with [facility] Clinical Social Work Department's list of facilities offering this level of care within the geographic area requested by the patient (or if unable, by the patient's family).  Yes   Patient/family informed of their freedom to choose among providers that offer the needed level of care, that participate in Medicare, Medicaid or managed care program needed by the patient, have an available bed and are willing to accept the patient.      Patient/family informed of [facility]'s ownership interest in [facility] and [facility], as well as of the fact that they are under no obligation to receive care at these facilities.  PASRR submitted to EDS on 12/15/15     PASRR number received on 12/15/15     Existing PASRR number confirmed on       FL2 transmitted to all facilities in geographic area requested by pt/family on 12/15/15     FL2 transmitted to all facilities within larger geographic area on       Patient informed that his/her managed care company has contracts with or will negotiate with certain facilities, including the following:        Yes   Patient/family informed of bed offers received.  [facility]     Physician recommends and patient chooses bed at      [facility] on 12/21/15.  Patient to be transferred to facility by ptar     Patient family notified on 12/21/15 of transfer.  Name of family member notified:  [name]     PHYSICIAN Please sign DNR, Please sign FL2     Additional Comment:     _______________________________________________ Burna SisUris, Tonimarie Gritz H, LCSW 12/21/2015, 12:25 PM

## 2015-12-21 NOTE — Progress Notes (Signed)
Speech Language Pathology Treatment: Dysphagia;Cognitive-Linquistic  Patient Details Name: Calvin Diaz MRN: [id number] DOB: 1940-12-25 Today's Date: 12/21/2015 Time: [id number] SLP Time Calculation (min) (ACUTE ONLY): 18 min  Assessment / Plan / Recommendation Clinical Impression  Skilled treatment session focused on addressing dysphagia and cognition goals.  SLP facilitated session with set-up of washcloth to wash face; patient able to cross midline with Min verbal cues.  Task effective at increasing arousal for PO intake.  Patient required Mod assist cues to initiate self-feeding; however, demonstrated improved ability to self-feed consecutive bites with SLP intervention with verbal and tactile cues to slow pace to allow for oral clearance of advanced Dys.3 textures.  Thin liquid sips use to facilitate oral clearance resulted in oral holding with presentation of next bolus effectively triggering swallow.  Given need for thin liquids sips to completely clear oral cavity and now with oral holding recommend to continue with current diet and continue advanced trails with SLP.       HPI HPI: Calvin Diaz Theirseis a 75 y.o.malewith medical history significant of dementia, HTn, a fib, HLD, prior right CVA with residual deficits, details not provided. Per family upon admission, patient has been declining in health 2 weeks. Patient normally can ambulate by himself but since Friday he has been unable to do so. He also now has slurred speech as well as decreased movement in his left arm. Found to have a rectal temp of 101.7, he had an increase in his white blood cell count. Chest x-ray was negative for pneumonia, UA was negative for infection,blood cultures have been ordered 2. MRI shows new infarct in right parietal lobe, in same area as prior CVA.       SLP Plan  Continue with current plan of care     Recommendations  Diet recommendations: Dysphagia 2 (fine chop);Thin liquid Liquids provided via:  Cup;Straw Medication Administration: Whole meds with liquid Supervision: Full supervision/cueing for compensatory strategies Compensations: Minimize environmental distractions;Small sips/bites;Slow rate;Other (Comment) (use thin liquids sips to assist with swallow initiation ) Postural Changes and/or Swallow Maneuvers: Seated upright 90 degrees                Oral Care Recommendations: Oral care BID Follow up Recommendations: Skilled Nursing facility;24 hour supervision/assistance Plan: Continue with current plan of care       GO              Charlane FerrettiMelissa Jermie Hippe, M.A., CCC-SLP [phone]  [name] 12/21/2015, 1:15 PM

## 2015-12-21 NOTE — Progress Notes (Signed)
Patient will discharge to [facility] Anticipated discharge date: 11/28 Family notified:wife Pharmacist, communityValerie Transportation by [employer]- scheduled for 1:45pm  CSW signing off.  Burna SisJenna H. Phi Avans, LCSW Clinical Social Worker [phone]

## 2015-12-22 ENCOUNTER — Ambulatory Visit: Payer: Medicare Other | Admitting: Family Medicine

## 2015-12-22 DIAGNOSIS — F0390 Unspecified dementia without behavioral disturbance: Secondary | ICD-10-CM | POA: Diagnosis not present

## 2015-12-22 DIAGNOSIS — I4891 Unspecified atrial fibrillation: Secondary | ICD-10-CM | POA: Diagnosis not present

## 2015-12-22 DIAGNOSIS — I10 Essential (primary) hypertension: Secondary | ICD-10-CM | POA: Diagnosis not present

## 2015-12-22 DIAGNOSIS — M1990 Unspecified osteoarthritis, unspecified site: Secondary | ICD-10-CM | POA: Diagnosis not present

## 2015-12-23 LAB — BODY FLUID CULTURE: CULTURE: NO GROWTH

## 2015-12-24 DIAGNOSIS — M300 Polyarteritis nodosa: Secondary | ICD-10-CM | POA: Diagnosis not present

## 2015-12-24 DIAGNOSIS — I69359 Hemiplegia and hemiparesis following cerebral infarction affecting unspecified side: Secondary | ICD-10-CM | POA: Diagnosis not present

## 2015-12-24 DIAGNOSIS — R1310 Dysphagia, unspecified: Secondary | ICD-10-CM | POA: Diagnosis not present

## 2015-12-24 DIAGNOSIS — I10 Essential (primary) hypertension: Secondary | ICD-10-CM | POA: Diagnosis not present

## 2015-12-24 DIAGNOSIS — M25522 Pain in left elbow: Secondary | ICD-10-CM | POA: Diagnosis not present

## 2015-12-24 DIAGNOSIS — I4891 Unspecified atrial fibrillation: Secondary | ICD-10-CM | POA: Diagnosis not present

## 2015-12-24 DIAGNOSIS — M25512 Pain in left shoulder: Secondary | ICD-10-CM | POA: Diagnosis not present

## 2015-12-24 DIAGNOSIS — F0391 Unspecified dementia with behavioral disturbance: Secondary | ICD-10-CM | POA: Diagnosis not present

## 2015-12-24 DIAGNOSIS — G4700 Insomnia, unspecified: Secondary | ICD-10-CM | POA: Diagnosis not present

## 2015-12-25 LAB — ANAEROBIC CULTURE

## 2015-12-30 ENCOUNTER — Encounter: Payer: Self-pay | Admitting: Physician Assistant

## 2016-01-05 ENCOUNTER — Ambulatory Visit: Payer: Medicare Other | Admitting: Physician Assistant

## 2016-01-10 ENCOUNTER — Ambulatory Visit: Payer: Medicare Other | Admitting: Neurology

## 2016-01-12 DIAGNOSIS — L89620 Pressure ulcer of left heel, unstageable: Secondary | ICD-10-CM | POA: Diagnosis not present

## 2016-01-12 DIAGNOSIS — I69352 Hemiplegia and hemiparesis following cerebral infarction affecting left dominant side: Secondary | ICD-10-CM | POA: Diagnosis not present

## 2016-01-12 DIAGNOSIS — L89512 Pressure ulcer of right ankle, stage 2: Secondary | ICD-10-CM | POA: Diagnosis not present

## 2016-01-12 DIAGNOSIS — L89151 Pressure ulcer of sacral region, stage 1: Secondary | ICD-10-CM | POA: Diagnosis not present

## 2016-01-12 DIAGNOSIS — J449 Chronic obstructive pulmonary disease, unspecified: Secondary | ICD-10-CM | POA: Diagnosis not present

## 2016-01-12 DIAGNOSIS — M064 Inflammatory polyarthropathy: Secondary | ICD-10-CM | POA: Diagnosis not present

## 2016-01-13 ENCOUNTER — Encounter: Payer: Self-pay | Admitting: Family Medicine

## 2016-01-13 ENCOUNTER — Telehealth: Payer: Self-pay | Admitting: Family Medicine

## 2016-01-13 ENCOUNTER — Ambulatory Visit (INDEPENDENT_AMBULATORY_CARE_PROVIDER_SITE_OTHER): Payer: Medicare Other | Admitting: Family Medicine

## 2016-01-13 VITALS — BP 116/60 | HR 94

## 2016-01-13 DIAGNOSIS — I639 Cerebral infarction, unspecified: Secondary | ICD-10-CM | POA: Diagnosis not present

## 2016-01-13 DIAGNOSIS — L89609 Pressure ulcer of unspecified heel, unspecified stage: Secondary | ICD-10-CM

## 2016-01-13 NOTE — Telephone Encounter (Signed)
Randa EvensJoanne from PT from [facility] called requested a verbal for rehab  For PT for two times a week for 9 weeks  She can be reached at [phone]

## 2016-01-13 NOTE — Telephone Encounter (Signed)
Calvin Diaz informed and verbalized understanding

## 2016-01-13 NOTE — Progress Notes (Signed)
   Subjective:    Patient ID: Calvin Diaz, male    DOB: 28-Jan-1940, 75 y.o.   MRN: [id number]  HPI He is here for a recheck. [facility] from November 28 until the 18th. They were working to help him feed and as well as get up and walk. A note on the 29th indicated pressure ulcer on his heels. The PA was notified on December 11. In the interim he was having the wound treated with zinc sulfate on a daily basis. Home health came to the house yesterday and noted the pressure ulcers and encouraged him to come here for further evaluation. Review of the record from one month all showed no documentation of evaluation by the PA or a physician.   Review of Systems     Objective:   Physical Exam Alert and in no distress. Exam of both heels shows a blackened eschar approximately 3-4 cm in size on the lateral aspect of both heels. It did not have an odor to it. no drainage was noted.       Assessment & Plan:  Decubitus ulcer of heel, unspecified laterality, unspecified ulcer stage I discussed this in detail with his brother and sister-in-law. We will get him seen by general surgery. The ulcers will need to be debrided. I explained that the depth of the ulcer at this point is unknown but could be quite extensive. Also explained that this is going to take several months of therapy. Until he is seen they will keep in heel elevated to take the pressure off that. Also discussed the fact that if he sits for long periods of time, that can also be an issue so the need to move him as much as possible.

## 2016-01-13 NOTE — Telephone Encounter (Signed)
Okay 

## 2016-01-14 ENCOUNTER — Telehealth: Payer: Self-pay

## 2016-01-14 NOTE — Telephone Encounter (Signed)
[facility] called to let us know that they do not see pts for heel ulcers.   Called [facility] and scheduled pt for jan 15th @ 2:15PM. Please advise if this is ok. This was first available. Trixie Rude/RLB

## 2016-01-14 NOTE — Telephone Encounter (Signed)
Take that appointment the let them know that if his feet get worse, take him to the emergency room and have him follow-up with me next week just to be safe

## 2016-01-15 DIAGNOSIS — L89512 Pressure ulcer of right ankle, stage 2: Secondary | ICD-10-CM | POA: Diagnosis not present

## 2016-01-15 DIAGNOSIS — I69352 Hemiplegia and hemiparesis following cerebral infarction affecting left dominant side: Secondary | ICD-10-CM | POA: Diagnosis not present

## 2016-01-15 DIAGNOSIS — L89151 Pressure ulcer of sacral region, stage 1: Secondary | ICD-10-CM | POA: Diagnosis not present

## 2016-01-15 DIAGNOSIS — M064 Inflammatory polyarthropathy: Secondary | ICD-10-CM | POA: Diagnosis not present

## 2016-01-15 DIAGNOSIS — J449 Chronic obstructive pulmonary disease, unspecified: Secondary | ICD-10-CM | POA: Diagnosis not present

## 2016-01-15 DIAGNOSIS — L89620 Pressure ulcer of left heel, unstageable: Secondary | ICD-10-CM | POA: Diagnosis not present

## 2016-01-18 NOTE — Telephone Encounter (Signed)
Pt's wife notified of recommendations and appt time at wound care center. Appt scheduled for 12/28 for wound recheck per JCL. Calvin Diaz

## 2016-01-19 DIAGNOSIS — L89620 Pressure ulcer of left heel, unstageable: Secondary | ICD-10-CM | POA: Diagnosis not present

## 2016-01-19 DIAGNOSIS — I69352 Hemiplegia and hemiparesis following cerebral infarction affecting left dominant side: Secondary | ICD-10-CM | POA: Diagnosis not present

## 2016-01-19 DIAGNOSIS — M064 Inflammatory polyarthropathy: Secondary | ICD-10-CM | POA: Diagnosis not present

## 2016-01-19 DIAGNOSIS — J449 Chronic obstructive pulmonary disease, unspecified: Secondary | ICD-10-CM | POA: Diagnosis not present

## 2016-01-19 DIAGNOSIS — L89512 Pressure ulcer of right ankle, stage 2: Secondary | ICD-10-CM | POA: Diagnosis not present

## 2016-01-19 DIAGNOSIS — L89151 Pressure ulcer of sacral region, stage 1: Secondary | ICD-10-CM | POA: Diagnosis not present

## 2016-01-20 ENCOUNTER — Ambulatory Visit (INDEPENDENT_AMBULATORY_CARE_PROVIDER_SITE_OTHER): Payer: Medicare Other | Admitting: Family Medicine

## 2016-01-20 ENCOUNTER — Telehealth: Payer: Self-pay | Admitting: Family Medicine

## 2016-01-20 DIAGNOSIS — L89609 Pressure ulcer of unspecified heel, unspecified stage: Secondary | ICD-10-CM | POA: Diagnosis not present

## 2016-01-20 DIAGNOSIS — L89620 Pressure ulcer of left heel, unstageable: Secondary | ICD-10-CM | POA: Diagnosis not present

## 2016-01-20 DIAGNOSIS — I639 Cerebral infarction, unspecified: Secondary | ICD-10-CM | POA: Diagnosis not present

## 2016-01-20 DIAGNOSIS — M064 Inflammatory polyarthropathy: Secondary | ICD-10-CM | POA: Diagnosis not present

## 2016-01-20 DIAGNOSIS — L89512 Pressure ulcer of right ankle, stage 2: Secondary | ICD-10-CM | POA: Diagnosis not present

## 2016-01-20 DIAGNOSIS — I69352 Hemiplegia and hemiparesis following cerebral infarction affecting left dominant side: Secondary | ICD-10-CM | POA: Diagnosis not present

## 2016-01-20 DIAGNOSIS — L89151 Pressure ulcer of sacral region, stage 1: Secondary | ICD-10-CM | POA: Diagnosis not present

## 2016-01-20 DIAGNOSIS — J449 Chronic obstructive pulmonary disease, unspecified: Secondary | ICD-10-CM | POA: Diagnosis not present

## 2016-01-20 NOTE — Telephone Encounter (Signed)
Ron ParkerJim Bell, Occupat. Therapist @ [facility], requesting orders to continue occ therapy on pt 2 times a week for  7 weeks and 1 time a week for 1 week. Ron ParkerJim Bell can be reached at [phone]

## 2016-01-20 NOTE — Progress Notes (Signed)
   Subjective:    Patient ID: Calvin Diaz, male    DOB: 03-Jul-1940, 75 y.o.   MRN: [id number]  HPI He is here for a recheck on his treatment for the decubitus ulcers on his heel. The home health nurses coming by twice per week and changing the dressing and in between times the sister-in-law is changing the dressing. They have no particular concerns or complaints. He is not complaining of any pain or drainage or foul odor.   Review of Systems     Objective:   Physical Exam I did examine the right foot only due to the dressing being on both feet. The wound does show some vascularity however no evidence of infection is noted.       Assessment & Plan:  Decubitus ulcer of heel, unspecified laterality, unspecified ulcer stage They will continue to change the dressing. Cautioned about looking for signs of infection including follow odor, pain and swelling.

## 2016-01-20 NOTE — Telephone Encounter (Signed)
ok 

## 2016-01-20 NOTE — Telephone Encounter (Signed)
Gave Jim bell the okay

## 2016-01-21 DIAGNOSIS — L89151 Pressure ulcer of sacral region, stage 1: Secondary | ICD-10-CM | POA: Diagnosis not present

## 2016-01-21 DIAGNOSIS — J449 Chronic obstructive pulmonary disease, unspecified: Secondary | ICD-10-CM | POA: Diagnosis not present

## 2016-01-21 DIAGNOSIS — L89620 Pressure ulcer of left heel, unstageable: Secondary | ICD-10-CM | POA: Diagnosis not present

## 2016-01-21 DIAGNOSIS — I69352 Hemiplegia and hemiparesis following cerebral infarction affecting left dominant side: Secondary | ICD-10-CM | POA: Diagnosis not present

## 2016-01-21 DIAGNOSIS — L89512 Pressure ulcer of right ankle, stage 2: Secondary | ICD-10-CM | POA: Diagnosis not present

## 2016-01-21 DIAGNOSIS — M064 Inflammatory polyarthropathy: Secondary | ICD-10-CM | POA: Diagnosis not present

## 2016-01-24 DIAGNOSIS — I69352 Hemiplegia and hemiparesis following cerebral infarction affecting left dominant side: Secondary | ICD-10-CM | POA: Diagnosis not present

## 2016-01-24 DIAGNOSIS — L89512 Pressure ulcer of right ankle, stage 2: Secondary | ICD-10-CM | POA: Diagnosis not present

## 2016-01-24 DIAGNOSIS — L89620 Pressure ulcer of left heel, unstageable: Secondary | ICD-10-CM | POA: Diagnosis not present

## 2016-01-24 DIAGNOSIS — L89151 Pressure ulcer of sacral region, stage 1: Secondary | ICD-10-CM | POA: Diagnosis not present

## 2016-01-24 DIAGNOSIS — J449 Chronic obstructive pulmonary disease, unspecified: Secondary | ICD-10-CM | POA: Diagnosis not present

## 2016-01-24 DIAGNOSIS — M064 Inflammatory polyarthropathy: Secondary | ICD-10-CM | POA: Diagnosis not present

## 2016-01-25 DIAGNOSIS — L89151 Pressure ulcer of sacral region, stage 1: Secondary | ICD-10-CM | POA: Diagnosis not present

## 2016-01-25 DIAGNOSIS — I69352 Hemiplegia and hemiparesis following cerebral infarction affecting left dominant side: Secondary | ICD-10-CM | POA: Diagnosis not present

## 2016-01-25 DIAGNOSIS — J449 Chronic obstructive pulmonary disease, unspecified: Secondary | ICD-10-CM | POA: Diagnosis not present

## 2016-01-25 DIAGNOSIS — M064 Inflammatory polyarthropathy: Secondary | ICD-10-CM | POA: Diagnosis not present

## 2016-01-25 DIAGNOSIS — L89512 Pressure ulcer of right ankle, stage 2: Secondary | ICD-10-CM | POA: Diagnosis not present

## 2016-01-25 DIAGNOSIS — L89620 Pressure ulcer of left heel, unstageable: Secondary | ICD-10-CM | POA: Diagnosis not present

## 2016-01-26 ENCOUNTER — Telehealth: Payer: Self-pay

## 2016-01-26 DIAGNOSIS — J449 Chronic obstructive pulmonary disease, unspecified: Secondary | ICD-10-CM | POA: Diagnosis not present

## 2016-01-26 DIAGNOSIS — I69352 Hemiplegia and hemiparesis following cerebral infarction affecting left dominant side: Secondary | ICD-10-CM | POA: Diagnosis not present

## 2016-01-26 DIAGNOSIS — M064 Inflammatory polyarthropathy: Secondary | ICD-10-CM | POA: Diagnosis not present

## 2016-01-26 DIAGNOSIS — L89512 Pressure ulcer of right ankle, stage 2: Secondary | ICD-10-CM | POA: Diagnosis not present

## 2016-01-26 DIAGNOSIS — L89151 Pressure ulcer of sacral region, stage 1: Secondary | ICD-10-CM | POA: Diagnosis not present

## 2016-01-26 DIAGNOSIS — L89620 Pressure ulcer of left heel, unstageable: Secondary | ICD-10-CM | POA: Diagnosis not present

## 2016-01-26 NOTE — Telephone Encounter (Signed)
LMTCB to wife. Trixie Rude/RLB

## 2016-01-26 NOTE — Telephone Encounter (Signed)
Have him use either Advil or Aleve for pain relief as well as Tylenol

## 2016-01-26 NOTE — Telephone Encounter (Signed)
Home nurse called to report pt is complaining of pain at 7/10 with stage 2 ulcer to left heal. They are doing wound care at the home and wonders if you can prescribe something for pain.   Wife # [phone].

## 2016-01-27 DIAGNOSIS — M064 Inflammatory polyarthropathy: Secondary | ICD-10-CM | POA: Diagnosis not present

## 2016-01-27 DIAGNOSIS — L89151 Pressure ulcer of sacral region, stage 1: Secondary | ICD-10-CM | POA: Diagnosis not present

## 2016-01-27 DIAGNOSIS — I69352 Hemiplegia and hemiparesis following cerebral infarction affecting left dominant side: Secondary | ICD-10-CM | POA: Diagnosis not present

## 2016-01-27 DIAGNOSIS — J449 Chronic obstructive pulmonary disease, unspecified: Secondary | ICD-10-CM | POA: Diagnosis not present

## 2016-01-27 DIAGNOSIS — L89620 Pressure ulcer of left heel, unstageable: Secondary | ICD-10-CM | POA: Diagnosis not present

## 2016-01-27 DIAGNOSIS — L89512 Pressure ulcer of right ankle, stage 2: Secondary | ICD-10-CM | POA: Diagnosis not present

## 2016-01-27 NOTE — Telephone Encounter (Signed)
Wife notified. Trixie Rude/RLB

## 2016-01-28 DIAGNOSIS — L89512 Pressure ulcer of right ankle, stage 2: Secondary | ICD-10-CM | POA: Diagnosis not present

## 2016-01-28 DIAGNOSIS — J449 Chronic obstructive pulmonary disease, unspecified: Secondary | ICD-10-CM | POA: Diagnosis not present

## 2016-01-28 DIAGNOSIS — I69352 Hemiplegia and hemiparesis following cerebral infarction affecting left dominant side: Secondary | ICD-10-CM | POA: Diagnosis not present

## 2016-01-28 DIAGNOSIS — M064 Inflammatory polyarthropathy: Secondary | ICD-10-CM | POA: Diagnosis not present

## 2016-01-28 DIAGNOSIS — L89620 Pressure ulcer of left heel, unstageable: Secondary | ICD-10-CM | POA: Diagnosis not present

## 2016-01-28 DIAGNOSIS — L89151 Pressure ulcer of sacral region, stage 1: Secondary | ICD-10-CM | POA: Diagnosis not present

## 2016-01-31 DIAGNOSIS — I69352 Hemiplegia and hemiparesis following cerebral infarction affecting left dominant side: Secondary | ICD-10-CM | POA: Diagnosis not present

## 2016-01-31 DIAGNOSIS — L89151 Pressure ulcer of sacral region, stage 1: Secondary | ICD-10-CM | POA: Diagnosis not present

## 2016-01-31 DIAGNOSIS — J449 Chronic obstructive pulmonary disease, unspecified: Secondary | ICD-10-CM | POA: Diagnosis not present

## 2016-01-31 DIAGNOSIS — L89512 Pressure ulcer of right ankle, stage 2: Secondary | ICD-10-CM | POA: Diagnosis not present

## 2016-01-31 DIAGNOSIS — M064 Inflammatory polyarthropathy: Secondary | ICD-10-CM | POA: Diagnosis not present

## 2016-01-31 DIAGNOSIS — L89620 Pressure ulcer of left heel, unstageable: Secondary | ICD-10-CM | POA: Diagnosis not present

## 2016-01-31 NOTE — Progress Notes (Signed)
Cardiology Office Note    Date:  02/01/2016   ID:  Calvin Diaz, DOB [birth year], MRN [id number]  PCP:  Carollee Herter, MD  Cardiologist: Dr. Tenny Craw  CC: follow up.   History of Present Illness:  Calvin Diaz is a 76 y.o. male with a history of HTN, HLD, dementia, CVA (07/2015) and paroxysmal atrial fibrillation on Xarelto who presents to clinic for follow up.   He was admitted to [facility] 8/15-8/17/17. He initially presented with chest pain. His EKG showed new onset a fib with rate of 69. He ruled out by enzymes for MI. Echo with normal LVF. He was started on Xarelto for increased CHADs2VASC score. HR controlled on no AVN blocking agents. Plan was for 4 weeks anticoagulation prior to DCCV and outpatient nuclear stress test.    I saw him for post hospital follow up on 09/22/15. He was in afib with controlled rate on no AVN blocking agents. I set up a stress test which was low risk. Plan was to bring him back and if he remained in afib after 1 month of Xarelto we would set up a DCCV. This was never completed because he ran out of Xarelto. I saw him again in the office on 12/07/15 and he was feeling very weak and white count elevated. 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 He also may have had a small acute on chronic ischemic CVA. He was continued on Xarelto. He was noted to be back in NSR during this admission. Discharged to SNF but now home.   Today he presents to clinic for follow up. He has been feeling quite well. No CP or SOB. No LE edema, orthopnea or PND. No dizziness or syncope. No blood in stool or urine. No palpitations. He has home health nursing coming every day and physical therapy. His blood pressures been okay at home although it is soft in the office today. He has been drinking enough fluids per wife and brother. His dementia has gotten a little bit worse since this last  admission.   Past Medical History:  Diagnosis Date  . Dementia   . High cholesterol   . Hypertension   . Myocardial infarction   . Stroke [facility])     Past Surgical History:  Procedure Laterality Date  . CORONARY ANGIOPLASTY      Current Medications: Outpatient Medications Prior to Visit  Medication Sig Dispense Refill  . colchicine 0.6 MG tablet Take 1 tablet (0.6 mg total) by mouth daily. 30 tablet 0  . memantine (NAMENDA) 5 MG tablet Take 2 tablets (10 mg total) by mouth daily. 90 tablet 3  . Multiple Vitamin (MULTIVITAMIN WITH MINERALS) TABS tablet Take 1 tablet by mouth daily.    . pravastatin (PRAVACHOL) 80 MG tablet Take 1 tablet (80 mg total) by mouth at bedtime. 90 tablet 3  . rivaroxaban (XARELTO) 20 MG TABS tablet Take 1 tablet (20 mg total) by mouth daily with supper. 90 tablet 3  . terazosin (HYTRIN) 1 MG capsule Take 1 capsule (1 mg total) by mouth at bedtime. 30 capsule 5  . amLODipine (NORVASC) 10 MG tablet Take 1 tablet (10 mg total) by mouth daily. 90 tablet 3  . predniSONE (DELTASONE) 10 MG tablet Take 4 tabs for 3 days, then 3 tabs for 3 days, then 2 tabs for 3 days, then 1 tab for 3 days, then 1/2 tab for 4 days. (Patient not taking: Reported on  01/13/2016) 32 tablet 0   No facility-administered medications prior to visit.      Allergies:   Patient has no known allergies.   Social History   Social History  . Marital status: Divorced    Spouse name: N/A  . Number of children: N/A  . Years of education: N/A   Social History Main Topics  . Smoking status: Former Smoker    Types: Cigarettes  . Smokeless tobacco: Former NeurosurgeonUser    Quit date: 08/18/2015  . Alcohol use No     Comment: Previously daily  . Drug use: No  . Sexual activity: Not Currently   Other Topics Concern  . None   Social History Narrative  . None     Family History:  The patient's family history includes Arrhythmia in his mother; Stroke in his maternal aunt.      ROS:   Please  see the history of present illness.    ROS All other systems reviewed and are negative.   PHYSICAL EXAM:   VS:  BP (!) 89/50 (BP Location: Right Arm, Patient Position: Sitting, Cuff Size: Normal) Comment: automatic cuff  Pulse 86   Ht 6' (1.829 m)   Wt 129 lb (58.5 kg)   SpO2 99%   BMI 17.50 kg/m    GEN: Well nourished, well developed, in no acute distress, chronically ill appearing HEENT: normal  Neck: no JVD, carotid bruits, or masses Cardiac: RRR; no murmurs, rubs, or gallops,no edema  Respiratory:  clear to auscultation bilaterally, normal work of breathing GI: soft, nontender, nondistended, + BS MS: no deformity or atrophy  Skin: warm and dry, no rash Neuro:  Alert and Oriented x 3, Strength and sensation are intact Psych: euthymic mood, full affect   Wt Readings from Last 3 Encounters:  02/01/16 129 lb (58.5 kg)  12/15/15 131 lb 6.3 oz (59.6 kg)  12/07/15 130 lb (59 kg)      Studies/Labs Reviewed:   EKG:  EKG is NOT ordered today.   Recent Labs: 12/13/2015: ALT 24; TSH 1.220 12/15/2015: Magnesium 1.8 12/18/2015: BUN 16; Creatinine, Ser 0.68; Potassium 3.6; Sodium 133 12/21/2015: Hemoglobin 11.8; Platelets 680   Lipid Panel    Component Value Date/Time   CHOL 100 12/16/2015 0445   TRIG 47 12/16/2015 0445   HDL 36 (L) 12/16/2015 0445   CHOLHDL 2.8 12/16/2015 0445   VLDL 9 12/16/2015 0445   LDLCALC 55 12/16/2015 0445    Additional studies/ records that were reviewed today include:  TTE: 09/08/2015 Study Conclusions - Left ventricle: The cavity size was normal. Systolic function was normal. The estimated ejection fraction was in the range of 60% to 65%. Wall motion was normal; there were no regional wall motion abnormalities. The study is not technically sufficient to allow evaluation of LV diastolic function. - Aortic valve: Nodular calcification of left and non coronary cusps - Mitral valve: Calcified annulus. - Right atrium: The atrium was  mildly dilated. - Atrial septum: No defect or patent foramen ovale was identified.   Nuclear stress test 09/29/15 Study Highlights    Nuclear stress EF: 74%.  There was no ST segment deviation noted during stress. Patient was in atrial fibrillation during the study.  There is a small in size, moderate in intensity, fixed defect in the mid anteroseptal and apical septal walls. No ischemia noted.  This is a low risk study.  The left ventricular ejection fraction is hyperdynamic (>65%).       ASSESSMENT & PLAN:  PAF: sounds regular on exam today.  Continue Xarelto 20 mg daily. CHADSVASC of 4.   HTN: BP is soft today. Will decrease amlodipine from 10 mg to 5 mg daily. He has home health nursing coming every day and they will call us if his blood pressure comes elevated over 140/90  HLD: continue statin   Hx of CVA: Continue Xarelto and statin.  Medication Adjustments/Labs and Tests Ordered: Current medicines are reviewed at length with the patient today.  Concerns regarding medicines are outlined above.  Medication changes, Labs and Tests ordered today are listed in the Patient Instructions below. Patient Instructions  Medication Instructions:  Your physician has recommended you make the following change in your medication:  1.  DECREASE the Amlodipine to 5 mg taking 1 tablet daily.  If your blood pressures starts to run higher than 140/90, call our office.   Labwork: None ordered  Testing/Procedures: None ordered  Follow-Up: Your physician wants you to follow-up in: 4 MONTHS WITH DR. Tenny Craw.  You will receive a reminder letter in the mail two months in advance. If you don't receive a letter, please call our office to schedule the follow-up appointment.   Any Other Special Instructions Will Be Listed Below (If Applicable).     If you need a refill on your cardiac medications before your next appointment, please call your pharmacy.      Signed, Cline Crock, PA-C  02/01/2016 2:56 PM    [facility] [address] [place], [place], [place]  [address] Phone: [phone]; Fax: [phone]

## 2016-02-01 ENCOUNTER — Ambulatory Visit (INDEPENDENT_AMBULATORY_CARE_PROVIDER_SITE_OTHER): Payer: Medicare Other | Admitting: Physician Assistant

## 2016-02-01 ENCOUNTER — Encounter: Payer: Self-pay | Admitting: Physician Assistant

## 2016-02-01 VITALS — BP 89/50 | HR 86 | Ht 72.0 in | Wt 129.0 lb

## 2016-02-01 DIAGNOSIS — M064 Inflammatory polyarthropathy: Secondary | ICD-10-CM | POA: Diagnosis not present

## 2016-02-01 DIAGNOSIS — Z8673 Personal history of transient ischemic attack (TIA), and cerebral infarction without residual deficits: Secondary | ICD-10-CM | POA: Diagnosis not present

## 2016-02-01 DIAGNOSIS — L89620 Pressure ulcer of left heel, unstageable: Secondary | ICD-10-CM | POA: Diagnosis not present

## 2016-02-01 DIAGNOSIS — L89151 Pressure ulcer of sacral region, stage 1: Secondary | ICD-10-CM | POA: Diagnosis not present

## 2016-02-01 DIAGNOSIS — I480 Paroxysmal atrial fibrillation: Secondary | ICD-10-CM | POA: Diagnosis not present

## 2016-02-01 DIAGNOSIS — E785 Hyperlipidemia, unspecified: Secondary | ICD-10-CM | POA: Diagnosis not present

## 2016-02-01 DIAGNOSIS — I10 Essential (primary) hypertension: Secondary | ICD-10-CM | POA: Diagnosis not present

## 2016-02-01 DIAGNOSIS — L89512 Pressure ulcer of right ankle, stage 2: Secondary | ICD-10-CM | POA: Diagnosis not present

## 2016-02-01 DIAGNOSIS — J449 Chronic obstructive pulmonary disease, unspecified: Secondary | ICD-10-CM | POA: Diagnosis not present

## 2016-02-01 DIAGNOSIS — I69352 Hemiplegia and hemiparesis following cerebral infarction affecting left dominant side: Secondary | ICD-10-CM | POA: Diagnosis not present

## 2016-02-01 MED ORDER — AMLODIPINE BESYLATE 5 MG PO TABS: 5.0000 mg | ORAL_TABLET | Freq: Every day | ORAL | 3 refills | Status: DC

## 2016-02-01 NOTE — Patient Instructions (Signed)
Medication Instructions:  Your physician has recommended you make the following change in your medication:  1.  DECREASE the Amlodipine to 5 mg taking 1 tablet daily.  If your blood pressures starts to run higher than 140/90, call our office.   Labwork: None ordered  Testing/Procedures: None ordered  Follow-Up: Your physician wants you to follow-up in: 4 MONTHS WITH DR. Tenny CrawOSS.  You will receive a reminder letter in the mail two months in advance. If you don't receive a letter, please call our office to schedule the follow-up appointment.   Any Other Special Instructions Will Be Listed Below (If Applicable).     If you need a refill on your cardiac medications before your next appointment, please call your pharmacy.

## 2016-02-02 DIAGNOSIS — L89151 Pressure ulcer of sacral region, stage 1: Secondary | ICD-10-CM | POA: Diagnosis not present

## 2016-02-02 DIAGNOSIS — J449 Chronic obstructive pulmonary disease, unspecified: Secondary | ICD-10-CM | POA: Diagnosis not present

## 2016-02-02 DIAGNOSIS — L89512 Pressure ulcer of right ankle, stage 2: Secondary | ICD-10-CM | POA: Diagnosis not present

## 2016-02-02 DIAGNOSIS — I69352 Hemiplegia and hemiparesis following cerebral infarction affecting left dominant side: Secondary | ICD-10-CM | POA: Diagnosis not present

## 2016-02-02 DIAGNOSIS — L89620 Pressure ulcer of left heel, unstageable: Secondary | ICD-10-CM | POA: Diagnosis not present

## 2016-02-02 DIAGNOSIS — M064 Inflammatory polyarthropathy: Secondary | ICD-10-CM | POA: Diagnosis not present

## 2016-02-03 DIAGNOSIS — L89512 Pressure ulcer of right ankle, stage 2: Secondary | ICD-10-CM | POA: Diagnosis not present

## 2016-02-03 DIAGNOSIS — J449 Chronic obstructive pulmonary disease, unspecified: Secondary | ICD-10-CM | POA: Diagnosis not present

## 2016-02-03 DIAGNOSIS — L89151 Pressure ulcer of sacral region, stage 1: Secondary | ICD-10-CM | POA: Diagnosis not present

## 2016-02-03 DIAGNOSIS — I69352 Hemiplegia and hemiparesis following cerebral infarction affecting left dominant side: Secondary | ICD-10-CM | POA: Diagnosis not present

## 2016-02-03 DIAGNOSIS — M064 Inflammatory polyarthropathy: Secondary | ICD-10-CM | POA: Diagnosis not present

## 2016-02-03 DIAGNOSIS — L89620 Pressure ulcer of left heel, unstageable: Secondary | ICD-10-CM | POA: Diagnosis not present

## 2016-02-06 ENCOUNTER — Encounter (HOSPITAL_COMMUNITY): Payer: Self-pay | Admitting: Emergency Medicine

## 2016-02-06 ENCOUNTER — Emergency Department (HOSPITAL_COMMUNITY): Payer: Medicare Other

## 2016-02-06 ENCOUNTER — Emergency Department (HOSPITAL_COMMUNITY)
Admission: EM | Admit: 2016-02-06 | Discharge: 2016-02-06 | Disposition: A | Discharge status: Home / Self Care | Payer: Medicare Other | Attending: Emergency Medicine | Admitting: Emergency Medicine

## 2016-02-06 DIAGNOSIS — I10 Essential (primary) hypertension: Secondary | ICD-10-CM | POA: Insufficient documentation

## 2016-02-06 DIAGNOSIS — I252 Old myocardial infarction: Secondary | ICD-10-CM | POA: Insufficient documentation

## 2016-02-06 DIAGNOSIS — Z79899 Other long term (current) drug therapy: Secondary | ICD-10-CM | POA: Insufficient documentation

## 2016-02-06 DIAGNOSIS — Z87891 Personal history of nicotine dependence: Secondary | ICD-10-CM | POA: Insufficient documentation

## 2016-02-06 DIAGNOSIS — Z8673 Personal history of transient ischemic attack (TIA), and cerebral infarction without residual deficits: Secondary | ICD-10-CM | POA: Diagnosis not present

## 2016-02-06 DIAGNOSIS — R410 Disorientation, unspecified: Secondary | ICD-10-CM | POA: Diagnosis not present

## 2016-02-06 DIAGNOSIS — R569 Unspecified convulsions: Secondary | ICD-10-CM | POA: Diagnosis not present

## 2016-02-06 LAB — CBC WITH DIFFERENTIAL/PLATELET
BASOS PCT: 0 %
Basophils Absolute: 0 10*3/uL (ref 0.0–0.1)
Eosinophils Absolute: 0.1 10*3/uL (ref 0.0–0.7)
Eosinophils Relative: 1 %
HEMATOCRIT: 34.7 % — AB (ref 39.0–52.0)
Hemoglobin: 11.2 g/dL — ABNORMAL LOW (ref 13.0–17.0)
LYMPHS ABS: 2.6 10*3/uL (ref 0.7–4.0)
Lymphocytes Relative: 40 %
MCH: 29.1 pg (ref 26.0–34.0)
MCHC: 32.3 g/dL (ref 30.0–36.0)
MCV: 90.1 fL (ref 78.0–100.0)
MONO ABS: 0.8 10*3/uL (ref 0.1–1.0)
Monocytes Relative: 13 %
NEUTROS ABS: 3 10*3/uL (ref 1.7–7.7)
Neutrophils Relative %: 46 %
Platelets: 263 10*3/uL (ref 150–400)
RBC: 3.85 MIL/uL — ABNORMAL LOW (ref 4.22–5.81)
RDW: 16 % — AB (ref 11.5–15.5)
WBC: 6.5 10*3/uL (ref 4.0–10.5)

## 2016-02-06 LAB — I-STAT CHEM 8, ED
BUN: 11 mg/dL (ref 6–20)
CALCIUM ION: 1.18 mmol/L (ref 1.15–1.40)
CREATININE: 1 mg/dL (ref 0.61–1.24)
Chloride: 108 mmol/L (ref 101–111)
GLUCOSE: 85 mg/dL (ref 65–99)
HCT: 33 % — ABNORMAL LOW (ref 39.0–52.0)
Hemoglobin: 11.2 g/dL — ABNORMAL LOW (ref 13.0–17.0)
Potassium: 3.3 mmol/L — ABNORMAL LOW (ref 3.5–5.1)
SODIUM: 143 mmol/L (ref 135–145)
TCO2: 35 mmol/L (ref 0–100)

## 2016-02-06 LAB — COMPREHENSIVE METABOLIC PANEL
ALBUMIN: 2.8 g/dL — AB (ref 3.5–5.0)
ALK PHOS: 41 U/L (ref 38–126)
ALT: 11 U/L — AB (ref 17–63)
ANION GAP: 8 (ref 5–15)
AST: 33 U/L (ref 15–41)
BUN: 8 mg/dL (ref 6–20)
CALCIUM: 8.8 mg/dL — AB (ref 8.9–10.3)
CO2: 28 mmol/L (ref 22–32)
CREATININE: 0.87 mg/dL (ref 0.61–1.24)
Chloride: 107 mmol/L (ref 101–111)
GFR calc Af Amer: >60 mL/min (ref >60)
GFR calc non Af Amer: >60 mL/min (ref >60)
GLUCOSE: 83 mg/dL (ref 65–99)
Potassium: 3.9 mmol/L (ref 3.5–5.1)
SODIUM: 143 mmol/L (ref 135–145)
Total Bilirubin: 0.6 mg/dL (ref 0.3–1.2)
Total Protein: 6.2 g/dL — ABNORMAL LOW (ref 6.5–8.1)

## 2016-02-06 LAB — CBG MONITORING, ED: Glucose-Capillary: 94 mg/dL (ref 65–99)

## 2016-02-06 LAB — MAGNESIUM: Magnesium: 1.8 mg/dL (ref 1.7–2.4)

## 2016-02-06 MED ORDER — SODIUM CHLORIDE 0.9 % IV SOLN
INTRAVENOUS | Status: DC
Start: 2016-02-06 — End: 2016-02-06
  Administered 2016-02-06: 14:00:00 via INTRAVENOUS

## 2016-02-06 MED ORDER — SODIUM CHLORIDE 0.9 % IV BOLUS (SEPSIS)
250.0000 mL | Freq: Once | INTRAVENOUS | Status: AC
Start: 2016-02-06 — End: 2016-02-06
  Administered 2016-02-06: 250 mL via INTRAVENOUS

## 2016-02-06 NOTE — ED Triage Notes (Signed)
Pt arrives via [facility], ems reports pt was sitting in a chair when he had what was described as a grand mal seizure lasting approx 15 seconds, ems reports pt did not fall during seizure and was post ictal upon their arrival. No known hx of seizures, pt normally a/ox3 when at his baseline due to dementia. CBG 121, pt received 500 ml ns pta. Left arm weakness residual from previous cva, no new neuro deficits per ems.

## 2016-02-06 NOTE — ED Notes (Signed)
Pt unable to urinate at this time.  

## 2016-02-06 NOTE — ED Notes (Signed)
In CT and Xray 

## 2016-02-06 NOTE — Discharge Instructions (Signed)
Workup here without any acute abnormalities. 100% clear what occurred. If it reoccurs or anything abnormal at occurs needs to get reevaluated. Keep appointment with primary care doctor. Today's workup to include stat head CT chest x-ray labs without significant abnormalities.

## 2016-02-06 NOTE — ED Provider Notes (Addendum)
[facility]-EMERGENCY DEPT Provider Note   CSN: [id number] Arrival date & time: 02/06/16  1238     History   Chief Complaint Chief Complaint  Patient presents with  . Seizures    HPI Calvin Diaz is a 76 y.o. male.   EMS patient was sitting in a chair I believe at the wellness clinic not as a patient. And had a brief bout 15 seconds of what appeared to be like generalized seizure. Patient did not fall postictal phase was very brief. Patient has a past stroke and some dementia. Stroke involves weakness in the left upper extremity. Everybody feels that he is back to baseline. Patient's been alert here answering questions. Denies any complaints. Family member now here. Cystic agrees that he is back to baseline.      Past Medical History:  Diagnosis Date  . Dementia   . High cholesterol   . Hypertension   . Myocardial infarction   . Stroke [facility])     Patient Active Problem List   Diagnosis Date Noted  . Pressure injury of skin 12/20/2015  . Elbow swelling, left   . Left elbow pain   . Delirium   . Dysphagia, post-stroke   . Tachycardia   . Leukocytosis   . Acute blood loss anemia   . Polyarticular arthritis   . Swelling   . CVA (cerebral vascular accident) (HCC) 12/14/2015  . Hypokalemia 12/14/2015  . Fever 12/13/2015  . OAB (overactive bladder) 09/20/2015  . Essential hypertension 09/07/2015  . Atrial fibrillation (HCC) 09/07/2015  . Dementia 09/07/2015  . History of CVA with residual deficit 08/18/2015  . Former smoker 08/18/2015  . History of alcohol abuse 08/18/2015  . Hyperlipidemia 08/18/2015    Past Surgical History:  Procedure Laterality Date  . CORONARY ANGIOPLASTY         Home Medications    Prior to Admission medications   Medication Sig Start Date End Date Taking? Authorizing Provider  amLODipine (NORVASC) 5 MG tablet Take 1 tablet (5 mg total) by mouth daily. 02/01/16  Yes Janetta Hora, PA-C  colchicine 0.6 MG tablet Take 1 tablet (0.6 mg  total) by mouth daily. 12/21/15  Yes Jennifer Chahn-Yang Choi, DO  memantine (NAMENDA) 5 MG tablet Take 2 tablets (10 mg total) by mouth daily. 09/15/15  Yes Ronnald Nian, MD  Multiple Vitamin (MULTIVITAMIN WITH MINERALS) TABS tablet Take 1 tablet by mouth daily.   Yes Historical Provider, MD  pravastatin (PRAVACHOL) 80 MG tablet Take 1 tablet (80 mg total) by mouth at bedtime. 12/07/15  Yes Janetta Hora, PA-C  rivaroxaban (XARELTO) 20 MG TABS tablet Take 1 tablet (20 mg total) by mouth daily with supper. 12/07/15  Yes Janetta Hora, PA-C  terazosin (HYTRIN) 1 MG capsule Take 1 capsule (1 mg total) by mouth at bedtime. 11/04/15  Yes Ronnald Nian, MD    Family History Family History  Problem Relation Age of Onset  . Arrhythmia Mother     Had pacemaker  . Stroke Maternal Aunt     Social History Social History  Substance Use Topics  . Smoking status: Former Smoker    Types: Cigarettes  . Smokeless tobacco: Former Neurosurgeon    Quit date: 08/18/2015  . Alcohol use No     Comment: Previously daily     Allergies   Patient has no known allergies.   Review of Systems Review of Systems  Constitutional: Negative for fever.  HENT: Negative for congestion.   Eyes: Negative  for visual disturbance.  Respiratory: Negative for shortness of breath.   Cardiovascular: Negative for chest pain.  Gastrointestinal: Negative for abdominal pain.  Genitourinary: Negative for dysuria.  Musculoskeletal: Negative for back pain and neck pain.  Skin: Positive for wound.  Neurological: Positive for seizures. Negative for headaches.  Hematological: Does not bruise/bleed easily.  Psychiatric/Behavioral: Positive for confusion.     Physical Exam Updated Vital Signs BP 108/64   Pulse 87   Temp 97.5 F (36.4 C)   Resp 11   Ht 6' (1.829 m)   Wt 58.5 kg   SpO2 100%   BMI 17.50 kg/m   Physical Exam  Constitutional: He appears well-developed and well-nourished. No distress.  HENT:    Head: Normocephalic and atraumatic.  Mouth/Throat: Oropharynx is clear and moist.  Eyes: Conjunctivae and EOM are normal. Pupils are equal, round, and reactive to light.  Neck: Normal range of motion. Neck supple.  Cardiovascular: Normal rate, regular rhythm and normal heart sounds.   Pulmonary/Chest: Effort normal and breath sounds normal. No respiratory distress.  Abdominal: Soft. Bowel sounds are normal. There is no tenderness.  Musculoskeletal:  Patient with good strength in both legs. Good strength in right arm. Has residual weakness in the left arm following previous stroke.  Patient has both heels are wrapped with a dressing. Ankle area and distal leg without any any erythema or edema. Patient has 2 nonhealing sores on the heels. Being followed by primary care doctor.  Neurological: He is alert. No cranial nerve deficit. He exhibits normal muscle tone. Coordination normal.  Except for baseline weakness to left upper extremity.  Nursing note and vitals reviewed.    ED Treatments / Results  Labs (all labs ordered are listed, but only abnormal results are displayed) Labs Reviewed  COMPREHENSIVE METABOLIC PANEL - Abnormal; Notable for the following:       Result Value   Calcium 8.8 (*)    Total Protein 6.2 (*)    Albumin 2.8 (*)    ALT 11 (*)    All other components within normal limits  CBC WITH DIFFERENTIAL/PLATELET - Abnormal; Notable for the following:    RBC 3.85 (*)    Hemoglobin 11.2 (*)    HCT 34.7 (*)    RDW 16.0 (*)    All other components within normal limits  I-STAT CHEM 8, ED - Abnormal; Notable for the following:    Potassium 3.3 (*)    Hemoglobin 11.2 (*)    HCT 33.0 (*)    All other components within normal limits  MAGNESIUM  URINALYSIS, ROUTINE W REFLEX MICROSCOPIC  CBG MONITORING, ED  CBG MONITORING, ED   Results for orders placed or performed during the hospital encounter of 02/06/16  Comprehensive metabolic panel  Result Value Ref Range   Sodium  143 135 - 145 mmol/L   Potassium 3.9 3.5 - 5.1 mmol/L   Chloride 107 101 - 111 mmol/L   CO2 28 22 - 32 mmol/L   Glucose, Bld 83 65 - 99 mg/dL   BUN 8 6 - 20 mg/dL   Creatinine, Ser 4.09 0.61 - 1.24 mg/dL   Calcium 8.8 (L) 8.9 - 10.3 mg/dL   Total Protein 6.2 (L) 6.5 - 8.1 g/dL   Albumin 2.8 (L) 3.5 - 5.0 g/dL   AST 33 15 - 41 U/L   ALT 11 (L) 17 - 63 U/L   Alkaline Phosphatase 41 38 - 126 U/L   Total Bilirubin 0.6 0.3 - 1.2 mg/dL  GFR calc non Af Amer >60 >60 mL/min   GFR calc Af Amer >60 >60 mL/min   Anion gap 8 5 - 15  CBC with Differential/Platelet  Result Value Ref Range   WBC 6.5 4.0 - 10.5 K/uL   RBC 3.85 (L) 4.22 - 5.81 MIL/uL   Hemoglobin 11.2 (L) 13.0 - 17.0 g/dL   HCT 16.1 (L) 09.6 - 04.5 %   MCV 90.1 78.0 - 100.0 fL   MCH 29.1 26.0 - 34.0 pg   MCHC 32.3 30.0 - 36.0 g/dL   RDW 40.9 (H) 81.1 - 91.4 %   Platelets 263 150 - 400 K/uL   Neutrophils Relative % 46 %   Neutro Abs 3.0 1.7 - 7.7 K/uL   Lymphocytes Relative 40 %   Lymphs Abs 2.6 0.7 - 4.0 K/uL   Monocytes Relative 13 %   Monocytes Absolute 0.8 0.1 - 1.0 K/uL   Eosinophils Relative 1 %   Eosinophils Absolute 0.1 0.0 - 0.7 K/uL   Basophils Relative 0 %   Basophils Absolute 0.0 0.0 - 0.1 K/uL  Magnesium  Result Value Ref Range   Magnesium 1.8 1.7 - 2.4 mg/dL  CBG monitoring, ED  Result Value Ref Range   Glucose-Capillary 94 65 - 99 mg/dL   Comment 1 Notify RN   I-stat Chem 8, ED  Result Value Ref Range   Sodium 143 135 - 145 mmol/L   Potassium 3.3 (L) 3.5 - 5.1 mmol/L   Chloride 108 101 - 111 mmol/L   BUN 11 6 - 20 mg/dL   Creatinine, Ser 7.82 0.61 - 1.24 mg/dL   Glucose, Bld 85 65 - 99 mg/dL   Calcium, Ion 9.56 2.13 - 1.40 mmol/L   TCO2 35 0 - 100 mmol/L   Hemoglobin 11.2 (L) 13.0 - 17.0 g/dL   HCT 08.6 (L) 57.8 - 46.9 %   Dg Chest 2 View  Result Date: 02/06/2016 CLINICAL DATA:  Seizure; Pt has a very strongly contracted left arm which could not be abducted or elevated off the chest without  the tech holding; Pt leaned strongly to the left, and the lateral image is rotated for these reasons. Tech held him for all images; Unable to relate a medical Hx EXAM: CHEST  2 VIEW COMPARISON:  12/13/2015 FINDINGS: Cardiac silhouette is normal in size. No mediastinal or hilar masses. No evidence of adenopathy. The lungs are hyperexpanded but clear. No pleural effusion. No pneumothorax. Skeletal structures are demineralized but grossly intact. IMPRESSION: No acute cardiopulmonary disease. Electronically Signed   By: Amie Portland M.D.   On: 02/06/2016 15:29   Ct Head Wo Contrast  Result Date: 02/06/2016 CLINICAL DATA:  Seizure today. EXAM: CT HEAD WITHOUT CONTRAST TECHNIQUE: Contiguous axial images were obtained from the base of the skull through the vertex without intravenous contrast. COMPARISON:  Head CT dated 12/15/2015 and brain MR dated 12/14/2015. FINDINGS: Brain: Diffusely enlarged ventricles and subarachnoid spaces. Patchy white matter low density in both cerebral hemispheres. Stable old right middle cerebral artery distribution and left frontal infarcts. No intracranial hemorrhage, mass lesion or CT evidence of acute infarction. Vascular: No hyperdense vessel or unexpected calcification. Skull: Normal. Negative for fracture or focal lesion. Sinuses/Orbits: No acute finding. Other: None. IMPRESSION: 1. No acute abnormality. 2. Stable atrophy, extensive chronic small vessel white matter ischemic changes, old right middle cerebral artery distribution infarct and old left frontal lobe infarct. Electronically Signed   By: Beckie Salts M.D.   On: 02/06/2016 14:58  EKG  EKG Interpretation  Date/Time:  Sunday February 06 2016 13:31:08 EST Ventricular Rate:  75 PR Interval:    QRS Duration: 76 QT Interval:  411 QTC Calculation: 460 R Axis:   89 Text Interpretation:  Sinus rhythm Borderline right axis deviation Probable anteroseptal infarct, old Artifact No significant change since last tracing  Confirmed by Jezabella Schriever  MD, Nneoma Harral [phone]) on 02/06/2016 1:39:09 PM       Radiology Dg Chest 2 View  Result Date: 02/06/2016 CLINICAL DATA:  Seizure; Pt has a very strongly contracted left arm which could not be abducted or elevated off the chest without the tech holding; Pt leaned strongly to the left, and the lateral image is rotated for these reasons. Tech held him for all images; Unable to relate a medical Hx EXAM: CHEST  2 VIEW COMPARISON:  12/13/2015 FINDINGS: Cardiac silhouette is normal in size. No mediastinal or hilar masses. No evidence of adenopathy. The lungs are hyperexpanded but clear. No pleural effusion. No pneumothorax. Skeletal structures are demineralized but grossly intact. IMPRESSION: No acute cardiopulmonary disease. Electronically Signed   By: Amie Portland M.D.   On: 02/06/2016 15:29   Ct Head Wo Contrast  Result Date: 02/06/2016 CLINICAL DATA:  Seizure today. EXAM: CT HEAD WITHOUT CONTRAST TECHNIQUE: Contiguous axial images were obtained from the base of the skull through the vertex without intravenous contrast. COMPARISON:  Head CT dated 12/15/2015 and brain MR dated 12/14/2015. FINDINGS: Brain: Diffusely enlarged ventricles and subarachnoid spaces. Patchy white matter low density in both cerebral hemispheres. Stable old right middle cerebral artery distribution and left frontal infarcts. No intracranial hemorrhage, mass lesion or CT evidence of acute infarction. Vascular: No hyperdense vessel or unexpected calcification. Skull: Normal. Negative for fracture or focal lesion. Sinuses/Orbits: No acute finding. Other: None. IMPRESSION: 1. No acute abnormality. 2. Stable atrophy, extensive chronic small vessel white matter ischemic changes, old right middle cerebral artery distribution infarct and old left frontal lobe infarct. Electronically Signed   By: Beckie Salts M.D.   On: 02/06/2016 14:58    Procedures Procedures (including critical care time)  Medications Ordered in  ED Medications  0.9 %  sodium chloride infusion ( Intravenous New Bag/Given 02/06/16 1348)  sodium chloride 0.9 % bolus 250 mL (0 mLs Intravenous Stopped 02/06/16 1521)     Initial Impression / Assessment and Plan / ED Course  I have reviewed the triage vital signs and the nursing notes.  Pertinent labs & imaging results that were available during my care of the patient were reviewed by me and considered in my medical decision making (see chart for details).  Clinical Course     not clear statically what occurred. Patient was normal earlier. Family member witness the event which does sound like a very brief seizure. Patient with no known history of seizures patient is back to baseline. Head CT without any acute abnormalities. Since it was so brief and patient is acting back to baseline and acting normal. Labs without any significant abnormalities slight decreasing calcium slight decrease in potassium but nothing to explain this event. Blood sugar was fine. Would probably recommend just follow-up with regular doctor if it reoccurs then additional evaluation is required.   Chest x-ray is still pending.    Final Clinical Impressions(s) / ED Diagnoses   Final diagnoses:  Seizure-like activity [facility])    New Prescriptions New Prescriptions   No medications on file     Vanetta Mulders, MD 02/06/16 1527   Gesturing without any acute  findings.   Vanetta MuldersScott Kevonte Vanecek, MD 02/06/16 [phone]

## 2016-02-06 NOTE — ED Notes (Signed)
Family at bedside. 

## 2016-02-07 ENCOUNTER — Encounter (HOSPITAL_BASED_OUTPATIENT_CLINIC_OR_DEPARTMENT_OTHER): Payer: Medicare Other | Attending: Internal Medicine

## 2016-02-07 ENCOUNTER — Ambulatory Visit: Payer: Medicare Other | Admitting: Neurology

## 2016-02-07 DIAGNOSIS — D649 Anemia, unspecified: Secondary | ICD-10-CM | POA: Insufficient documentation

## 2016-02-07 DIAGNOSIS — L89610 Pressure ulcer of right heel, unstageable: Secondary | ICD-10-CM | POA: Insufficient documentation

## 2016-02-07 DIAGNOSIS — I69352 Hemiplegia and hemiparesis following cerebral infarction affecting left dominant side: Secondary | ICD-10-CM | POA: Diagnosis not present

## 2016-02-07 DIAGNOSIS — E785 Hyperlipidemia, unspecified: Secondary | ICD-10-CM | POA: Insufficient documentation

## 2016-02-07 DIAGNOSIS — I481 Persistent atrial fibrillation: Secondary | ICD-10-CM | POA: Insufficient documentation

## 2016-02-07 DIAGNOSIS — F0390 Unspecified dementia without behavioral disturbance: Secondary | ICD-10-CM | POA: Diagnosis not present

## 2016-02-07 DIAGNOSIS — L89622 Pressure ulcer of left heel, stage 2: Secondary | ICD-10-CM | POA: Diagnosis not present

## 2016-02-07 DIAGNOSIS — Z7901 Long term (current) use of anticoagulants: Secondary | ICD-10-CM | POA: Diagnosis not present

## 2016-02-07 DIAGNOSIS — M064 Inflammatory polyarthropathy: Secondary | ICD-10-CM | POA: Diagnosis not present

## 2016-02-07 DIAGNOSIS — Z87891 Personal history of nicotine dependence: Secondary | ICD-10-CM | POA: Insufficient documentation

## 2016-02-07 DIAGNOSIS — I10 Essential (primary) hypertension: Secondary | ICD-10-CM | POA: Diagnosis not present

## 2016-02-07 DIAGNOSIS — L89612 Pressure ulcer of right heel, stage 2: Secondary | ICD-10-CM | POA: Diagnosis not present

## 2016-02-07 DIAGNOSIS — L89620 Pressure ulcer of left heel, unstageable: Secondary | ICD-10-CM | POA: Insufficient documentation

## 2016-02-07 DIAGNOSIS — L89512 Pressure ulcer of right ankle, stage 2: Secondary | ICD-10-CM | POA: Diagnosis not present

## 2016-02-07 DIAGNOSIS — L89151 Pressure ulcer of sacral region, stage 1: Secondary | ICD-10-CM | POA: Diagnosis not present

## 2016-02-07 DIAGNOSIS — J449 Chronic obstructive pulmonary disease, unspecified: Secondary | ICD-10-CM | POA: Diagnosis not present

## 2016-02-08 DIAGNOSIS — L89151 Pressure ulcer of sacral region, stage 1: Secondary | ICD-10-CM | POA: Diagnosis not present

## 2016-02-08 DIAGNOSIS — L89620 Pressure ulcer of left heel, unstageable: Secondary | ICD-10-CM | POA: Diagnosis not present

## 2016-02-08 DIAGNOSIS — J449 Chronic obstructive pulmonary disease, unspecified: Secondary | ICD-10-CM | POA: Diagnosis not present

## 2016-02-08 DIAGNOSIS — M064 Inflammatory polyarthropathy: Secondary | ICD-10-CM | POA: Diagnosis not present

## 2016-02-08 DIAGNOSIS — I69352 Hemiplegia and hemiparesis following cerebral infarction affecting left dominant side: Secondary | ICD-10-CM | POA: Diagnosis not present

## 2016-02-08 DIAGNOSIS — L89512 Pressure ulcer of right ankle, stage 2: Secondary | ICD-10-CM | POA: Diagnosis not present

## 2016-02-11 ENCOUNTER — Telehealth: Payer: Self-pay

## 2016-02-11 DIAGNOSIS — I69352 Hemiplegia and hemiparesis following cerebral infarction affecting left dominant side: Secondary | ICD-10-CM | POA: Diagnosis not present

## 2016-02-11 DIAGNOSIS — M064 Inflammatory polyarthropathy: Secondary | ICD-10-CM | POA: Diagnosis not present

## 2016-02-11 DIAGNOSIS — L89620 Pressure ulcer of left heel, unstageable: Secondary | ICD-10-CM | POA: Diagnosis not present

## 2016-02-11 DIAGNOSIS — L89151 Pressure ulcer of sacral region, stage 1: Secondary | ICD-10-CM | POA: Diagnosis not present

## 2016-02-11 DIAGNOSIS — J449 Chronic obstructive pulmonary disease, unspecified: Secondary | ICD-10-CM | POA: Diagnosis not present

## 2016-02-11 DIAGNOSIS — L89512 Pressure ulcer of right ankle, stage 2: Secondary | ICD-10-CM | POA: Diagnosis not present

## 2016-02-11 NOTE — Telephone Encounter (Signed)
Jim informed and verbalized understanding

## 2016-02-11 NOTE — Telephone Encounter (Signed)
Jim bell from [facility] called and said patient is having a lot of pain in his left arm 7 out of 10 most of the time patient is on OCT pain med he would like to know if you could give a Botox shot or cortisone shot to pt arm also his B/P in the mornings is around 110 systolic and afternoons in the 140's please advise

## 2016-02-11 NOTE — Telephone Encounter (Signed)
Can't comment on this unless I see him.

## 2016-02-14 DIAGNOSIS — M064 Inflammatory polyarthropathy: Secondary | ICD-10-CM | POA: Diagnosis not present

## 2016-02-14 DIAGNOSIS — L89512 Pressure ulcer of right ankle, stage 2: Secondary | ICD-10-CM | POA: Diagnosis not present

## 2016-02-14 DIAGNOSIS — L89620 Pressure ulcer of left heel, unstageable: Secondary | ICD-10-CM | POA: Diagnosis not present

## 2016-02-14 DIAGNOSIS — I69352 Hemiplegia and hemiparesis following cerebral infarction affecting left dominant side: Secondary | ICD-10-CM | POA: Diagnosis not present

## 2016-02-14 DIAGNOSIS — J449 Chronic obstructive pulmonary disease, unspecified: Secondary | ICD-10-CM | POA: Diagnosis not present

## 2016-02-14 DIAGNOSIS — L89151 Pressure ulcer of sacral region, stage 1: Secondary | ICD-10-CM | POA: Diagnosis not present

## 2016-02-15 ENCOUNTER — Telehealth: Payer: Self-pay | Admitting: Family Medicine

## 2016-02-15 DIAGNOSIS — J449 Chronic obstructive pulmonary disease, unspecified: Secondary | ICD-10-CM | POA: Diagnosis not present

## 2016-02-15 DIAGNOSIS — I69352 Hemiplegia and hemiparesis following cerebral infarction affecting left dominant side: Secondary | ICD-10-CM | POA: Diagnosis not present

## 2016-02-15 DIAGNOSIS — L89512 Pressure ulcer of right ankle, stage 2: Secondary | ICD-10-CM | POA: Diagnosis not present

## 2016-02-15 DIAGNOSIS — L89151 Pressure ulcer of sacral region, stage 1: Secondary | ICD-10-CM | POA: Diagnosis not present

## 2016-02-15 DIAGNOSIS — L89620 Pressure ulcer of left heel, unstageable: Secondary | ICD-10-CM | POA: Diagnosis not present

## 2016-02-15 DIAGNOSIS — M064 Inflammatory polyarthropathy: Secondary | ICD-10-CM | POA: Diagnosis not present

## 2016-02-15 NOTE — Telephone Encounter (Signed)
Tiffany with [facility] needs 2 x week for 3 more weeks wound care.  Please call her at [phone]

## 2016-02-15 NOTE — Telephone Encounter (Signed)
ok 

## 2016-02-16 DIAGNOSIS — I69352 Hemiplegia and hemiparesis following cerebral infarction affecting left dominant side: Secondary | ICD-10-CM | POA: Diagnosis not present

## 2016-02-16 DIAGNOSIS — L89512 Pressure ulcer of right ankle, stage 2: Secondary | ICD-10-CM | POA: Diagnosis not present

## 2016-02-16 DIAGNOSIS — L89620 Pressure ulcer of left heel, unstageable: Secondary | ICD-10-CM | POA: Diagnosis not present

## 2016-02-16 DIAGNOSIS — L89151 Pressure ulcer of sacral region, stage 1: Secondary | ICD-10-CM | POA: Diagnosis not present

## 2016-02-16 DIAGNOSIS — M064 Inflammatory polyarthropathy: Secondary | ICD-10-CM | POA: Diagnosis not present

## 2016-02-16 DIAGNOSIS — J449 Chronic obstructive pulmonary disease, unspecified: Secondary | ICD-10-CM | POA: Diagnosis not present

## 2016-02-16 NOTE — Telephone Encounter (Signed)
Think this was to go to you.  

## 2016-02-16 NOTE — Telephone Encounter (Signed)
Advised Calvin Diaz of same.

## 2016-02-17 DIAGNOSIS — I69352 Hemiplegia and hemiparesis following cerebral infarction affecting left dominant side: Secondary | ICD-10-CM | POA: Diagnosis not present

## 2016-02-17 DIAGNOSIS — L89512 Pressure ulcer of right ankle, stage 2: Secondary | ICD-10-CM | POA: Diagnosis not present

## 2016-02-17 DIAGNOSIS — L89620 Pressure ulcer of left heel, unstageable: Secondary | ICD-10-CM | POA: Diagnosis not present

## 2016-02-17 DIAGNOSIS — J449 Chronic obstructive pulmonary disease, unspecified: Secondary | ICD-10-CM | POA: Diagnosis not present

## 2016-02-17 DIAGNOSIS — L89151 Pressure ulcer of sacral region, stage 1: Secondary | ICD-10-CM | POA: Diagnosis not present

## 2016-02-17 DIAGNOSIS — M064 Inflammatory polyarthropathy: Secondary | ICD-10-CM | POA: Diagnosis not present

## 2016-02-18 DIAGNOSIS — L89610 Pressure ulcer of right heel, unstageable: Secondary | ICD-10-CM | POA: Diagnosis not present

## 2016-02-18 DIAGNOSIS — I481 Persistent atrial fibrillation: Secondary | ICD-10-CM | POA: Diagnosis not present

## 2016-02-18 DIAGNOSIS — L89613 Pressure ulcer of right heel, stage 3: Secondary | ICD-10-CM | POA: Diagnosis not present

## 2016-02-18 DIAGNOSIS — L89620 Pressure ulcer of left heel, unstageable: Secondary | ICD-10-CM | POA: Diagnosis not present

## 2016-02-18 DIAGNOSIS — D649 Anemia, unspecified: Secondary | ICD-10-CM | POA: Diagnosis not present

## 2016-02-18 DIAGNOSIS — Z87891 Personal history of nicotine dependence: Secondary | ICD-10-CM | POA: Diagnosis not present

## 2016-02-18 DIAGNOSIS — Z7901 Long term (current) use of anticoagulants: Secondary | ICD-10-CM | POA: Diagnosis not present

## 2016-02-22 DIAGNOSIS — M064 Inflammatory polyarthropathy: Secondary | ICD-10-CM | POA: Diagnosis not present

## 2016-02-22 DIAGNOSIS — L89620 Pressure ulcer of left heel, unstageable: Secondary | ICD-10-CM | POA: Diagnosis not present

## 2016-02-22 DIAGNOSIS — I69352 Hemiplegia and hemiparesis following cerebral infarction affecting left dominant side: Secondary | ICD-10-CM | POA: Diagnosis not present

## 2016-02-22 DIAGNOSIS — J449 Chronic obstructive pulmonary disease, unspecified: Secondary | ICD-10-CM | POA: Diagnosis not present

## 2016-02-22 DIAGNOSIS — L89512 Pressure ulcer of right ankle, stage 2: Secondary | ICD-10-CM | POA: Diagnosis not present

## 2016-02-22 DIAGNOSIS — L89151 Pressure ulcer of sacral region, stage 1: Secondary | ICD-10-CM | POA: Diagnosis not present

## 2016-02-23 ENCOUNTER — Other Ambulatory Visit: Payer: Self-pay

## 2016-02-23 ENCOUNTER — Telehealth: Payer: Self-pay | Admitting: Family Medicine

## 2016-02-23 DIAGNOSIS — M064 Inflammatory polyarthropathy: Secondary | ICD-10-CM | POA: Diagnosis not present

## 2016-02-23 DIAGNOSIS — L89620 Pressure ulcer of left heel, unstageable: Secondary | ICD-10-CM | POA: Diagnosis not present

## 2016-02-23 DIAGNOSIS — J449 Chronic obstructive pulmonary disease, unspecified: Secondary | ICD-10-CM | POA: Diagnosis not present

## 2016-02-23 DIAGNOSIS — L89151 Pressure ulcer of sacral region, stage 1: Secondary | ICD-10-CM | POA: Diagnosis not present

## 2016-02-23 DIAGNOSIS — I69352 Hemiplegia and hemiparesis following cerebral infarction affecting left dominant side: Secondary | ICD-10-CM | POA: Diagnosis not present

## 2016-02-23 DIAGNOSIS — L89512 Pressure ulcer of right ankle, stage 2: Secondary | ICD-10-CM | POA: Diagnosis not present

## 2016-02-23 MED ORDER — CITALOPRAM HYDROBROMIDE 20 MG PO TABS: 20.0000 mg | ORAL_TABLET | Freq: Every day | ORAL | 0 refills | Status: DC

## 2016-02-23 NOTE — Telephone Encounter (Signed)
Call and Celexa 20 mg 1 per day. Give 30 of those. Have them call me in a week or 2

## 2016-02-23 NOTE — Telephone Encounter (Signed)
Pt wife vallerie informed of med sent in and to call in a week or so to let Dr.lalonde know how he is doing she verbalized understanding

## 2016-02-23 NOTE — Telephone Encounter (Signed)
Pt's spouse, [name], called stating that pt's dementia is getting much worse. He is not sleeping well, wetting the bed, removing his bandages, more combative (which starts mostly at around 3:00pm - 4:00pm each day). Wife wants to know if Dr Susann GivensLalonde can send a med that can help. The med that was sent in previously was too expensive.

## 2016-02-24 ENCOUNTER — Encounter (HOSPITAL_BASED_OUTPATIENT_CLINIC_OR_DEPARTMENT_OTHER): Payer: Medicare Other | Attending: Internal Medicine

## 2016-02-24 DIAGNOSIS — I69352 Hemiplegia and hemiparesis following cerebral infarction affecting left dominant side: Secondary | ICD-10-CM | POA: Diagnosis not present

## 2016-02-24 DIAGNOSIS — L89622 Pressure ulcer of left heel, stage 2: Secondary | ICD-10-CM | POA: Diagnosis not present

## 2016-02-24 DIAGNOSIS — L89613 Pressure ulcer of right heel, stage 3: Secondary | ICD-10-CM | POA: Insufficient documentation

## 2016-02-24 DIAGNOSIS — L89512 Pressure ulcer of right ankle, stage 2: Secondary | ICD-10-CM | POA: Diagnosis not present

## 2016-02-24 DIAGNOSIS — I70203 Unspecified atherosclerosis of native arteries of extremities, bilateral legs: Secondary | ICD-10-CM | POA: Insufficient documentation

## 2016-02-24 DIAGNOSIS — L89151 Pressure ulcer of sacral region, stage 1: Secondary | ICD-10-CM | POA: Diagnosis not present

## 2016-02-24 DIAGNOSIS — M064 Inflammatory polyarthropathy: Secondary | ICD-10-CM | POA: Diagnosis not present

## 2016-02-24 DIAGNOSIS — L89620 Pressure ulcer of left heel, unstageable: Secondary | ICD-10-CM | POA: Insufficient documentation

## 2016-02-24 DIAGNOSIS — J449 Chronic obstructive pulmonary disease, unspecified: Secondary | ICD-10-CM | POA: Diagnosis not present

## 2016-02-24 DIAGNOSIS — L89612 Pressure ulcer of right heel, stage 2: Secondary | ICD-10-CM | POA: Diagnosis not present

## 2016-02-25 ENCOUNTER — Other Ambulatory Visit: Payer: Self-pay | Admitting: Internal Medicine

## 2016-02-25 DIAGNOSIS — L98491 Non-pressure chronic ulcer of skin of other sites limited to breakdown of skin: Secondary | ICD-10-CM

## 2016-02-25 DIAGNOSIS — L89151 Pressure ulcer of sacral region, stage 1: Secondary | ICD-10-CM | POA: Diagnosis not present

## 2016-02-25 DIAGNOSIS — M064 Inflammatory polyarthropathy: Secondary | ICD-10-CM | POA: Diagnosis not present

## 2016-02-25 DIAGNOSIS — J449 Chronic obstructive pulmonary disease, unspecified: Secondary | ICD-10-CM | POA: Diagnosis not present

## 2016-02-25 DIAGNOSIS — L89512 Pressure ulcer of right ankle, stage 2: Secondary | ICD-10-CM | POA: Diagnosis not present

## 2016-02-25 DIAGNOSIS — I69352 Hemiplegia and hemiparesis following cerebral infarction affecting left dominant side: Secondary | ICD-10-CM | POA: Diagnosis not present

## 2016-02-25 DIAGNOSIS — L89620 Pressure ulcer of left heel, unstageable: Secondary | ICD-10-CM | POA: Diagnosis not present

## 2016-02-28 DIAGNOSIS — L89151 Pressure ulcer of sacral region, stage 1: Secondary | ICD-10-CM | POA: Diagnosis not present

## 2016-02-28 DIAGNOSIS — J449 Chronic obstructive pulmonary disease, unspecified: Secondary | ICD-10-CM | POA: Diagnosis not present

## 2016-02-28 DIAGNOSIS — I69352 Hemiplegia and hemiparesis following cerebral infarction affecting left dominant side: Secondary | ICD-10-CM | POA: Diagnosis not present

## 2016-02-28 DIAGNOSIS — L89620 Pressure ulcer of left heel, unstageable: Secondary | ICD-10-CM | POA: Diagnosis not present

## 2016-02-28 DIAGNOSIS — L89512 Pressure ulcer of right ankle, stage 2: Secondary | ICD-10-CM | POA: Diagnosis not present

## 2016-02-28 DIAGNOSIS — M064 Inflammatory polyarthropathy: Secondary | ICD-10-CM | POA: Diagnosis not present

## 2016-03-01 ENCOUNTER — Ambulatory Visit (HOSPITAL_COMMUNITY)
Admission: RE | Admit: 2016-03-01 | Discharge: 2016-03-01 | Disposition: A | Discharge status: Home / Self Care | Payer: Medicare Other | Source: Ambulatory Visit | Attending: Cardiovascular Disease | Admitting: Cardiovascular Disease

## 2016-03-01 DIAGNOSIS — L98491 Non-pressure chronic ulcer of skin of other sites limited to breakdown of skin: Secondary | ICD-10-CM | POA: Diagnosis not present

## 2016-03-01 DIAGNOSIS — I739 Peripheral vascular disease, unspecified: Secondary | ICD-10-CM | POA: Diagnosis not present

## 2016-03-02 DIAGNOSIS — J449 Chronic obstructive pulmonary disease, unspecified: Secondary | ICD-10-CM | POA: Diagnosis not present

## 2016-03-02 DIAGNOSIS — L89151 Pressure ulcer of sacral region, stage 1: Secondary | ICD-10-CM | POA: Diagnosis not present

## 2016-03-02 DIAGNOSIS — L89512 Pressure ulcer of right ankle, stage 2: Secondary | ICD-10-CM | POA: Diagnosis not present

## 2016-03-02 DIAGNOSIS — M064 Inflammatory polyarthropathy: Secondary | ICD-10-CM | POA: Diagnosis not present

## 2016-03-02 DIAGNOSIS — I70203 Unspecified atherosclerosis of native arteries of extremities, bilateral legs: Secondary | ICD-10-CM | POA: Diagnosis not present

## 2016-03-02 DIAGNOSIS — L89613 Pressure ulcer of right heel, stage 3: Secondary | ICD-10-CM | POA: Diagnosis not present

## 2016-03-02 DIAGNOSIS — I69352 Hemiplegia and hemiparesis following cerebral infarction affecting left dominant side: Secondary | ICD-10-CM | POA: Diagnosis not present

## 2016-03-02 DIAGNOSIS — L89620 Pressure ulcer of left heel, unstageable: Secondary | ICD-10-CM | POA: Diagnosis not present

## 2016-03-03 DIAGNOSIS — L89620 Pressure ulcer of left heel, unstageable: Secondary | ICD-10-CM | POA: Diagnosis not present

## 2016-03-03 DIAGNOSIS — M064 Inflammatory polyarthropathy: Secondary | ICD-10-CM | POA: Diagnosis not present

## 2016-03-03 DIAGNOSIS — I69352 Hemiplegia and hemiparesis following cerebral infarction affecting left dominant side: Secondary | ICD-10-CM | POA: Diagnosis not present

## 2016-03-03 DIAGNOSIS — J449 Chronic obstructive pulmonary disease, unspecified: Secondary | ICD-10-CM | POA: Diagnosis not present

## 2016-03-03 DIAGNOSIS — L89512 Pressure ulcer of right ankle, stage 2: Secondary | ICD-10-CM | POA: Diagnosis not present

## 2016-03-03 DIAGNOSIS — L89151 Pressure ulcer of sacral region, stage 1: Secondary | ICD-10-CM | POA: Diagnosis not present

## 2016-03-06 DIAGNOSIS — L89620 Pressure ulcer of left heel, unstageable: Secondary | ICD-10-CM | POA: Diagnosis not present

## 2016-03-06 DIAGNOSIS — M064 Inflammatory polyarthropathy: Secondary | ICD-10-CM | POA: Diagnosis not present

## 2016-03-06 DIAGNOSIS — L89151 Pressure ulcer of sacral region, stage 1: Secondary | ICD-10-CM | POA: Diagnosis not present

## 2016-03-06 DIAGNOSIS — L89512 Pressure ulcer of right ankle, stage 2: Secondary | ICD-10-CM | POA: Diagnosis not present

## 2016-03-06 DIAGNOSIS — I69352 Hemiplegia and hemiparesis following cerebral infarction affecting left dominant side: Secondary | ICD-10-CM | POA: Diagnosis not present

## 2016-03-06 DIAGNOSIS — J449 Chronic obstructive pulmonary disease, unspecified: Secondary | ICD-10-CM | POA: Diagnosis not present

## 2016-03-07 DIAGNOSIS — L89151 Pressure ulcer of sacral region, stage 1: Secondary | ICD-10-CM | POA: Diagnosis not present

## 2016-03-07 DIAGNOSIS — M064 Inflammatory polyarthropathy: Secondary | ICD-10-CM | POA: Diagnosis not present

## 2016-03-07 DIAGNOSIS — L89512 Pressure ulcer of right ankle, stage 2: Secondary | ICD-10-CM | POA: Diagnosis not present

## 2016-03-07 DIAGNOSIS — L89620 Pressure ulcer of left heel, unstageable: Secondary | ICD-10-CM | POA: Diagnosis not present

## 2016-03-07 DIAGNOSIS — J449 Chronic obstructive pulmonary disease, unspecified: Secondary | ICD-10-CM | POA: Diagnosis not present

## 2016-03-07 DIAGNOSIS — I69352 Hemiplegia and hemiparesis following cerebral infarction affecting left dominant side: Secondary | ICD-10-CM | POA: Diagnosis not present

## 2016-03-08 DIAGNOSIS — L89151 Pressure ulcer of sacral region, stage 1: Secondary | ICD-10-CM | POA: Diagnosis not present

## 2016-03-08 DIAGNOSIS — L89620 Pressure ulcer of left heel, unstageable: Secondary | ICD-10-CM | POA: Diagnosis not present

## 2016-03-08 DIAGNOSIS — M064 Inflammatory polyarthropathy: Secondary | ICD-10-CM | POA: Diagnosis not present

## 2016-03-08 DIAGNOSIS — J449 Chronic obstructive pulmonary disease, unspecified: Secondary | ICD-10-CM | POA: Diagnosis not present

## 2016-03-08 DIAGNOSIS — L89512 Pressure ulcer of right ankle, stage 2: Secondary | ICD-10-CM | POA: Diagnosis not present

## 2016-03-08 DIAGNOSIS — I69352 Hemiplegia and hemiparesis following cerebral infarction affecting left dominant side: Secondary | ICD-10-CM | POA: Diagnosis not present

## 2016-03-09 ENCOUNTER — Ambulatory Visit: Payer: Medicare Other | Admitting: Neurology

## 2016-03-09 ENCOUNTER — Telehealth: Payer: Self-pay | Admitting: *Deleted

## 2016-03-09 ENCOUNTER — Telehealth: Payer: Self-pay | Admitting: Family Medicine

## 2016-03-09 NOTE — Telephone Encounter (Signed)
[place] a [place] with [facility]. Sending back to be reviewed. Tiffany can be reached at [phone].

## 2016-03-09 NOTE — Telephone Encounter (Signed)
No show new pt - called at 2:22pm to cancel 2:30pm appt - transportation canceled on them.

## 2016-03-10 ENCOUNTER — Encounter: Payer: Self-pay | Admitting: Neurology

## 2016-03-10 ENCOUNTER — Other Ambulatory Visit: Payer: Self-pay | Admitting: *Deleted

## 2016-03-10 DIAGNOSIS — I70203 Unspecified atherosclerosis of native arteries of extremities, bilateral legs: Secondary | ICD-10-CM | POA: Diagnosis not present

## 2016-03-10 DIAGNOSIS — I69352 Hemiplegia and hemiparesis following cerebral infarction affecting left dominant side: Secondary | ICD-10-CM | POA: Diagnosis not present

## 2016-03-10 DIAGNOSIS — L89151 Pressure ulcer of sacral region, stage 1: Secondary | ICD-10-CM | POA: Diagnosis not present

## 2016-03-10 DIAGNOSIS — L89613 Pressure ulcer of right heel, stage 3: Secondary | ICD-10-CM | POA: Diagnosis not present

## 2016-03-10 DIAGNOSIS — L89512 Pressure ulcer of right ankle, stage 2: Secondary | ICD-10-CM | POA: Diagnosis not present

## 2016-03-10 DIAGNOSIS — J449 Chronic obstructive pulmonary disease, unspecified: Secondary | ICD-10-CM | POA: Diagnosis not present

## 2016-03-10 DIAGNOSIS — M064 Inflammatory polyarthropathy: Secondary | ICD-10-CM | POA: Diagnosis not present

## 2016-03-10 DIAGNOSIS — L89620 Pressure ulcer of left heel, unstageable: Secondary | ICD-10-CM | POA: Diagnosis not present

## 2016-03-10 NOTE — Telephone Encounter (Signed)
Patients wife, Lonell GrandchildValerie Williams called and stated that the patient has not taken the xarelto for two days as he has been without medication and does not have any refills.

## 2016-03-12 DIAGNOSIS — L89612 Pressure ulcer of right heel, stage 2: Secondary | ICD-10-CM | POA: Diagnosis not present

## 2016-03-12 DIAGNOSIS — J449 Chronic obstructive pulmonary disease, unspecified: Secondary | ICD-10-CM | POA: Diagnosis not present

## 2016-03-12 DIAGNOSIS — M064 Inflammatory polyarthropathy: Secondary | ICD-10-CM | POA: Diagnosis not present

## 2016-03-12 DIAGNOSIS — F0391 Unspecified dementia with behavioral disturbance: Secondary | ICD-10-CM | POA: Diagnosis not present

## 2016-03-12 DIAGNOSIS — I69352 Hemiplegia and hemiparesis following cerebral infarction affecting left dominant side: Secondary | ICD-10-CM | POA: Diagnosis not present

## 2016-03-12 DIAGNOSIS — L89620 Pressure ulcer of left heel, unstageable: Secondary | ICD-10-CM | POA: Diagnosis not present

## 2016-03-13 DIAGNOSIS — L89612 Pressure ulcer of right heel, stage 2: Secondary | ICD-10-CM | POA: Diagnosis not present

## 2016-03-13 DIAGNOSIS — J449 Chronic obstructive pulmonary disease, unspecified: Secondary | ICD-10-CM | POA: Diagnosis not present

## 2016-03-13 DIAGNOSIS — L89620 Pressure ulcer of left heel, unstageable: Secondary | ICD-10-CM | POA: Diagnosis not present

## 2016-03-13 DIAGNOSIS — I69352 Hemiplegia and hemiparesis following cerebral infarction affecting left dominant side: Secondary | ICD-10-CM | POA: Diagnosis not present

## 2016-03-13 DIAGNOSIS — M064 Inflammatory polyarthropathy: Secondary | ICD-10-CM | POA: Diagnosis not present

## 2016-03-13 DIAGNOSIS — F0391 Unspecified dementia with behavioral disturbance: Secondary | ICD-10-CM | POA: Diagnosis not present

## 2016-03-15 DIAGNOSIS — L89612 Pressure ulcer of right heel, stage 2: Secondary | ICD-10-CM | POA: Diagnosis not present

## 2016-03-15 DIAGNOSIS — F0391 Unspecified dementia with behavioral disturbance: Secondary | ICD-10-CM | POA: Diagnosis not present

## 2016-03-15 DIAGNOSIS — L89620 Pressure ulcer of left heel, unstageable: Secondary | ICD-10-CM | POA: Diagnosis not present

## 2016-03-15 DIAGNOSIS — M064 Inflammatory polyarthropathy: Secondary | ICD-10-CM | POA: Diagnosis not present

## 2016-03-15 DIAGNOSIS — I69352 Hemiplegia and hemiparesis following cerebral infarction affecting left dominant side: Secondary | ICD-10-CM | POA: Diagnosis not present

## 2016-03-15 DIAGNOSIS — J449 Chronic obstructive pulmonary disease, unspecified: Secondary | ICD-10-CM | POA: Diagnosis not present

## 2016-03-16 DIAGNOSIS — M064 Inflammatory polyarthropathy: Secondary | ICD-10-CM | POA: Diagnosis not present

## 2016-03-16 DIAGNOSIS — J449 Chronic obstructive pulmonary disease, unspecified: Secondary | ICD-10-CM | POA: Diagnosis not present

## 2016-03-16 DIAGNOSIS — L89612 Pressure ulcer of right heel, stage 2: Secondary | ICD-10-CM | POA: Diagnosis not present

## 2016-03-16 DIAGNOSIS — F0391 Unspecified dementia with behavioral disturbance: Secondary | ICD-10-CM | POA: Diagnosis not present

## 2016-03-16 DIAGNOSIS — L89620 Pressure ulcer of left heel, unstageable: Secondary | ICD-10-CM | POA: Diagnosis not present

## 2016-03-16 DIAGNOSIS — I69352 Hemiplegia and hemiparesis following cerebral infarction affecting left dominant side: Secondary | ICD-10-CM | POA: Diagnosis not present

## 2016-03-17 DIAGNOSIS — I70203 Unspecified atherosclerosis of native arteries of extremities, bilateral legs: Secondary | ICD-10-CM | POA: Diagnosis not present

## 2016-03-17 DIAGNOSIS — L89629 Pressure ulcer of left heel, unspecified stage: Secondary | ICD-10-CM | POA: Diagnosis not present

## 2016-03-17 DIAGNOSIS — L89620 Pressure ulcer of left heel, unstageable: Secondary | ICD-10-CM | POA: Diagnosis not present

## 2016-03-17 DIAGNOSIS — L89613 Pressure ulcer of right heel, stage 3: Secondary | ICD-10-CM | POA: Diagnosis not present

## 2016-03-17 DIAGNOSIS — L89619 Pressure ulcer of right heel, unspecified stage: Secondary | ICD-10-CM | POA: Diagnosis not present

## 2016-03-20 DIAGNOSIS — I69352 Hemiplegia and hemiparesis following cerebral infarction affecting left dominant side: Secondary | ICD-10-CM | POA: Diagnosis not present

## 2016-03-20 DIAGNOSIS — L89620 Pressure ulcer of left heel, unstageable: Secondary | ICD-10-CM | POA: Diagnosis not present

## 2016-03-20 DIAGNOSIS — J449 Chronic obstructive pulmonary disease, unspecified: Secondary | ICD-10-CM | POA: Diagnosis not present

## 2016-03-20 DIAGNOSIS — L89612 Pressure ulcer of right heel, stage 2: Secondary | ICD-10-CM | POA: Diagnosis not present

## 2016-03-20 DIAGNOSIS — F0391 Unspecified dementia with behavioral disturbance: Secondary | ICD-10-CM | POA: Diagnosis not present

## 2016-03-20 DIAGNOSIS — M064 Inflammatory polyarthropathy: Secondary | ICD-10-CM | POA: Diagnosis not present

## 2016-03-22 DIAGNOSIS — L89620 Pressure ulcer of left heel, unstageable: Secondary | ICD-10-CM | POA: Diagnosis not present

## 2016-03-22 DIAGNOSIS — F0391 Unspecified dementia with behavioral disturbance: Secondary | ICD-10-CM | POA: Diagnosis not present

## 2016-03-22 DIAGNOSIS — I69352 Hemiplegia and hemiparesis following cerebral infarction affecting left dominant side: Secondary | ICD-10-CM | POA: Diagnosis not present

## 2016-03-22 DIAGNOSIS — M064 Inflammatory polyarthropathy: Secondary | ICD-10-CM | POA: Diagnosis not present

## 2016-03-22 DIAGNOSIS — L89612 Pressure ulcer of right heel, stage 2: Secondary | ICD-10-CM | POA: Diagnosis not present

## 2016-03-22 DIAGNOSIS — J449 Chronic obstructive pulmonary disease, unspecified: Secondary | ICD-10-CM | POA: Diagnosis not present

## 2016-03-27 DIAGNOSIS — L89620 Pressure ulcer of left heel, unstageable: Secondary | ICD-10-CM | POA: Diagnosis not present

## 2016-03-27 DIAGNOSIS — L89612 Pressure ulcer of right heel, stage 2: Secondary | ICD-10-CM | POA: Diagnosis not present

## 2016-03-27 DIAGNOSIS — J449 Chronic obstructive pulmonary disease, unspecified: Secondary | ICD-10-CM | POA: Diagnosis not present

## 2016-03-27 DIAGNOSIS — M064 Inflammatory polyarthropathy: Secondary | ICD-10-CM | POA: Diagnosis not present

## 2016-03-27 DIAGNOSIS — I69352 Hemiplegia and hemiparesis following cerebral infarction affecting left dominant side: Secondary | ICD-10-CM | POA: Diagnosis not present

## 2016-03-27 DIAGNOSIS — F0391 Unspecified dementia with behavioral disturbance: Secondary | ICD-10-CM | POA: Diagnosis not present

## 2016-03-29 ENCOUNTER — Encounter: Payer: Medicare Other | Admitting: Cardiovascular Disease

## 2016-03-29 DIAGNOSIS — L89612 Pressure ulcer of right heel, stage 2: Secondary | ICD-10-CM | POA: Diagnosis not present

## 2016-03-29 DIAGNOSIS — L89620 Pressure ulcer of left heel, unstageable: Secondary | ICD-10-CM | POA: Diagnosis not present

## 2016-03-29 DIAGNOSIS — M064 Inflammatory polyarthropathy: Secondary | ICD-10-CM | POA: Diagnosis not present

## 2016-03-29 DIAGNOSIS — F0391 Unspecified dementia with behavioral disturbance: Secondary | ICD-10-CM | POA: Diagnosis not present

## 2016-03-29 DIAGNOSIS — J449 Chronic obstructive pulmonary disease, unspecified: Secondary | ICD-10-CM | POA: Diagnosis not present

## 2016-03-29 DIAGNOSIS — I69352 Hemiplegia and hemiparesis following cerebral infarction affecting left dominant side: Secondary | ICD-10-CM | POA: Diagnosis not present

## 2016-03-30 ENCOUNTER — Ambulatory Visit (INDEPENDENT_AMBULATORY_CARE_PROVIDER_SITE_OTHER): Payer: Medicare Other | Admitting: Neurology

## 2016-03-30 ENCOUNTER — Encounter: Payer: Self-pay | Admitting: Neurology

## 2016-03-30 VITALS — BP 119/66 | HR 81

## 2016-03-30 DIAGNOSIS — I638 Other cerebral infarction: Secondary | ICD-10-CM | POA: Diagnosis not present

## 2016-03-30 DIAGNOSIS — R569 Unspecified convulsions: Secondary | ICD-10-CM | POA: Diagnosis not present

## 2016-03-30 DIAGNOSIS — I482 Chronic atrial fibrillation, unspecified: Secondary | ICD-10-CM

## 2016-03-30 DIAGNOSIS — F0390 Unspecified dementia without behavioral disturbance: Secondary | ICD-10-CM

## 2016-03-30 DIAGNOSIS — I6389 Other cerebral infarction: Secondary | ICD-10-CM

## 2016-03-30 MED ORDER — DIVALPROEX SODIUM 125 MG PO CSDR: 500.0000 mg | DELAYED_RELEASE_CAPSULE | Freq: Two times a day (BID) | ORAL | 11 refills | Status: DC

## 2016-03-30 MED ORDER — CLOPIDOGREL BISULFATE 75 MG PO TABS: 75.0000 mg | ORAL_TABLET | Freq: Every day | ORAL | 11 refills | Status: DC

## 2016-03-30 NOTE — Progress Notes (Signed)
PATIENT: Calvin Diaz DOB: 10-23-1940  Chief Complaint  Patient presents with  . Cerebrovascular Accident    MMSE 15/30 - 4 animals. He is here with his wife, Calvin Diaz.  He had a stroke in March 2017 and he still has significant left leg weakness.  He completed PT two weeks ago.  He walks some at home but for very limited distances.  His memory has been problematic since his stroke.  Marland Kitchen PCP    Calvin Arrow, MD     HISTORICAL  Calvin Diaz is a 76 years old right-handed male, accompanied by his wife,, seen in refer by referred by primary care physician Calvin Diaz to follow-up on stroke, initial evaluation was on March 30 2016.  He had a history of hypertension, hyperlipidemia, coronary artery disease, s/p angioplasty, stroke, dementia, used to smoke pack a day for more than 20 years, heavy daily alcohol use, quit in 2017.  He is a retired Naval architect at age 62.  He has multiple strokes in the past, was treated in [place], he just moved to [place] in August 2017, lives with his wife, at baseline he sits down most of the time, watching TV, just finished his most recent physical therapy in February 2018, he has significant bilateral lower extremity swelling, nonhealing ulcer at foot, has home health nurse  He has significant memory loss, gradually getting worse, agitation, sometimes up 24 hours, difficulty sleeping. It happened every 2 weeks.  I reviewed the hospital record he was taken to the emergency room on February 06 2016 after an episode of seizure.  I also reviewed cardiology note in January 2018, per record, echocardiogram showed normal left ventricular function, new onset atrial fibrillation with heart rate of 69 in August 2017, he was started on Xarelto since then, stress test showed low risk, there was mention of patient noncompliance with Xarelto, on previous cardiologist's note  Today wife reported that they could not afford Xarelto, has run out of the prescription  since February 2018.  I personally reviewed MRI of the brain in November 2017: Motion degraded, remote large right MCA encephalomalacia, there was also evidence of chronic left MCA stroke.  CT angiogram of head and neck in November 2017,  extensive atherosclerotic plaque at the aortic arch, right carotid artery less than 50% stenosis, left carotid artery calcified plaque at carotid bifurcation without left ICA stenosis, moderate left vertebral artery stenosis, mild right stenosis,  MRI of left elbow November 2017, large left elbow joint effusion with synovitis,  REVIEW OF SYSTEMS: Full 14 system review of systems performed and notable only for joint swelling, runny nose, incontinence, memory loss, confusion, insomnia, depression  ALLERGIES: No Known Allergies  HOME MEDICATIONS: Current Outpatient Prescriptions  Medication Sig Dispense Refill  . amLODipine (NORVASC) 5 MG tablet Take 1 tablet (5 mg total) by mouth daily. 90 tablet 3  . citalopram (CELEXA) 20 MG tablet Take 1 tablet (20 mg total) by mouth daily. 30 tablet 0  . colchicine 0.6 MG tablet Take 1 tablet (0.6 mg total) by mouth daily. 30 tablet 0  . memantine (NAMENDA) 5 MG tablet Take 2 tablets (10 mg total) by mouth daily. 90 tablet 3  . Multiple Vitamin (MULTIVITAMIN WITH MINERALS) TABS tablet Take 1 tablet by mouth daily.    . pravastatin (PRAVACHOL) 80 MG tablet Take 1 tablet (80 mg total) by mouth at bedtime. 90 tablet 3  . rivaroxaban (XARELTO) 20 MG TABS tablet Take 1 tablet (20 mg total)  by mouth daily with supper. 90 tablet 3  . terazosin (HYTRIN) 1 MG capsule Take 1 capsule (1 mg total) by mouth at bedtime. 30 capsule 5   No current facility-administered medications for this visit.     PAST MEDICAL HISTORY: Past Medical History:  Diagnosis Date  . Dementia   . High cholesterol   . Hypertension   . Myocardial infarction   . Stroke [facility])     PAST SURGICAL HISTORY: Past Surgical History:  Procedure Laterality  Date  . CORONARY ANGIOPLASTY      FAMILY HISTORY: Family History  Problem Relation Age of Onset  . Arrhythmia Mother     Had pacemaker  . Stroke Mother   . Stroke Maternal Aunt   . Heart attack Father     SOCIAL HISTORY:  Social History   Social History  . Marital status: Divorced    Spouse name: N/A  . Number of children: 4  . Years of education: 12   Occupational History  . Retired    Social History Main Topics  . Smoking status: Former Smoker    Types: Cigarettes  . Smokeless tobacco: Former Neurosurgeon    Quit date: 08/18/2015  . Alcohol use No     Comment: Previously daily  . Drug use: No  . Sexual activity: Not Currently   Other Topics Concern  . Not on file   Social History Narrative   Lives at home with his wife.   Right-handed.   No caffeine use.        PHYSICAL EXAM   Vitals:   03/30/16 1407  BP: 119/66  Pulse: 81    Not recorded      There is no height or weight on file to calculate BMI.  PHYSICAL EXAMNIATION:  Gen: NAD, conversant, well nourised, obese, well groomed                     Cardiovascular: Regular rate rhythm, no peripheral edema, warm, nontender. Eyes: Conjunctivae clear without exudates or hemorrhage Neck: Supple, no carotid bruits. Pulmonary: Clear to auscultation bilaterally   NEUROLOGICAL EXAM:  MENTAL STATUS: in wheelchair, cooperative on conversation and neurological examination Speech:    Speech is normal; fluent and spontaneous with normal comprehension.  Cognition: Mini-Mental Status Examination 15/30, animal naming 4     Orientation: He is not oriented to date, year, month, day, place     recent and remote memory: He missed 3 out of 3 recall      Attention span and concentration: Was not able to spell world backwards.     Normal Language, naming, repeating,spontaneous speech: He was not able to copy figure     Fund of knowledge   CRANIAL NERVES: CN II: Visual fields are full to confrontation. Fundoscopic exam  is normal with sharp discs and no vascular changes. Pupils are round equal and briskly reactive to light. CN III, IV, VI: extraocular movement are normal. No ptosis. CN V: Facial sensation is intact to pinprick in all 3 divisions bilaterally. Corneal responses are intact.  CN VII: Face is symmetric with normal eye closure and smile. CN VIII: Hearing is normal to rubbing fingers CN IX, X: Palate elevates symmetrically. Phonation is normal. CN XI: Head turning and shoulder shrug are intact CN XII: Tongue is midline with normal movements and no atrophy.  MOTOR: Mild spastic left hemiparesis, guarding his left elbow,  SENSORY: Length dependent decreased light touch pinprick,   COORDINATION: Rapid alternating movements and  fine finger movements are intact. There is no dysmetria on finger-to-nose and heel-knee-shin.    GAIT/STANCE: He needs assistance to get up from seated position, leaning forward, mild spastic left hemi-circumferential gait, cautious, unsteady    DIAGNOSTIC DATA (LABS, IMAGING, TESTING) - I reviewed patient records, labs, notes, testing and imaging myself where available.   ASSESSMENT AND PLAN  Calvin Diaz is a 76 y.o. male    history of large vessel stroke  new-onset atrial fibrillation in August 2017,  He was supposed to take Xarelto, but could not afford the medication, has stopped the medicine since February  I have started Plavix 75 mg daily today, he would have cardiology evaluation on April 11 2016 Dementia with agitation Seizure  Add on Depakote sprinkle 125mg  4 tabs (=500mg ) bid  Calvin Diaz, M.D. Ph.D.  [facility] [address], [address] [place], [place] [address] Ph: [phone]) [phone] Fax: [phone]  CC: Calvin ArrowEve Knapp, MD

## 2016-03-31 ENCOUNTER — Encounter (HOSPITAL_BASED_OUTPATIENT_CLINIC_OR_DEPARTMENT_OTHER): Payer: Medicare Other | Attending: Internal Medicine

## 2016-03-31 DIAGNOSIS — L89620 Pressure ulcer of left heel, unstageable: Secondary | ICD-10-CM | POA: Diagnosis not present

## 2016-03-31 DIAGNOSIS — M064 Inflammatory polyarthropathy: Secondary | ICD-10-CM | POA: Diagnosis not present

## 2016-03-31 DIAGNOSIS — F0391 Unspecified dementia with behavioral disturbance: Secondary | ICD-10-CM | POA: Diagnosis not present

## 2016-03-31 DIAGNOSIS — L89622 Pressure ulcer of left heel, stage 2: Secondary | ICD-10-CM | POA: Diagnosis not present

## 2016-03-31 DIAGNOSIS — I69352 Hemiplegia and hemiparesis following cerebral infarction affecting left dominant side: Secondary | ICD-10-CM | POA: Diagnosis not present

## 2016-03-31 DIAGNOSIS — L89612 Pressure ulcer of right heel, stage 2: Secondary | ICD-10-CM | POA: Diagnosis not present

## 2016-03-31 DIAGNOSIS — Z7901 Long term (current) use of anticoagulants: Secondary | ICD-10-CM | POA: Insufficient documentation

## 2016-03-31 DIAGNOSIS — D649 Anemia, unspecified: Secondary | ICD-10-CM | POA: Insufficient documentation

## 2016-03-31 DIAGNOSIS — L89610 Pressure ulcer of right heel, unstageable: Secondary | ICD-10-CM | POA: Insufficient documentation

## 2016-03-31 DIAGNOSIS — I481 Persistent atrial fibrillation: Secondary | ICD-10-CM | POA: Insufficient documentation

## 2016-03-31 DIAGNOSIS — I10 Essential (primary) hypertension: Secondary | ICD-10-CM | POA: Diagnosis not present

## 2016-03-31 DIAGNOSIS — Z8673 Personal history of transient ischemic attack (TIA), and cerebral infarction without residual deficits: Secondary | ICD-10-CM | POA: Diagnosis not present

## 2016-03-31 DIAGNOSIS — J449 Chronic obstructive pulmonary disease, unspecified: Secondary | ICD-10-CM | POA: Diagnosis not present

## 2016-03-31 DIAGNOSIS — R569 Unspecified convulsions: Secondary | ICD-10-CM | POA: Insufficient documentation

## 2016-04-03 DIAGNOSIS — L89620 Pressure ulcer of left heel, unstageable: Secondary | ICD-10-CM | POA: Diagnosis not present

## 2016-04-03 DIAGNOSIS — M064 Inflammatory polyarthropathy: Secondary | ICD-10-CM | POA: Diagnosis not present

## 2016-04-03 DIAGNOSIS — F0391 Unspecified dementia with behavioral disturbance: Secondary | ICD-10-CM | POA: Diagnosis not present

## 2016-04-03 DIAGNOSIS — L89612 Pressure ulcer of right heel, stage 2: Secondary | ICD-10-CM | POA: Diagnosis not present

## 2016-04-03 DIAGNOSIS — I69352 Hemiplegia and hemiparesis following cerebral infarction affecting left dominant side: Secondary | ICD-10-CM | POA: Diagnosis not present

## 2016-04-03 DIAGNOSIS — J449 Chronic obstructive pulmonary disease, unspecified: Secondary | ICD-10-CM | POA: Diagnosis not present

## 2016-04-04 DIAGNOSIS — F0391 Unspecified dementia with behavioral disturbance: Secondary | ICD-10-CM | POA: Diagnosis not present

## 2016-04-04 DIAGNOSIS — L89620 Pressure ulcer of left heel, unstageable: Secondary | ICD-10-CM | POA: Diagnosis not present

## 2016-04-04 DIAGNOSIS — L89612 Pressure ulcer of right heel, stage 2: Secondary | ICD-10-CM | POA: Diagnosis not present

## 2016-04-04 DIAGNOSIS — J449 Chronic obstructive pulmonary disease, unspecified: Secondary | ICD-10-CM | POA: Diagnosis not present

## 2016-04-04 DIAGNOSIS — I69352 Hemiplegia and hemiparesis following cerebral infarction affecting left dominant side: Secondary | ICD-10-CM | POA: Diagnosis not present

## 2016-04-04 DIAGNOSIS — M064 Inflammatory polyarthropathy: Secondary | ICD-10-CM | POA: Diagnosis not present

## 2016-04-05 ENCOUNTER — Telehealth: Payer: Self-pay | Admitting: Cardiovascular Disease

## 2016-04-05 NOTE — Telephone Encounter (Signed)
[facility] on 04/11/16 with Dr Allyson SabalBerry.  Records put with Dr Hazle CocaBerry's schedule on 04/11/16. lp

## 2016-04-07 DIAGNOSIS — F0391 Unspecified dementia with behavioral disturbance: Secondary | ICD-10-CM | POA: Diagnosis not present

## 2016-04-07 DIAGNOSIS — I69352 Hemiplegia and hemiparesis following cerebral infarction affecting left dominant side: Secondary | ICD-10-CM | POA: Diagnosis not present

## 2016-04-07 DIAGNOSIS — M064 Inflammatory polyarthropathy: Secondary | ICD-10-CM | POA: Diagnosis not present

## 2016-04-07 DIAGNOSIS — L89620 Pressure ulcer of left heel, unstageable: Secondary | ICD-10-CM | POA: Diagnosis not present

## 2016-04-07 DIAGNOSIS — J449 Chronic obstructive pulmonary disease, unspecified: Secondary | ICD-10-CM | POA: Diagnosis not present

## 2016-04-07 DIAGNOSIS — L89612 Pressure ulcer of right heel, stage 2: Secondary | ICD-10-CM | POA: Diagnosis not present

## 2016-04-10 DIAGNOSIS — L89612 Pressure ulcer of right heel, stage 2: Secondary | ICD-10-CM | POA: Diagnosis not present

## 2016-04-10 DIAGNOSIS — I69352 Hemiplegia and hemiparesis following cerebral infarction affecting left dominant side: Secondary | ICD-10-CM | POA: Diagnosis not present

## 2016-04-10 DIAGNOSIS — J449 Chronic obstructive pulmonary disease, unspecified: Secondary | ICD-10-CM | POA: Diagnosis not present

## 2016-04-10 DIAGNOSIS — F0391 Unspecified dementia with behavioral disturbance: Secondary | ICD-10-CM | POA: Diagnosis not present

## 2016-04-10 DIAGNOSIS — M064 Inflammatory polyarthropathy: Secondary | ICD-10-CM | POA: Diagnosis not present

## 2016-04-10 DIAGNOSIS — L89620 Pressure ulcer of left heel, unstageable: Secondary | ICD-10-CM | POA: Diagnosis not present

## 2016-04-11 ENCOUNTER — Ambulatory Visit (INDEPENDENT_AMBULATORY_CARE_PROVIDER_SITE_OTHER): Payer: Medicare Other | Admitting: Cardiovascular Disease

## 2016-04-11 ENCOUNTER — Encounter: Payer: Self-pay | Admitting: Cardiovascular Disease

## 2016-04-11 DIAGNOSIS — F0391 Unspecified dementia with behavioral disturbance: Secondary | ICD-10-CM | POA: Diagnosis not present

## 2016-04-11 DIAGNOSIS — M064 Inflammatory polyarthropathy: Secondary | ICD-10-CM | POA: Diagnosis not present

## 2016-04-11 DIAGNOSIS — L89612 Pressure ulcer of right heel, stage 2: Secondary | ICD-10-CM | POA: Diagnosis not present

## 2016-04-11 DIAGNOSIS — I638 Other cerebral infarction: Secondary | ICD-10-CM

## 2016-04-11 DIAGNOSIS — E7800 Pure hypercholesterolemia, unspecified: Secondary | ICD-10-CM

## 2016-04-11 DIAGNOSIS — L89620 Pressure ulcer of left heel, unstageable: Secondary | ICD-10-CM | POA: Diagnosis not present

## 2016-04-11 DIAGNOSIS — I10 Essential (primary) hypertension: Secondary | ICD-10-CM | POA: Diagnosis not present

## 2016-04-11 DIAGNOSIS — J449 Chronic obstructive pulmonary disease, unspecified: Secondary | ICD-10-CM | POA: Diagnosis not present

## 2016-04-11 DIAGNOSIS — I69352 Hemiplegia and hemiparesis following cerebral infarction affecting left dominant side: Secondary | ICD-10-CM | POA: Diagnosis not present

## 2016-04-11 NOTE — Patient Instructions (Signed)
Medication Instructions: Your physician recommends that you continue on your current medications as directed. Please refer to the Current Medication list given to you today.   Follow-Up: Your physician recommends that you schedule a follow-up appointment in: 3 months with Dr. Berry.     

## 2016-04-11 NOTE — Assessment & Plan Note (Signed)
History of hyperlipidemia on statin therapy followed by his PCP. His last cholesterol performed 12/16/15 revealed total cholesterol 100, LDL 55 and HDL 36

## 2016-04-11 NOTE — Progress Notes (Signed)
04/11/2016 Calvin Diaz   Jan 25, 1940  [id number]  Primary Physician No primary care provider on file. Primary Cardiologist: Runell GessJonathan J Nakshatra Klose MD Roseanne RenoFACP, FACC, FAHA, FSCAI  HPI:   Calvin Diaz  is a 76 year old married African-American male father of 4, grandfather and 6 grandchildren who is accompanied by his wife today. His primary care provider is Dr. Susann GivensLalonde. He is referred by Dr. Leanord Hawkingobson for peripheral vascular evaluation because of bilateral heel pressure ulcers. His factors include 50-75-pack-years of tobacco abuse having quit 8 months ago as well as history of hypertension and hyperlipidemia. He probably had a heart attack in 1990 records of that are unavailable. He's had a stroke last year which was somewhat disabling and a subsequent stroke in September after which she was discharged to a skilled nursing facility. He developed bilateral heel ulcers presumably related to pressure. He is seeing Dr. Roxan Hockeyobinson at the wound care center. Recent Dopplers performed office 03/01/16 revealed right ABI 0.95 and a left of 1.1. There is three-vessel runoff bilaterally. There was a moderate lesion in the distal right SFA. The patient is minimally ambulatory with a cane and assistance really denies claudication.     Current Outpatient Prescriptions  Medication Sig Dispense Refill  . amLODipine (NORVASC) 5 MG tablet Take 1 tablet (5 mg total) by mouth daily. 90 tablet 3  . citalopram (CELEXA) 20 MG tablet Take 1 tablet (20 mg total) by mouth daily. 30 tablet 0  . clopidogrel (PLAVIX) 75 MG tablet Take 1 tablet (75 mg total) by mouth daily. 30 tablet 11  . colchicine 0.6 MG tablet Take 1 tablet (0.6 mg total) by mouth daily. 30 tablet 0  . divalproex (DEPAKOTE SPRINKLES) 125 MG capsule Take 4 capsules (500 mg total) by mouth 2 (two) times daily. 240 capsule 11  . memantine (NAMENDA) 5 MG tablet Take 2 tablets (10 mg total) by mouth daily. 90 tablet 3  . Multiple Vitamin (MULTIVITAMIN WITH MINERALS) TABS  tablet Take 1 tablet by mouth daily.    . pravastatin (PRAVACHOL) 80 MG tablet Take 1 tablet (80 mg total) by mouth at bedtime. 90 tablet 3  . terazosin (HYTRIN) 1 MG capsule Take 1 capsule (1 mg total) by mouth at bedtime. 30 capsule 5   No current facility-administered medications for this visit.     No Known Allergies  Social History   Social History  . Marital status: Divorced    Spouse name: N/A  . Number of children: 4  . Years of education: 12   Occupational History  . Retired    Social History Main Topics  . Smoking status: Former Smoker    Types: Cigarettes  . Smokeless tobacco: Former NeurosurgeonUser    Quit date: 08/18/2015  . Alcohol use No     Comment: Previously daily  . Drug use: No  . Sexual activity: Not Currently   Other Topics Concern  . Not on file   Social History Narrative   Lives at home with his wife.   Right-handed.   No caffeine use.        Review of Systems: General: negative for chills, fever, night sweats or weight changes.  Cardiovascular: negative for chest pain, dyspnea on exertion, edema, orthopnea, palpitations, paroxysmal nocturnal dyspnea or shortness of breath Dermatological: negative for rash Respiratory: negative for cough or wheezing Urologic: negative for hematuria Abdominal: negative for nausea, vomiting, diarrhea, bright red blood per rectum, melena, or hematemesis Neurologic: negative for visual changes, syncope, or dizziness All  other systems reviewed and are otherwise negative except as noted above.    Blood pressure 107/61, pulse 83, height 5\' 11"  (1.803 m), weight 142 lb (64.4 kg).  General appearance: alert and no distress Neck: no adenopathy, no carotid bruit, no JVD, supple, symmetrical, trachea midline and thyroid not enlarged, symmetric, no tenderness/mass/nodules Lungs: clear to auscultation bilaterally Heart: regular rate and rhythm, S1, S2 normal, no murmur, click, rub or gallop and normal apical impulse Extremities:  extremities normal, atraumatic, no cyanosis or edema and Bilateral heel ulcers  EKG not performed today  ASSESSMENT AND PLAN:   Hyperlipidemia History of hyperlipidemia on statin therapy followed by his PCP. His last cholesterol performed 12/16/15 revealed total cholesterol 100, LDL 55 and HDL 36  Essential hypertension History of hypertension blood pressure measured 107/61. He is on amlodipine. Continue current measures and current dosing  Pressure injury of skin Calvin.Pedley has suffered from recent strokes and was in a skilled nursing facility. He developed bilateral heel ulcers which are treated by Dr[facility] Roxan Hockey [facility]. He did have Doppler studies performed in our office 03/01/16 revealed a right ABI 0.95 and a left of 1.1 with three-vessel runoff bilaterally. He did have a moderate lesion in his distal right SFA. He is minimally ambulatory with the aid of a cane and assistance from his wife. He denies claudication. He does have a palpable pedal pulse on the right. I do not think that his circulation is inadequate for healing. At this point, I do not see a need for angiography and potential intervention of his right SFA stenosis. I prefer to continue with aggressive local wound care. I will see him back in 3 months for follow-up      Runell Gess MD [facility], [facility] 04/11/2016 11:38 AM

## 2016-04-11 NOTE — Assessment & Plan Note (Signed)
History of hypertension blood pressure measured 107/61. He is on amlodipine. Continue current measures and current dosing

## 2016-04-11 NOTE — Assessment & Plan Note (Signed)
Calvin Diaz has suffered from recent strokes and was in a skilled nursing facility. He developed bilateral heel ulcers which are treated by Dr. Roxan Hockeyobinson at [facility]. He did have Doppler studies performed in our office 03/01/16 revealed a right ABI 0.95 and a left of 1.1 with three-vessel runoff bilaterally. He did have a moderate lesion in his distal right SFA. He is minimally ambulatory with the aid of a cane and assistance from his wife. He denies claudication. He does have a palpable pedal pulse on the right. I do not think that his circulation is inadequate for healing. At this point, I do not see a need for angiography and potential intervention of his right SFA stenosis. I prefer to continue with aggressive local wound care. I will see him back in 3 months for follow-up

## 2016-04-12 DIAGNOSIS — L89620 Pressure ulcer of left heel, unstageable: Secondary | ICD-10-CM | POA: Diagnosis not present

## 2016-04-12 DIAGNOSIS — M064 Inflammatory polyarthropathy: Secondary | ICD-10-CM | POA: Diagnosis not present

## 2016-04-12 DIAGNOSIS — F0391 Unspecified dementia with behavioral disturbance: Secondary | ICD-10-CM | POA: Diagnosis not present

## 2016-04-12 DIAGNOSIS — L89612 Pressure ulcer of right heel, stage 2: Secondary | ICD-10-CM | POA: Diagnosis not present

## 2016-04-12 DIAGNOSIS — J449 Chronic obstructive pulmonary disease, unspecified: Secondary | ICD-10-CM | POA: Diagnosis not present

## 2016-04-12 DIAGNOSIS — I69352 Hemiplegia and hemiparesis following cerebral infarction affecting left dominant side: Secondary | ICD-10-CM | POA: Diagnosis not present

## 2016-04-13 DIAGNOSIS — M064 Inflammatory polyarthropathy: Secondary | ICD-10-CM | POA: Diagnosis not present

## 2016-04-13 DIAGNOSIS — L89612 Pressure ulcer of right heel, stage 2: Secondary | ICD-10-CM | POA: Diagnosis not present

## 2016-04-13 DIAGNOSIS — L89620 Pressure ulcer of left heel, unstageable: Secondary | ICD-10-CM | POA: Diagnosis not present

## 2016-04-13 DIAGNOSIS — F0391 Unspecified dementia with behavioral disturbance: Secondary | ICD-10-CM | POA: Diagnosis not present

## 2016-04-13 DIAGNOSIS — J449 Chronic obstructive pulmonary disease, unspecified: Secondary | ICD-10-CM | POA: Diagnosis not present

## 2016-04-13 DIAGNOSIS — I69352 Hemiplegia and hemiparesis following cerebral infarction affecting left dominant side: Secondary | ICD-10-CM | POA: Diagnosis not present

## 2016-04-14 DIAGNOSIS — I481 Persistent atrial fibrillation: Secondary | ICD-10-CM | POA: Diagnosis not present

## 2016-04-14 DIAGNOSIS — L89610 Pressure ulcer of right heel, unstageable: Secondary | ICD-10-CM | POA: Diagnosis not present

## 2016-04-14 DIAGNOSIS — L89620 Pressure ulcer of left heel, unstageable: Secondary | ICD-10-CM | POA: Diagnosis not present

## 2016-04-14 DIAGNOSIS — Z8673 Personal history of transient ischemic attack (TIA), and cerebral infarction without residual deficits: Secondary | ICD-10-CM | POA: Diagnosis not present

## 2016-04-14 DIAGNOSIS — I10 Essential (primary) hypertension: Secondary | ICD-10-CM | POA: Diagnosis not present

## 2016-04-14 DIAGNOSIS — L89613 Pressure ulcer of right heel, stage 3: Secondary | ICD-10-CM | POA: Diagnosis not present

## 2016-04-14 DIAGNOSIS — Z7901 Long term (current) use of anticoagulants: Secondary | ICD-10-CM | POA: Diagnosis not present

## 2016-04-17 DIAGNOSIS — L89620 Pressure ulcer of left heel, unstageable: Secondary | ICD-10-CM | POA: Diagnosis not present

## 2016-04-17 DIAGNOSIS — L89612 Pressure ulcer of right heel, stage 2: Secondary | ICD-10-CM | POA: Diagnosis not present

## 2016-04-17 DIAGNOSIS — F0391 Unspecified dementia with behavioral disturbance: Secondary | ICD-10-CM | POA: Diagnosis not present

## 2016-04-17 DIAGNOSIS — I69352 Hemiplegia and hemiparesis following cerebral infarction affecting left dominant side: Secondary | ICD-10-CM | POA: Diagnosis not present

## 2016-04-17 DIAGNOSIS — J449 Chronic obstructive pulmonary disease, unspecified: Secondary | ICD-10-CM | POA: Diagnosis not present

## 2016-04-17 DIAGNOSIS — M064 Inflammatory polyarthropathy: Secondary | ICD-10-CM | POA: Diagnosis not present

## 2016-04-19 DIAGNOSIS — J449 Chronic obstructive pulmonary disease, unspecified: Secondary | ICD-10-CM | POA: Diagnosis not present

## 2016-04-19 DIAGNOSIS — I69352 Hemiplegia and hemiparesis following cerebral infarction affecting left dominant side: Secondary | ICD-10-CM | POA: Diagnosis not present

## 2016-04-19 DIAGNOSIS — L89620 Pressure ulcer of left heel, unstageable: Secondary | ICD-10-CM | POA: Diagnosis not present

## 2016-04-19 DIAGNOSIS — L89612 Pressure ulcer of right heel, stage 2: Secondary | ICD-10-CM | POA: Diagnosis not present

## 2016-04-19 DIAGNOSIS — F0391 Unspecified dementia with behavioral disturbance: Secondary | ICD-10-CM | POA: Diagnosis not present

## 2016-04-19 DIAGNOSIS — M064 Inflammatory polyarthropathy: Secondary | ICD-10-CM | POA: Diagnosis not present

## 2016-04-20 ENCOUNTER — Emergency Department (HOSPITAL_COMMUNITY): Payer: Medicare Other

## 2016-04-20 ENCOUNTER — Telehealth: Payer: Self-pay | Admitting: Family Medicine

## 2016-04-20 ENCOUNTER — Encounter (HOSPITAL_COMMUNITY): Payer: Self-pay

## 2016-04-20 ENCOUNTER — Observation Stay (HOSPITAL_COMMUNITY): Payer: Medicare Other

## 2016-04-20 ENCOUNTER — Inpatient Hospital Stay (HOSPITAL_COMMUNITY)
Admission: EM | Admit: 2016-04-20 | Discharge: 2016-04-25 | DRG: 065 | Disposition: A | Discharge status: Skilled Nursing Facility | Payer: Medicare Other | Attending: Internal Medicine | Admitting: Internal Medicine

## 2016-04-20 DIAGNOSIS — E876 Hypokalemia: Secondary | ICD-10-CM | POA: Diagnosis present

## 2016-04-20 DIAGNOSIS — G458 Other transient cerebral ischemic attacks and related syndromes: Secondary | ICD-10-CM

## 2016-04-20 DIAGNOSIS — Z9119 Patient's noncompliance with other medical treatment and regimen: Secondary | ICD-10-CM

## 2016-04-20 DIAGNOSIS — N3281 Overactive bladder: Secondary | ICD-10-CM | POA: Diagnosis present

## 2016-04-20 DIAGNOSIS — Z823 Family history of stroke: Secondary | ICD-10-CM

## 2016-04-20 DIAGNOSIS — R4781 Slurred speech: Secondary | ICD-10-CM | POA: Diagnosis present

## 2016-04-20 DIAGNOSIS — R4789 Other speech disturbances: Secondary | ICD-10-CM | POA: Diagnosis not present

## 2016-04-20 DIAGNOSIS — D72829 Elevated white blood cell count, unspecified: Secondary | ICD-10-CM | POA: Diagnosis present

## 2016-04-20 DIAGNOSIS — R404 Transient alteration of awareness: Secondary | ICD-10-CM | POA: Diagnosis not present

## 2016-04-20 DIAGNOSIS — I10 Essential (primary) hypertension: Secondary | ICD-10-CM | POA: Diagnosis not present

## 2016-04-20 DIAGNOSIS — Z9861 Coronary angioplasty status: Secondary | ICD-10-CM

## 2016-04-20 DIAGNOSIS — F0150 Vascular dementia without behavioral disturbance: Secondary | ICD-10-CM | POA: Diagnosis not present

## 2016-04-20 DIAGNOSIS — I639 Cerebral infarction, unspecified: Secondary | ICD-10-CM | POA: Diagnosis not present

## 2016-04-20 DIAGNOSIS — F0390 Unspecified dementia without behavioral disturbance: Secondary | ICD-10-CM | POA: Diagnosis present

## 2016-04-20 DIAGNOSIS — E785 Hyperlipidemia, unspecified: Secondary | ICD-10-CM | POA: Diagnosis present

## 2016-04-20 DIAGNOSIS — M159 Polyosteoarthritis, unspecified: Secondary | ICD-10-CM | POA: Diagnosis present

## 2016-04-20 DIAGNOSIS — I252 Old myocardial infarction: Secondary | ICD-10-CM

## 2016-04-20 DIAGNOSIS — M064 Inflammatory polyarthropathy: Secondary | ICD-10-CM | POA: Diagnosis not present

## 2016-04-20 DIAGNOSIS — I6340 Cerebral infarction due to embolism of unspecified cerebral artery: Secondary | ICD-10-CM | POA: Diagnosis not present

## 2016-04-20 DIAGNOSIS — I4891 Unspecified atrial fibrillation: Secondary | ICD-10-CM | POA: Diagnosis present

## 2016-04-20 DIAGNOSIS — I69352 Hemiplegia and hemiparesis following cerebral infarction affecting left dominant side: Secondary | ICD-10-CM | POA: Diagnosis not present

## 2016-04-20 DIAGNOSIS — I481 Persistent atrial fibrillation: Secondary | ICD-10-CM | POA: Diagnosis present

## 2016-04-20 DIAGNOSIS — Z8249 Family history of ischemic heart disease and other diseases of the circulatory system: Secondary | ICD-10-CM

## 2016-04-20 DIAGNOSIS — F0391 Unspecified dementia with behavioral disturbance: Secondary | ICD-10-CM | POA: Diagnosis not present

## 2016-04-20 DIAGNOSIS — I69354 Hemiplegia and hemiparesis following cerebral infarction affecting left non-dominant side: Secondary | ICD-10-CM | POA: Diagnosis not present

## 2016-04-20 DIAGNOSIS — M130 Polyarthritis, unspecified: Secondary | ICD-10-CM | POA: Diagnosis present

## 2016-04-20 DIAGNOSIS — L89620 Pressure ulcer of left heel, unstageable: Secondary | ICD-10-CM | POA: Diagnosis not present

## 2016-04-20 DIAGNOSIS — F5104 Psychophysiologic insomnia: Secondary | ICD-10-CM | POA: Diagnosis present

## 2016-04-20 DIAGNOSIS — M109 Gout, unspecified: Secondary | ICD-10-CM | POA: Diagnosis present

## 2016-04-20 DIAGNOSIS — R531 Weakness: Secondary | ICD-10-CM

## 2016-04-20 DIAGNOSIS — Z66 Do not resuscitate: Secondary | ICD-10-CM | POA: Diagnosis present

## 2016-04-20 DIAGNOSIS — I2510 Atherosclerotic heart disease of native coronary artery without angina pectoris: Secondary | ICD-10-CM | POA: Diagnosis present

## 2016-04-20 DIAGNOSIS — E7800 Pure hypercholesterolemia, unspecified: Secondary | ICD-10-CM | POA: Diagnosis present

## 2016-04-20 DIAGNOSIS — J449 Chronic obstructive pulmonary disease, unspecified: Secondary | ICD-10-CM | POA: Diagnosis not present

## 2016-04-20 DIAGNOSIS — L89612 Pressure ulcer of right heel, stage 2: Secondary | ICD-10-CM | POA: Diagnosis not present

## 2016-04-20 DIAGNOSIS — I6930 Unspecified sequelae of cerebral infarction: Secondary | ICD-10-CM | POA: Diagnosis not present

## 2016-04-20 DIAGNOSIS — G459 Transient cerebral ischemic attack, unspecified: Secondary | ICD-10-CM

## 2016-04-20 DIAGNOSIS — Z87891 Personal history of nicotine dependence: Secondary | ICD-10-CM

## 2016-04-20 DIAGNOSIS — M6281 Muscle weakness (generalized): Secondary | ICD-10-CM | POA: Diagnosis not present

## 2016-04-20 DIAGNOSIS — I739 Peripheral vascular disease, unspecified: Secondary | ICD-10-CM | POA: Diagnosis present

## 2016-04-20 DIAGNOSIS — Z79899 Other long term (current) drug therapy: Secondary | ICD-10-CM

## 2016-04-20 DIAGNOSIS — R509 Fever, unspecified: Secondary | ICD-10-CM | POA: Diagnosis not present

## 2016-04-20 DIAGNOSIS — Z7901 Long term (current) use of anticoagulants: Secondary | ICD-10-CM

## 2016-04-20 LAB — URINALYSIS, ROUTINE W REFLEX MICROSCOPIC
Bilirubin Urine: NEGATIVE
Glucose, UA: NEGATIVE mg/dL
Hgb urine dipstick: NEGATIVE
KETONES UR: NEGATIVE mg/dL
Leukocytes, UA: NEGATIVE
NITRITE: NEGATIVE
PH: 5 (ref 5.0–8.0)
Protein, ur: NEGATIVE mg/dL
Specific Gravity, Urine: 1.021 (ref 1.005–1.030)

## 2016-04-20 LAB — CBC
HEMATOCRIT: 36.8 % — AB (ref 39.0–52.0)
HEMOGLOBIN: 11.9 g/dL — AB (ref 13.0–17.0)
MCH: 29.2 pg (ref 26.0–34.0)
MCHC: 32.3 g/dL (ref 30.0–36.0)
MCV: 90.4 fL (ref 78.0–100.0)
Platelets: 233 10*3/uL (ref 150–400)
RBC: 4.07 MIL/uL — AB (ref 4.22–5.81)
RDW: 14.6 % (ref 11.5–15.5)
WBC: 10.7 10*3/uL — AB (ref 4.0–10.5)

## 2016-04-20 LAB — COMPREHENSIVE METABOLIC PANEL
ALK PHOS: 50 U/L (ref 38–126)
ALT: 11 U/L — AB (ref 17–63)
AST: 28 U/L (ref 15–41)
Albumin: 3.6 g/dL (ref 3.5–5.0)
Anion gap: 10 (ref 5–15)
BUN: 14 mg/dL (ref 6–20)
CALCIUM: 9.1 mg/dL (ref 8.9–10.3)
CO2: 27 mmol/L (ref 22–32)
CREATININE: 1.04 mg/dL (ref 0.61–1.24)
Chloride: 105 mmol/L (ref 101–111)
Glucose, Bld: 124 mg/dL — ABNORMAL HIGH (ref 65–99)
Potassium: 3.7 mmol/L (ref 3.5–5.1)
Sodium: 142 mmol/L (ref 135–145)
Total Bilirubin: 0.5 mg/dL (ref 0.3–1.2)
Total Protein: 7.5 g/dL (ref 6.5–8.1)

## 2016-04-20 LAB — I-STAT TROPONIN, ED: TROPONIN I, POC: 0 ng/mL (ref 0.00–0.08)

## 2016-04-20 LAB — I-STAT CG4 LACTIC ACID, ED
LACTIC ACID, VENOUS: 0.65 mmol/L (ref 0.5–1.9)
LACTIC ACID, VENOUS: 0.83 mmol/L (ref 0.5–1.9)
Lactic Acid, Venous: 1.32 mmol/L (ref 0.5–1.9)

## 2016-04-20 LAB — VALPROIC ACID LEVEL: Valproic Acid Lvl: 31 ug/mL — ABNORMAL LOW (ref 50.0–100.0)

## 2016-04-20 MED ORDER — CITALOPRAM HYDROBROMIDE 10 MG PO TABS
20.0000 mg | ORAL_TABLET | Freq: Every day | ORAL | Status: DC
Start: 2016-04-21 — End: 2016-04-25
  Administered 2016-04-21 – 2016-04-25 (×5): 20 mg via ORAL
  Filled 2016-04-20 (×5): qty 2

## 2016-04-20 MED ORDER — DIVALPROEX SODIUM 125 MG PO CSDR
500.0000 mg | DELAYED_RELEASE_CAPSULE | Freq: Two times a day (BID) | ORAL | Status: DC
Start: 2016-04-20 — End: 2016-04-25
  Administered 2016-04-21 – 2016-04-25 (×10): 500 mg via ORAL
  Filled 2016-04-20 (×11): qty 4

## 2016-04-20 MED ORDER — COLCHICINE 0.6 MG PO TABS
0.6000 mg | ORAL_TABLET | Freq: Every day | ORAL | Status: DC
  Administered 2016-04-21 – 2016-04-25 (×5): 0.6 mg via ORAL
  Filled 2016-04-20 (×5): qty 1

## 2016-04-20 MED ORDER — DEXTROSE-NACL 5-0.9 % IV SOLN: INTRAVENOUS | Status: DC

## 2016-04-20 MED ORDER — AMLODIPINE BESYLATE 5 MG PO TABS
5.0000 mg | ORAL_TABLET | Freq: Every day | ORAL | Status: DC
  Administered 2016-04-21 – 2016-04-22 (×2): 5 mg via ORAL
  Filled 2016-04-20 (×3): qty 1

## 2016-04-20 MED ORDER — ACETAMINOPHEN 650 MG RE SUPP: 650.0000 mg | Freq: Four times a day (QID) | RECTAL | Status: DC | PRN

## 2016-04-20 MED ORDER — MEMANTINE HCL 10 MG PO TABS
10.0000 mg | ORAL_TABLET | Freq: Every day | ORAL | Status: DC
  Administered 2016-04-21 – 2016-04-25 (×5): 10 mg via ORAL
  Filled 2016-04-20 (×5): qty 1

## 2016-04-20 MED ORDER — TERAZOSIN HCL 1 MG PO CAPS
1.0000 mg | ORAL_CAPSULE | Freq: Every day | ORAL | Status: DC
Start: 2016-04-20 — End: 2016-04-25
  Administered 2016-04-21 – 2016-04-24 (×5): 1 mg via ORAL
  Filled 2016-04-20 (×6): qty 1

## 2016-04-20 MED ORDER — RIVAROXABAN 20 MG PO TABS
20.0000 mg | ORAL_TABLET | Freq: Every day | ORAL | Status: DC
  Administered 2016-04-20 – 2016-04-21 (×2): 20 mg via ORAL
  Filled 2016-04-20 (×2): qty 1

## 2016-04-20 MED ORDER — PRAVASTATIN SODIUM 40 MG PO TABS
80.0000 mg | ORAL_TABLET | Freq: Every day | ORAL | Status: DC
Start: 2016-04-20 — End: 2016-04-25
  Administered 2016-04-21 – 2016-04-24 (×5): 80 mg via ORAL
  Filled 2016-04-20 (×5): qty 2

## 2016-04-20 MED ORDER — CLOPIDOGREL BISULFATE 75 MG PO TABS
75.0000 mg | ORAL_TABLET | Freq: Every day | ORAL | Status: DC
  Administered 2016-04-21 – 2016-04-25 (×5): 75 mg via ORAL
  Filled 2016-04-20 (×5): qty 1

## 2016-04-20 MED ORDER — ACETAMINOPHEN 325 MG PO TABS: 650.0000 mg | ORAL_TABLET | Freq: Four times a day (QID) | ORAL | Status: DC | PRN

## 2016-04-20 MED ORDER — SODIUM CHLORIDE 0.9 % IV SOLN: INTRAVENOUS | Status: DC

## 2016-04-20 MED ORDER — GADOBENATE DIMEGLUMINE 529 MG/ML IV SOLN
14.0000 mL | Freq: Once | INTRAVENOUS | Status: AC | PRN
  Administered 2016-04-20: 14 mL via INTRAVENOUS

## 2016-04-20 MED ORDER — SODIUM CHLORIDE 0.9% FLUSH
3.0000 mL | Freq: Two times a day (BID) | INTRAVENOUS | Status: DC
  Administered 2016-04-21 – 2016-04-25 (×9): 3 mL via INTRAVENOUS

## 2016-04-20 NOTE — H&P (Signed)
History and Physical    Calvin Diaz WGN:[id number]:[id number] DOB: [birth year] DOA: 04/20/2016  [facility]  Patient coming from: Home  Chief Complaint: Slurred speech  HPI: Calvin Diaz is a 76 y.o. 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 Patient was seen by Neurology who recommended continued xarelto on discharge. Chart reviewed. On follow up with Neurology on 03/30/16, patient was found to be no longer taking xarelto secondary to cost. Patient returns today with complaints of worsened slurred speech starting 7-8pm on 3/28 with generalized weakness.  ED Course: In the ED, CT head was obtained which demonstrated no acute pathology. Old infarcts are noted. Patient remained with slurred speech. Given concerns of CVA, hospitalist consulted for further work up.  Review of Systems:  Review of Systems  Constitutional: Positive for malaise/fatigue. Negative for chills and fever.  HENT: Negative for congestion, ear discharge and nosebleeds.   Eyes: Negative for blurred vision, double vision and photophobia.  Respiratory: Negative for sputum production, shortness of breath and wheezing.   Cardiovascular: Negative for palpitations, orthopnea and claudication.  Gastrointestinal: Negative for abdominal pain, nausea and vomiting.  Genitourinary: Negative for flank pain, frequency and hematuria.  Musculoskeletal: Negative for back pain, joint pain and neck pain.  Neurological: Positive for weakness. Negative for tingling and headaches.       Slurred speech  Psychiatric/Behavioral: Negative for hallucinations. The patient is not nervous/anxious and does not have insomnia.     Past Medical History:  Diagnosis Date  . Dementia   . High cholesterol   .  Hypertension   . Myocardial infarction   . Stroke [facility](HCC)     Past Surgical History:  Procedure Laterality Date  . CORONARY ANGIOPLASTY       reports that he has quit smoking. His smoking use included Cigarettes. He quit smokeless tobacco use about 8 months ago. He reports that he does not drink alcohol or use drugs.  No Known Allergies  Family History  Problem Relation Age of Onset  . Arrhythmia Mother     Had pacemaker  . Stroke Mother   . Stroke Maternal Aunt   . Heart attack Father     Prior to Admission medications   Medication Sig Start Date End Date Taking? Authorizing Provider  amLODipine (NORVASC) 5 MG tablet Take 1 tablet (5 mg total) by mouth daily. 02/01/16  Yes Janetta HoraKathryn R Thompson, PA-C  citalopram (CELEXA) 20 MG tablet Take 1 tablet (20 mg total) by mouth daily. 02/23/16  Yes Ronnald NianJohn C Lalonde, MD  clopidogrel (PLAVIX) 75 MG tablet Take 1 tablet (75 mg total) by mouth daily. 03/30/16  Yes Levert FeinsteinYijun Yan, MD  colchicine 0.6 MG tablet Take 1 tablet (0.6 mg total) by mouth daily. 12/21/15  Yes Jennifer Chahn-Yang Choi, DO  divalproex (DEPAKOTE SPRINKLES) 125 MG capsule Take 4 capsules (500 mg total) by mouth 2 (two) times daily. 03/30/16  Yes Levert FeinsteinYijun Yan, MD  memantine (NAMENDA) 5 MG tablet Take 2 tablets (10 mg total) by mouth daily. 09/15/15  Yes Ronnald NianJohn C Lalonde, MD  Multiple Vitamin (MULTIVITAMIN WITH MINERALS) TABS tablet Take 1 tablet by mouth daily.   Yes Historical Provider, MD  pravastatin (PRAVACHOL) 80 MG tablet Take 1 tablet (80 mg total) by mouth at bedtime. 12/07/15  Yes Janetta HoraKathryn R Thompson,  PA-C  terazosin (HYTRIN) 1 MG capsule Take 1 capsule (1 mg total) by mouth at bedtime. 11/04/15  Yes Ronnald Nian, MD    Physical Exam: Vitals:   04/20/16 1325 04/20/16 1327 04/20/16 1400 04/20/16 1430  BP:   (!) 126/59 134/63  Pulse:   90 94  Resp:   (!) 22 19  SpO2: 95%  100% 100%  Weight:  64.4 kg (142 lb)    Height:  5\' 11"  (1.803 m)      Constitutional: NAD, calm,  comfortable Vitals:   04/20/16 1325 04/20/16 1327 04/20/16 1400 04/20/16 1430  BP:   (!) 126/59 134/63  Pulse:   90 94  Resp:   (!) 22 19  SpO2: 95%  100% 100%  Weight:  64.4 kg (142 lb)    Height:  5\' 11"  (1.803 m)     Eyes: PERRL, lids and conjunctivae normal ENMT: Mucous membranes are moist. Posterior pharynx clear of any exudate or lesions.poor dentition.  Neck: normal, supple, no masses, no thyromegaly Respiratory: clear to auscultation bilaterally, no wheezing, no crackles. Normal respiratory effort. No accessory muscle use.  Cardiovascular: Regular rate and rhythm Abdomen: no tenderness, no masses palpated. No hepatosplenomegaly. Bowel sounds positive.  Musculoskeletal: no clubbing / cyanosis. No joint deformity upper and lower extremities. Good ROM, no contractures. Normal muscle tone.  Skin: no rashes, lesions, ulcers. No induration Neurologic: CN 2-12 grossly intact. Slurred speech, LUE and LLE with 1/5 strength, RLE with 2-3/5 strength, RUE with 4/5 strength.  Psychiatric: mood appears normal/ no visual hallucinations   Labs on Admission: I have personally reviewed following labs and imaging studies  CBC:  Recent Labs Lab 04/20/16 1436  WBC 10.7*  HGB 11.9*  HCT 36.8*  MCV 90.4  PLT 233   Basic Metabolic Panel:  Recent Labs Lab 04/20/16 1436  NA 142  K 3.7  CL 105  CO2 27  GLUCOSE 124*  BUN 14  CREATININE 1.04  CALCIUM 9.1   GFR: Estimated Creatinine Clearance: 55.9 mL/min (by C-G formula based on SCr of 1.04 mg/dL). Liver Function Tests:  Recent Labs Lab 04/20/16 1436  AST 28  ALT 11*  ALKPHOS 50  BILITOT 0.5  PROT 7.5  ALBUMIN 3.6   No results for input(s): LIPASE, AMYLASE in the last 168 hours. No results for input(s): AMMONIA in the last 168 hours. Coagulation Profile: No results for input(s): INR, PROTIME in the last 168 hours. Cardiac Enzymes: No results for input(s): CKTOTAL, CKMB, CKMBINDEX, TROPONINI in the last 168 hours. BNP  (last 3 results) No results for input(s): PROBNP in the last 8760 hours. HbA1C: No results for input(s): HGBA1C in the last 72 hours. CBG: No results for input(s): GLUCAP in the last 168 hours. Lipid Profile: No results for input(s): CHOL, HDL, LDLCALC, TRIG, CHOLHDL, LDLDIRECT in the last 72 hours. Thyroid Function Tests: No results for input(s): TSH, T4TOTAL, FREET4, T3FREE, THYROIDAB in the last 72 hours. Anemia Panel: No results for input(s): VITAMINB12, FOLATE, FERRITIN, TIBC, IRON, RETICCTPCT in the last 72 hours. Urine analysis:    Component Value Date/Time   COLORURINE AMBER (A) 12/13/2015 1602   APPEARANCEUR CLEAR 12/13/2015 1602   LABSPEC 1.023 12/13/2015 1602   PHURINE 6.0 12/13/2015 1602   GLUCOSEU NEGATIVE 12/13/2015 1602   HGBUR NEGATIVE 12/13/2015 1602   BILIRUBINUR SMALL (A) 12/13/2015 1602   KETONESUR NEGATIVE 12/13/2015 1602   PROTEINUR 30 (A) 12/13/2015 1602   NITRITE NEGATIVE 12/13/2015 1602   LEUKOCYTESUR NEGATIVE 12/13/2015 1602  Sepsis Labs: !!!!!!!!!!!!!!!!!!!!!!!!!!!!!!!!!!!!!!!!!!!! @LABRCNTIP (procalcitonin:4,lacticidven:4) )No results found for this or any previous visit (from the past 240 hour(s)).   Radiological Exams on Admission: Ct Head Wo Contrast  Result Date: 04/20/2016 CLINICAL DATA:  Slurred speech. EXAM: CT HEAD WITHOUT CONTRAST TECHNIQUE: Contiguous axial images were obtained from the base of the skull through the vertex without intravenous contrast. COMPARISON:  CT scan of February 06, 2016. FINDINGS: Brain: Stable encephalomalacia is seen involving the right MCA territory consistent with old infarction. Stable left frontal encephalomalacia is noted consistent with old infarction. No mass effect or midline shift is noted. Ventricular size is within normal limits. There is no evidence of mass lesion, hemorrhage or acute infarction. Vascular: No hyperdense vessel or unexpected calcification. Skull: Normal. Negative for fracture or focal lesion.  Sinuses/Orbits: No acute finding. Other: None. IMPRESSION: Old bilateral cerebral infarctions. No acute intracranial abnormality seen. Electronically Signed   By: Lupita Raider, M.D.   On: 04/20/2016 15:40   Head CT reviewed. No acute pathology  Assessment/Plan Active Problems:   History of CVA with residual deficit   Hyperlipidemia   Essential hypertension   Atrial fibrillation (HCC)   Dementia   CVA (cerebral vascular accident) (HCC)   Leukocytosis   Polyarticular arthritis   1. Slurred speech 1. Worsened overnight, persistent 2. Initial head CT without acute process 3. Patient had not been taking anticoagulation secondary to cost 4. Will continue xarelto for now. Will consult case manager for medication asstance 2. Hx CVA 1. Will continue plavix 2. Will resume xarelto for now, as recommended by Neurology at last hospitalization 3. Will obtain head MRI to r/o acute infarct. If positive, would formally consult Neurology at that time 4. Consult PT/OT/SLP 3. HLD 1. Continue statin per home regimen 4. Afib 1. Rate controlled at present 2. Will resume xarelto for now. Consult case manager for medication assistance 5. Dementia 1. Seems stable at present 6. Arthritis 1. Currently stable  DVT prophylaxis: Xarelto  Code Status: DNR, confirmed with wife at bedside Family Communication: Pt in room  Disposition Plan: Uncertain at this time  Consults called:  Admission status: observation as pt would likely require 2 midnight stay   CHIU, Scheryl Marten MD [facility] Pager [phone]  If 7PM-7AM, please contact night-coverage [email or web address] Password [facility]  04/20/2016, 4:26 PM

## 2016-04-20 NOTE — ED Notes (Signed)
Admitting at bedside 

## 2016-04-20 NOTE — ED Notes (Signed)
Pt's family stated that the only way to obtain urine is doing an in and out cath. Informed Erin - RN.

## 2016-04-20 NOTE — ED Notes (Signed)
Patient transported to MRI 

## 2016-04-20 NOTE — ED Notes (Signed)
Informed Dr. Steinl andDenton Lank Denny PeonErin - RN of pt's oral temp.

## 2016-04-20 NOTE — Telephone Encounter (Signed)
Pt, wife, [name] called, stating that pt is clammy, shaky and getting weaker and weaker. She wants to bring him in to be seen asap  We so not have any open appointment so after discussing pt's issue with Lafonda MossesDiana, advised wife to take pt to ER given his medical history.

## 2016-04-20 NOTE — ED Notes (Signed)
IV team at bedside 

## 2016-04-20 NOTE — ED Notes (Signed)
Wife would like to be contacted when patient receives bed assignment.

## 2016-04-20 NOTE — ED Provider Notes (Addendum)
[facility]-EMERGENCY DEPT Provider Note   CSN: [id number] Arrival date & time: 04/20/16  1325     History   Chief Complaint Chief Complaint  Patient presents with  . Weakness    HPI Calvin Diaz is a 76 y.o. male.  Patient with hx htn, cva w residual left weakness, noted to have increased generalized weakness, and slurred speech since last night. Symptoms have been constant, persistent. Pt with hx dementia, difficult historian - Level 5 caveat.  Patient  states he feels fine.  Pt denies headache. No chest pain or sob. No cough or uri c/o. No fever or chills. Pt denies abd pain or nvd. No dysuria or gu c/o. Family subsequently arrives - states symptoms onset 7:30-8 last night, better now but not back to normal.    The history is provided by the patient, the EMS personnel and the spouse. The history is limited by the condition of the patient.  Weakness     Past Medical History:  Diagnosis Date  . Dementia   . High cholesterol   . Hypertension   . Myocardial infarction   . Stroke [facility])     Patient Active Problem List   Diagnosis Date Noted  . Seizures (HCC) 03/31/2016  . Pressure injury of skin 12/20/2015  . Elbow swelling, left   . Left elbow pain   . Delirium   . Dysphagia, post-stroke   . Tachycardia   . Leukocytosis   . Acute blood loss anemia   . Polyarticular arthritis   . Swelling   . CVA (cerebral vascular accident) (HCC) 12/14/2015  . Hypokalemia 12/14/2015  . Fever 12/13/2015  . OAB (overactive bladder) 09/20/2015  . Essential hypertension 09/07/2015  . Atrial fibrillation (HCC) 09/07/2015  . Dementia 09/07/2015  . History of CVA with residual deficit 08/18/2015  . Former smoker 08/18/2015  . History of alcohol abuse 08/18/2015  . Hyperlipidemia 08/18/2015    Past Surgical History:  Procedure Laterality Date  . CORONARY ANGIOPLASTY         Home Medications    Prior to Admission medications   Medication Sig Start Date End Date Taking?  Authorizing Provider  amLODipine (NORVASC) 5 MG tablet Take 1 tablet (5 mg total) by mouth daily. 02/01/16   Janetta Hora, PA-C  citalopram (CELEXA) 20 MG tablet Take 1 tablet (20 mg total) by mouth daily. 02/23/16   Ronnald Nian, MD  clopidogrel (PLAVIX) 75 MG tablet Take 1 tablet (75 mg total) by mouth daily. 03/30/16   Levert Feinstein, MD  colchicine 0.6 MG tablet Take 1 tablet (0.6 mg total) by mouth daily. 12/21/15   Carlton Adam Choi, DO  divalproex (DEPAKOTE SPRINKLES) 125 MG capsule Take 4 capsules (500 mg total) by mouth 2 (two) times daily. 03/30/16   Levert Feinstein, MD  memantine (NAMENDA) 5 MG tablet Take 2 tablets (10 mg total) by mouth daily. 09/15/15   Ronnald Nian, MD  Multiple Vitamin (MULTIVITAMIN WITH MINERALS) TABS tablet Take 1 tablet by mouth daily.    Historical Provider, MD  pravastatin (PRAVACHOL) 80 MG tablet Take 1 tablet (80 mg total) by mouth at bedtime. 12/07/15   Janetta Hora, PA-C  terazosin (HYTRIN) 1 MG capsule Take 1 capsule (1 mg total) by mouth at bedtime. 11/04/15   Ronnald Nian, MD    Family History Family History  Problem Relation Age of Onset  . Arrhythmia Mother     Had pacemaker  . Stroke Mother   . Stroke Maternal  Aunt   . Heart attack Father     Social History Social History  Substance Use Topics  . Smoking status: Former Smoker    Types: Cigarettes  . Smokeless tobacco: Former Neurosurgeon    Quit date: 08/18/2015  . Alcohol use No     Comment: Previously daily     Allergies   Patient has no known allergies.   Review of Systems Review of Systems  Unable to perform ROS: Dementia  Neurological: Positive for weakness.  level 5 caveat   Physical Exam Updated Vital Signs BP 134/63   Pulse 94   Resp 19   Ht 5\' 11"  (1.803 m)   Wt 64.4 kg   SpO2 100%   BMI 19.80 kg/m   Physical Exam  Constitutional: He appears well-developed and well-nourished. No distress.  HENT:  Head: Atraumatic.  Mouth/Throat: Oropharynx is clear and  moist.  Eyes: Conjunctivae are normal. Pupils are equal, round, and reactive to light.  Neck: Neck supple. No tracheal deviation present.  No bruits  Cardiovascular: Normal rate, regular rhythm, normal heart sounds and intact distal pulses.  Exam reveals no gallop and no friction rub.   No murmur heard. Pulmonary/Chest: Effort normal and breath sounds normal. No accessory muscle usage. No respiratory distress.  Abdominal: Soft. Bowel sounds are normal. He exhibits no distension. There is no tenderness.  Genitourinary:  Genitourinary Comments: No cva tenderness  Musculoskeletal: He exhibits no edema.  Neurological: He is alert. No cranial nerve deficit.  Speech not grossly dysarthric. Left weakness - pt states c/w baseline.   Skin: Skin is warm and dry. No rash noted. He is not diaphoretic.  Psychiatric: He has a normal mood and affect.  Nursing note and vitals reviewed.    ED Treatments / Results  Labs (all labs ordered are listed, but only abnormal results are displayed) Results for orders placed or performed during the hospital encounter of 04/20/16  CBC  Result Value Ref Range   WBC 10.7 (H) 4.0 - 10.5 K/uL   RBC 4.07 (L) 4.22 - 5.81 MIL/uL   Hemoglobin 11.9 (L) 13.0 - 17.0 g/dL   HCT 16.1 (L) 09.6 - 04.5 %   MCV 90.4 78.0 - 100.0 fL   MCH 29.2 26.0 - 34.0 pg   MCHC 32.3 30.0 - 36.0 g/dL   RDW 40.9 81.1 - 91.4 %   Platelets 233 150 - 400 K/uL  Comprehensive metabolic panel  Result Value Ref Range   Sodium 142 135 - 145 mmol/L   Potassium 3.7 3.5 - 5.1 mmol/L   Chloride 105 101 - 111 mmol/L   CO2 27 22 - 32 mmol/L   Glucose, Bld 124 (H) 65 - 99 mg/dL   BUN 14 6 - 20 mg/dL   Creatinine, Ser 7.82 0.61 - 1.24 mg/dL   Calcium 9.1 8.9 - 95.6 mg/dL   Total Protein 7.5 6.5 - 8.1 g/dL   Albumin 3.6 3.5 - 5.0 g/dL   AST 28 15 - 41 U/L   ALT 11 (L) 17 - 63 U/L   Alkaline Phosphatase 50 38 - 126 U/L   Total Bilirubin 0.5 0.3 - 1.2 mg/dL   GFR calc non Af Amer >60 >60 mL/min     GFR calc Af Amer >60 >60 mL/min   Anion gap 10 5 - 15  Valproic acid level  Result Value Ref Range   Valproic Acid Lvl 31 (L) 50.0 - 100.0 ug/mL  I-stat troponin, ED  Result Value Ref Range  Troponin i, poc 0.00 0.00 - 0.08 ng/mL   Comment 3           Ct Head Wo Contrast  Result Date: 04/20/2016 CLINICAL DATA:  Slurred speech. EXAM: CT HEAD WITHOUT CONTRAST TECHNIQUE: Contiguous axial images were obtained from the base of the skull through the vertex without intravenous contrast. COMPARISON:  CT scan of February 06, 2016. FINDINGS: Brain: Stable encephalomalacia is seen involving the right MCA territory consistent with old infarction. Stable left frontal encephalomalacia is noted consistent with old infarction. No mass effect or midline shift is noted. Ventricular size is within normal limits. There is no evidence of mass lesion, hemorrhage or acute infarction. Vascular: No hyperdense vessel or unexpected calcification. Skull: Normal. Negative for fracture or focal lesion. Sinuses/Orbits: No acute finding. Other: None. IMPRESSION: Old bilateral cerebral infarctions. No acute intracranial abnormality seen. Electronically Signed   By: Lupita Raider, M.D.   On: 04/20/2016 15:40    EKG  EKG Interpretation  Date/Time:  Thursday April 20 2016 14:38:05 EDT Ventricular Rate:  97 PR Interval:    QRS Duration: 93 QT Interval:  330 QTC Calculation: 420 R Axis:   68 Text Interpretation:  Sinus rhythm Ventricular premature complex Prolonged PR interval Confirmed by Denton Lank  MD, Caryn Bee ([id number]) on 04/20/2016 4:03:37 PM       Radiology Ct Head Wo Contrast  Result Date: 04/20/2016 CLINICAL DATA:  Slurred speech. EXAM: CT HEAD WITHOUT CONTRAST TECHNIQUE: Contiguous axial images were obtained from the base of the skull through the vertex without intravenous contrast. COMPARISON:  CT scan of February 06, 2016. FINDINGS: Brain: Stable encephalomalacia is seen involving the right MCA territory consistent  with old infarction. Stable left frontal encephalomalacia is noted consistent with old infarction. No mass effect or midline shift is noted. Ventricular size is within normal limits. There is no evidence of mass lesion, hemorrhage or acute infarction. Vascular: No hyperdense vessel or unexpected calcification. Skull: Normal. Negative for fracture or focal lesion. Sinuses/Orbits: No acute finding. Other: None. IMPRESSION: Old bilateral cerebral infarctions. No acute intracranial abnormality seen. Electronically Signed   By: Lupita Raider, M.D.   On: 04/20/2016 15:40    Procedures Procedures (including critical care time)  Medications Ordered in ED Medications  0.9 %  sodium chloride infusion (not administered)     Initial Impression / Assessment and Plan / ED Course  I have reviewed the triage vital signs and the nursing notes.  Pertinent labs & imaging results that were available during my care of the patient were reviewed by me and considered in my medical decision making (see chart for details).  Iv ns. Labs. ecg. Ct. ecg. Cardiac monitoring. Stat head ct.   Reviewed nursing notes and prior charts for additional history.   Given new onset slurred speech, increased weakness, will admit for cva workup.  Hospitalists consulted for admission.  Reviewed nursing notes and prior charts for additional history.   Recheck pt, no change in exam from prior.   Post admitting to hospitalists, tech indicates pt now w fever.  ua is pending - nursing to send.  cxr added to workup.  Blood cultures added.  No new c/o. No headache. No neck stiffness or rigidity. No abd pain, abd soft nt.  Admitting team to f/u on start of fever workup, and tx appropriately.    Final Clinical Impressions(s) / ED Diagnoses   Final diagnoses:  None    New Prescriptions New Prescriptions   No medications on file  Cathren LaineKevin Kristoph Sattler, MD 04/20/16 [phone]

## 2016-04-20 NOTE — ED Notes (Signed)
RN attempted IV 2x unsuccessfully. IV team consulted.  

## 2016-04-20 NOTE — ED Notes (Signed)
Attempted report x1. 

## 2016-04-20 NOTE — ED Notes (Signed)
Pt. Unable to void thus far. External urinary catheter placed for patient.

## 2016-04-20 NOTE — ED Triage Notes (Signed)
Pt. Coming from home via [facility] for generalized weakness and slurred speech since last night. Pt. Hx of CVA with residual left sided deficits. Wife reports onset of slurred speech and generalized weakness since last night, but worse this morning. Pt. Hx of dementia as well. Pt. Unable to ambulate, whereas before he could. EMS reports 12 lead unremarkable.

## 2016-04-20 NOTE — ED Notes (Signed)
Pt given a urinal.

## 2016-04-20 NOTE — ED Notes (Signed)
Patient transported to CT 

## 2016-04-20 NOTE — Progress Notes (Signed)
ANTICOAGULATION CONSULT NOTE - Initial Consult  Pharmacy Consult for xarelto Indication: atrial fibrillation  No Known Allergies  Patient Measurements: Height: 5\' 11"  (180.3 cm) Weight: 142 lb (64.4 kg) IBW/kg (Calculated) : 75.3  Vital Signs: Temp: 101 F (38.3 C) (03/29 1639) Temp Source: Oral (03/29 1639) BP: 128/63 (03/29 1639) Pulse Rate: 90 (03/29 1639)  Labs:  Recent Labs  04/20/16 1436  HGB 11.9*  HCT 36.8*  PLT 233  CREATININE 1.04    Estimated Creatinine Clearance: 55.9 mL/min (by C-G formula based on SCr of 1.04 mg/dL).   Medical History: Past Medical History:  Diagnosis Date  . Dementia   . High cholesterol   . Hypertension   . Myocardial infarction   . Stroke [facility](HCC)     Assessment: [age] yom presented with slurred speech. He was supposed to be on xarelto for afib but had stopped taking due to cost. Attempting to restart today. H/H is slightly low and platelets are WNL. Scr is WNL.  Goal of Therapy:  Stroke prevention Monitor platelets by anticoagulation protocol: Yes   Plan:  Xarelto 20mg  PO daily F/u renal function, S&S of bleeding *Cost issue will need to be addressed prior to discharge  Sussan Meter, Drake LeachRachel Lynn 04/20/2016,5:06 PM

## 2016-04-21 DIAGNOSIS — Z7901 Long term (current) use of anticoagulants: Secondary | ICD-10-CM | POA: Diagnosis not present

## 2016-04-21 DIAGNOSIS — G9389 Other specified disorders of brain: Secondary | ICD-10-CM | POA: Diagnosis not present

## 2016-04-21 DIAGNOSIS — Z8249 Family history of ischemic heart disease and other diseases of the circulatory system: Secondary | ICD-10-CM | POA: Diagnosis not present

## 2016-04-21 DIAGNOSIS — E785 Hyperlipidemia, unspecified: Secondary | ICD-10-CM | POA: Diagnosis present

## 2016-04-21 DIAGNOSIS — I252 Old myocardial infarction: Secondary | ICD-10-CM | POA: Diagnosis not present

## 2016-04-21 DIAGNOSIS — I6340 Cerebral infarction due to embolism of unspecified cerebral artery: Secondary | ICD-10-CM | POA: Diagnosis present

## 2016-04-21 DIAGNOSIS — Z9861 Coronary angioplasty status: Secondary | ICD-10-CM | POA: Diagnosis not present

## 2016-04-21 DIAGNOSIS — I69328 Other speech and language deficits following cerebral infarction: Secondary | ICD-10-CM | POA: Diagnosis not present

## 2016-04-21 DIAGNOSIS — F329 Major depressive disorder, single episode, unspecified: Secondary | ICD-10-CM | POA: Diagnosis not present

## 2016-04-21 DIAGNOSIS — Z8673 Personal history of transient ischemic attack (TIA), and cerebral infarction without residual deficits: Secondary | ICD-10-CM | POA: Diagnosis not present

## 2016-04-21 DIAGNOSIS — I69354 Hemiplegia and hemiparesis following cerebral infarction affecting left non-dominant side: Secondary | ICD-10-CM | POA: Diagnosis not present

## 2016-04-21 DIAGNOSIS — D72829 Elevated white blood cell count, unspecified: Secondary | ICD-10-CM | POA: Diagnosis present

## 2016-04-21 DIAGNOSIS — I6789 Other cerebrovascular disease: Secondary | ICD-10-CM | POA: Diagnosis not present

## 2016-04-21 DIAGNOSIS — F0390 Unspecified dementia without behavioral disturbance: Secondary | ICD-10-CM | POA: Diagnosis present

## 2016-04-21 DIAGNOSIS — Z66 Do not resuscitate: Secondary | ICD-10-CM | POA: Diagnosis present

## 2016-04-21 DIAGNOSIS — F0150 Vascular dementia without behavioral disturbance: Secondary | ICD-10-CM | POA: Diagnosis not present

## 2016-04-21 DIAGNOSIS — E876 Hypokalemia: Secondary | ICD-10-CM | POA: Diagnosis present

## 2016-04-21 DIAGNOSIS — M1380 Other specified arthritis, unspecified site: Secondary | ICD-10-CM | POA: Diagnosis not present

## 2016-04-21 DIAGNOSIS — I481 Persistent atrial fibrillation: Secondary | ICD-10-CM

## 2016-04-21 DIAGNOSIS — M159 Polyosteoarthritis, unspecified: Secondary | ICD-10-CM | POA: Diagnosis present

## 2016-04-21 DIAGNOSIS — M6281 Muscle weakness (generalized): Secondary | ICD-10-CM | POA: Diagnosis not present

## 2016-04-21 DIAGNOSIS — I6300 Cerebral infarction due to thrombosis of unspecified precerebral artery: Secondary | ICD-10-CM | POA: Diagnosis not present

## 2016-04-21 DIAGNOSIS — Z87891 Personal history of nicotine dependence: Secondary | ICD-10-CM | POA: Diagnosis not present

## 2016-04-21 DIAGNOSIS — Z9119 Patient's noncompliance with other medical treatment and regimen: Secondary | ICD-10-CM | POA: Diagnosis not present

## 2016-04-21 DIAGNOSIS — I639 Cerebral infarction, unspecified: Secondary | ICD-10-CM | POA: Diagnosis present

## 2016-04-21 DIAGNOSIS — I2510 Atherosclerotic heart disease of native coronary artery without angina pectoris: Secondary | ICD-10-CM | POA: Diagnosis present

## 2016-04-21 DIAGNOSIS — R509 Fever, unspecified: Secondary | ICD-10-CM | POA: Diagnosis not present

## 2016-04-21 DIAGNOSIS — M109 Gout, unspecified: Secondary | ICD-10-CM | POA: Diagnosis present

## 2016-04-21 DIAGNOSIS — I10 Essential (primary) hypertension: Secondary | ICD-10-CM | POA: Diagnosis not present

## 2016-04-21 DIAGNOSIS — N3281 Overactive bladder: Secondary | ICD-10-CM | POA: Diagnosis present

## 2016-04-21 DIAGNOSIS — F5104 Psychophysiologic insomnia: Secondary | ICD-10-CM | POA: Diagnosis present

## 2016-04-21 DIAGNOSIS — L89623 Pressure ulcer of left heel, stage 3: Secondary | ICD-10-CM | POA: Diagnosis not present

## 2016-04-21 DIAGNOSIS — R262 Difficulty in walking, not elsewhere classified: Secondary | ICD-10-CM | POA: Diagnosis not present

## 2016-04-21 DIAGNOSIS — E7800 Pure hypercholesterolemia, unspecified: Secondary | ICD-10-CM | POA: Diagnosis present

## 2016-04-21 DIAGNOSIS — R488 Other symbolic dysfunctions: Secondary | ICD-10-CM | POA: Diagnosis not present

## 2016-04-21 DIAGNOSIS — I69398 Other sequelae of cerebral infarction: Secondary | ICD-10-CM | POA: Diagnosis not present

## 2016-04-21 DIAGNOSIS — Z823 Family history of stroke: Secondary | ICD-10-CM | POA: Diagnosis not present

## 2016-04-21 DIAGNOSIS — R4781 Slurred speech: Secondary | ICD-10-CM | POA: Diagnosis present

## 2016-04-21 DIAGNOSIS — R531 Weakness: Secondary | ICD-10-CM | POA: Diagnosis not present

## 2016-04-21 DIAGNOSIS — R269 Unspecified abnormalities of gait and mobility: Secondary | ICD-10-CM | POA: Diagnosis not present

## 2016-04-21 DIAGNOSIS — I739 Peripheral vascular disease, unspecified: Secondary | ICD-10-CM | POA: Diagnosis present

## 2016-04-21 LAB — CBC
HEMATOCRIT: 33.3 % — AB (ref 39.0–52.0)
Hemoglobin: 10.8 g/dL — ABNORMAL LOW (ref 13.0–17.0)
MCH: 29.1 pg (ref 26.0–34.0)
MCHC: 32.4 g/dL (ref 30.0–36.0)
MCV: 89.8 fL (ref 78.0–100.0)
Platelets: 259 10*3/uL (ref 150–400)
RBC: 3.71 MIL/uL — ABNORMAL LOW (ref 4.22–5.81)
RDW: 14.5 % (ref 11.5–15.5)
WBC: 12.4 10*3/uL — AB (ref 4.0–10.5)

## 2016-04-21 LAB — COMPREHENSIVE METABOLIC PANEL
ALBUMIN: 3.3 g/dL — AB (ref 3.5–5.0)
ALT: 12 U/L — ABNORMAL LOW (ref 17–63)
AST: 30 U/L (ref 15–41)
Alkaline Phosphatase: 46 U/L (ref 38–126)
Anion gap: 10 (ref 5–15)
BUN: 11 mg/dL (ref 6–20)
CHLORIDE: 102 mmol/L (ref 101–111)
CO2: 29 mmol/L (ref 22–32)
Calcium: 9.2 mg/dL (ref 8.9–10.3)
Creatinine, Ser: 0.87 mg/dL (ref 0.61–1.24)
GFR calc Af Amer: >60 mL/min (ref >60)
GFR calc non Af Amer: >60 mL/min (ref >60)
GLUCOSE: 95 mg/dL (ref 65–99)
POTASSIUM: 3.2 mmol/L — AB (ref 3.5–5.1)
Sodium: 141 mmol/L (ref 135–145)
Total Bilirubin: 0.8 mg/dL (ref 0.3–1.2)
Total Protein: 7.1 g/dL (ref 6.5–8.1)

## 2016-04-21 NOTE — Consult Note (Signed)
Requesting Physician: Dr. Susie Cassette    Chief Complaint: Stroke  History obtained from:  Patient    HPI:                                                                                                                                         Calvin Diaz is an 76 y.o. 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 Patient was seen by Neurology who recommended continued xarelto on discharge. Chart reviewed. On follow up with Neurology on 03/30/16, patient was found to be no longer taking xarelto secondary to cost. Patient returns today with complaints of worsened slurred speech starting 7-8pm on 3/28.  MRI was obtained and did show small focus of acute infarct within the right superolateral parietal lobe at the margins of the prior chronic right MCA infarct.   Patient is a poor historian secondary to his underlying dementia.  Date last known well: Date: 04/19/2016 Time last known well: Time: 19:00 tPA Given: No: recent stroke              Past Medical History:  Diagnosis Date  . Dementia   . High cholesterol   . Hypertension   . Myocardial infarction   . Stroke [facility])     Past Surgical History:  Procedure Laterality Date  . CORONARY ANGIOPLASTY      Family History  Problem Relation Age of Onset  . Arrhythmia Mother     Had pacemaker  . Stroke Mother   . Stroke Maternal Aunt   . Heart attack Father    Social History:  reports that he has quit smoking. His smoking use included Cigarettes. He quit smokeless tobacco use about 8 months ago. He reports that he does not drink alcohol or use drugs.  Allergies: No Known Allergies  Medications:                                                                                                                           Prior  to Admission:  Prescriptions Prior to Admission  Medication Sig Dispense Refill Last Dose  . amLODipine (NORVASC) 5 MG tablet Take 1 tablet (5 mg total) by mouth daily.  90 tablet 3 04/20/2016 at Unknown time  . citalopram (CELEXA) 20 MG tablet Take 1 tablet (20 mg total) by mouth daily. 30 tablet 0 04/20/2016 at Unknown time  . clopidogrel (PLAVIX) 75 MG tablet Take 1 tablet (75 mg total) by mouth daily. 30 tablet 11 04/20/2016 at 0700  . colchicine 0.6 MG tablet Take 1 tablet (0.6 mg total) by mouth daily. 30 tablet 0 04/20/2016 at Unknown time  . divalproex (DEPAKOTE SPRINKLES) 125 MG capsule Take 4 capsules (500 mg total) by mouth 2 (two) times daily. 240 capsule 11 04/20/2016 at Unknown time  . memantine (NAMENDA) 5 MG tablet Take 2 tablets (10 mg total) by mouth daily. 90 tablet 3 04/20/2016 at Unknown time  . Multiple Vitamin (MULTIVITAMIN WITH MINERALS) TABS tablet Take 1 tablet by mouth daily.   04/20/2016 at Unknown time  . pravastatin (PRAVACHOL) 80 MG tablet Take 1 tablet (80 mg total) by mouth at bedtime. 90 tablet 3 04/19/2016 at Unknown time  . terazosin (HYTRIN) 1 MG capsule Take 1 capsule (1 mg total) by mouth at bedtime. 30 capsule 5 04/19/2016 at Unknown time   Scheduled: . amLODipine  5 mg Oral Daily  . citalopram  20 mg Oral Daily  . clopidogrel  75 mg Oral Daily  . colchicine  0.6 mg Oral Daily  . divalproex  500 mg Oral BID  . memantine  10 mg Oral Daily  . pravastatin  80 mg Oral QHS  . rivaroxaban  20 mg Oral Q supper  . sodium chloride flush  3 mL Intravenous Q12H  . terazosin  1 mg Oral QHS    ROS:                                                                                                                                       History obtained from the patient  General ROS: negative for - chills, fatigue, fever, night sweats, weight gain or weight loss Psychological ROS: negative for - behavioral disorder, hallucinations, memory difficulties, mood swings or suicidal  ideation Ophthalmic ROS: negative for - blurry vision, double vision, eye pain or loss of vision ENT ROS: negative for - epistaxis, nasal discharge, oral lesions, sore throat, tinnitus or vertigo Allergy and Immunology ROS: negative for - hives or itchy/watery eyes Hematological and Lymphatic ROS: negative for - bleeding problems, bruising or swollen lymph nodes Endocrine ROS: negative for - galactorrhea, hair pattern changes, polydipsia/polyuria or temperature intolerance Respiratory ROS: negative for - cough, hemoptysis, shortness of breath or wheezing Cardiovascular ROS: negative for - chest pain, dyspnea on exertion, edema or irregular heartbeat Gastrointestinal ROS: negative for - abdominal pain, diarrhea, hematemesis, nausea/vomiting or stool incontinence Genito-Urinary ROS: negative for - dysuria, hematuria, incontinence or urinary frequency/urgency Musculoskeletal ROS: negative for - joint swelling or muscular weakness Neurological ROS: as noted in HPI Dermatological ROS: negative for rash and skin lesion changes  Neurologic Examination:  Blood pressure 117/61, pulse 80, temperature 98.7 F (37.1 C), temperature source Oral, resp. rate 18, height 5\' 11"  (1.803 m), weight 62 kg (136 lb 11.2 oz), SpO2 98 %.  HEENT-  Normocephalic, no lesions, without obvious abnormality.  Normal external eye and conjunctiva.  Normal TM's bilaterally.  Normal auditory canals and external ears. Normal external nose, mucus membranes and septum.  Normal pharynx. Cardiovascular- S1, S2 normal, pulses palpable throughout   Lungs- chest clear, no wheezing, rales, normal symmetric air entry, Heart exam - S1, S2 normal, no murmur, no gallop, rate regular Abdomen- normal findings: bowel sounds normal Extremities- no edema Lymph-no adenopathy palpable Musculoskeletal-no joint tenderness, deformity or  swelling Skin-warm and dry, no hyperpigmentation, vitiligo, or suspicious lesions  Neurological Examination Mental Status: Alert, oriented to hospital but not month nor year uses one word answers when asked questions.   Speech slightly dysarthric without evidence of aphasia.  Able to follow simple commands without difficulty. Cranial Nerves: II: Agent has difficult time following commands with visual exam as he continues to look from side to side however I do believe that he has decreased visual field on the left  III,IV, VI: ptosis not present, extra-ocular motions intact bilaterally--however he does have a hard time fully getting his eyes all the way to the left gaze at times, pupils equal, round, reactive to light and accommodation V,VII: smile symmetric, facial light touch sensation normal bilaterally VIII: hearing normal bilaterally IX,X: uvula rises symmetrically XI: bilateral shoulder shrug XII: midline tongue extension Motor: Right : Upper extremity   5/5    Left:     Upper extremity   held in contraction at 45 and painful to move  Lower extremity   5/5     Lower extremity   2/5 proximally and distally with increased tone Tone and bulk:normal tone throughout; no atrophy noted Sensory: Decreased on the left Deep Tendon Reflexes: 2+ right upper and right knee jerk. Left upper has increased tone thus I could not get a deep tendon reflex left knee jerk again was decreased secondary to increased tone Plantars: Right: downgoing   Left: Upgoing Cerebellar: normal finger-to-nose on the right Gait: Not tested       Lab Results: Basic Metabolic Panel:  Recent Labs Lab 04/20/16 1436 04/21/16 0318  NA 142 141  K 3.7 3.2*  CL 105 102  CO2 27 29  GLUCOSE 124* 95  BUN 14 11  CREATININE 1.04 0.87  CALCIUM 9.1 9.2    Liver Function Tests:  Recent Labs Lab 04/20/16 1436 04/21/16 0318  AST 28 30  ALT 11* 12*  ALKPHOS 50 46  BILITOT 0.5 0.8  PROT 7.5 7.1  ALBUMIN 3.6  3.3*   No results for input(s): LIPASE, AMYLASE in the last 168 hours. No results for input(s): AMMONIA in the last 168 hours.  CBC:  Recent Labs Lab 04/20/16 1436 04/21/16 0318  WBC 10.7* 12.4*  HGB 11.9* 10.8*  HCT 36.8* 33.3*  MCV 90.4 89.8  PLT 233 259    Cardiac Enzymes: No results for input(s): CKTOTAL, CKMB, CKMBINDEX, TROPONINI in the last 168 hours.  Lipid Panel: No results for input(s): CHOL, TRIG, HDL, CHOLHDL, VLDL, LDLCALC in the last 168 hours.  CBG: No results for input(s): GLUCAP in the last 168 hours.  Microbiology: Results for orders placed or performed during the hospital encounter of 12/13/15  Culture, blood (Routine X 2) w Reflex to ID Panel     Status: None   Collection Time: 12/13/15  5:07 PM  Result Value Ref Range Status   Specimen Description BLOOD LEFT FOREARM  Final   Special Requests BOTTLES DRAWN AEROBIC AND ANAEROBIC 5CC  Final   Culture NO GROWTH 5 DAYS  Final   Report Status 12/18/2015 FINAL  Final  Culture, blood (Routine X 2) w Reflex to ID Panel     Status: None   Collection Time: 12/13/15  5:07 PM  Result Value Ref Range Status   Specimen Description BLOOD RIGHT FOREARM  Final   Special Requests BOTTLES DRAWN AEROBIC AND ANAEROBIC 5CC  Final   Culture NO GROWTH 5 DAYS  Final   Report Status 12/18/2015 FINAL  Final  Anaerobic culture     Status: None   Collection Time: 12/20/15 12:35 PM  Result Value Ref Range Status   Specimen Description SYNOVIAL LEFT ARM  Final   Special Requests ELBOW  Final   Culture NO ANAEROBES ISOLATED  Final   Report Status 12/25/2015 FINAL  Final  Body fluid culture     Status: None   Collection Time: 12/20/15 12:35 PM  Result Value Ref Range Status   Specimen Description SYNOVIAL LEFT ARM  Final   Special Requests ELBOW  Final   Gram Stain   Final    FEW WBC PRESENT,BOTH PMN AND MONONUCLEAR NO ORGANISMS SEEN    Culture NO GROWTH 3 DAYS  Final   Report Status 12/23/2015 FINAL  Final     Coagulation Studies: No results for input(s): LABPROT, INR in the last 72 hours.  Imaging: Ct Head Wo Contrast  Result Date: 04/20/2016 CLINICAL DATA:  Slurred speech. EXAM: CT HEAD WITHOUT CONTRAST TECHNIQUE: Contiguous axial images were obtained from the base of the skull through the vertex without intravenous contrast. COMPARISON:  CT scan of February 06, 2016. FINDINGS: Brain: Stable encephalomalacia is seen involving the right MCA territory consistent with old infarction. Stable left frontal encephalomalacia is noted consistent with old infarction. No mass effect or midline shift is noted. Ventricular size is within normal limits. There is no evidence of mass lesion, hemorrhage or acute infarction. Vascular: No hyperdense vessel or unexpected calcification. Skull: Normal. Negative for fracture or focal lesion. Sinuses/Orbits: No acute finding. Other: None. IMPRESSION: Old bilateral cerebral infarctions. No acute intracranial abnormality seen. Electronically Signed   By: Lupita Raider, M.D.   On: 04/20/2016 15:40   Mr Brain Wo Contrast  Result Date: 04/20/2016 CLINICAL DATA:  History of stroke with worsening slurred speech and generalized weakness. EXAM: MRI HEAD WITH AND WITHOUT CONTRAST TECHNIQUE: Multiplanar, multiecho pulse sequences of the brain and surrounding structures were obtained with and without intravenous contrast. CONTRAST:  14mL MULTIHANCE GADOBENATE DIMEGLUMINE 529 MG/ML IV SOLN COMPARISON:  12/14/2015 MRI of the head. 04/20/2016 and CT of the head. FINDINGS: Brain: Small focus of diffusion restriction within the right superolateral parietal lobe at the margins of an area of encephalomalacia from prior infarction compatible with interval acute infarct. There are multiple additional areas of encephalomalacia within the right occipital lobe, right posterior temporal lobe, bilateral frontal lobes, and right insula compatible with chronic infarction. The infarctions in the right  posterior temporal, right parietal, and bilateral superolateral frontal lobes demonstrate susceptibility hypointensity compatible with hemosiderin deposition from prior hemorrhage. This is most pronounced in the right superolateral frontal lobe (series 9, image 74) where the prominent susceptibility artifact gives the appearance of mass effect. The signal pattern is unchanged when compared with the prior MRI of the brain. There is no evidence of extra-axial  collection, hydrocephalus, or effacement of basilar cisterns. After administration of intravenous contrast there is no abnormal enhancement of the brain. There is slight smooth pachymeningeal thickening which is likely the result of volume loss in the brain and altered flow dynamics post multiple infarctions. Vascular: Normal flow voids. Skull and upper cervical spine: Normal marrow signal. Sinuses/Orbits: Mild maxillary sinus mucosal thickening. Otherwise negative. Other: None. IMPRESSION: 1. Small focus of acute infarction within the right superolateral parietal lobe at the margins of a prior chronic right MCA infarction. 2. Multiple stable chronic infarcts of the supratentorial brain bilaterally. Electronically Signed   By: Mitzi Hansen M.D.   On: 04/20/2016 22:41   Mr Brain W Contrast  Result Date: 04/20/2016 CLINICAL DATA:  History of stroke with worsening slurred speech and generalized weakness. EXAM: MRI HEAD WITH AND WITHOUT CONTRAST TECHNIQUE: Multiplanar, multiecho pulse sequences of the brain and surrounding structures were obtained with and without intravenous contrast. CONTRAST:  14mL MULTIHANCE GADOBENATE DIMEGLUMINE 529 MG/ML IV SOLN COMPARISON:  12/14/2015 MRI of the head. 04/20/2016 and CT of the head. FINDINGS: Brain: Small focus of diffusion restriction within the right superolateral parietal lobe at the margins of an area of encephalomalacia from prior infarction compatible with interval acute infarct. There are multiple  additional areas of encephalomalacia within the right occipital lobe, right posterior temporal lobe, bilateral frontal lobes, and right insula compatible with chronic infarction. The infarctions in the right posterior temporal, right parietal, and bilateral superolateral frontal lobes demonstrate susceptibility hypointensity compatible with hemosiderin deposition from prior hemorrhage. This is most pronounced in the right superolateral frontal lobe (series 9, image 74) where the prominent susceptibility artifact gives the appearance of mass effect. The signal pattern is unchanged when compared with the prior MRI of the brain. There is no evidence of extra-axial collection, hydrocephalus, or effacement of basilar cisterns. After administration of intravenous contrast there is no abnormal enhancement of the brain. There is slight smooth pachymeningeal thickening which is likely the result of volume loss in the brain and altered flow dynamics post multiple infarctions. Vascular: Normal flow voids. Skull and upper cervical spine: Normal marrow signal. Sinuses/Orbits: Mild maxillary sinus mucosal thickening. Otherwise negative. Other: None. IMPRESSION: 1. Small focus of acute infarction within the right superolateral parietal lobe at the margins of a prior chronic right MCA infarction. 2. Multiple stable chronic infarcts of the supratentorial brain bilaterally. Electronically Signed   By: Mitzi Hansen M.D.   On: 04/20/2016 22:41   Dg Chest Port 1 View  Result Date: 04/20/2016 CLINICAL DATA:  Initial evaluation for increase generalized weakness, slurred speech. EXAM: PORTABLE CHEST 1 VIEW COMPARISON:  Prior radiograph from 02/06/2016. FINDINGS: Cardiac and mediastinal silhouettes are stable in size and contour, and remain within normal limits. Aortic atherosclerosis noted. Lungs normally inflated. No focal infiltrates. No pulmonary edema. No definite pleural effusion, although the costophrenic angles are  incompletely visualized. No pneumothorax. No acute osseous abnormality. Rounded opacity overlying the medial left hemidiaphragm suspected to reflect a a hernia. IMPRESSION: 1. No active cardiopulmonary disease. 2. Well-circumscribed rounded opacity overlying the medial left hemidiaphragm. Finding suspected to reflect a hernia. 3. Aortic atherosclerosis. Electronically Signed   By: Rise Mu M.D.   On: 04/20/2016 17:14       Assessment and plan discussed with with attending physician and they are in agreement.    Felicie Morn PA-C [facility] [phone]  04/21/2016, 12:22 PM   Assessment: 76 y.o. male with small focus of acute infarct within the right superior  lateral parietal lobe while being off Xarelto. He has restricted diffusion in the area of his previous infarct, and I wonder if this actually represents a hypertensive episode or some other insult causing mild extension as opposed to a new embolic infarct. In any case, he will need anticoagulation in the long-term.  Stroke Risk Factors - hyperlipidemia and hypertension  1) he will need anticoagulation in the long-term 2) lipid panel, A1c 3) PT, OT, ST   Ritta Slot, MD [facility] [phone]  If 7pm- 7am, please page neurology on call as listed in [facility].

## 2016-04-21 NOTE — Progress Notes (Signed)
Pt BLE heel noted to have bilateral pink foam dsg intact. Pt's wife concern for wound; states the dsg was removed in the ED and pt wound is now starting to heal and don't want anything to affect it. wound care consult order placed; pt and wife voices agreement and will closely monitor. Dionne Bucy RN

## 2016-04-21 NOTE — Evaluation (Signed)
Physical Therapy Evaluation Patient Details Name: Calvin Diaz MRN: [id number] DOB: 05/19/1940 Today's Date: 04/21/2016   History of Present Illness  Calvin Diaz is a 76 y.o. 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 Patient was seen by Neurology who recommended continued xarelto on discharge. Chart reviewed. On follow up with Neurology on 03/30/16, patient was found to be no longer taking xarelto secondary to cost. Patient returned to ED on 04/20/16 with complaints of worsened slurred speech starting 7-8pm on 3/28 with generalized weakness. MRI revealed small focus of acute infarction within the right superolateral parietal lobe at the margins of a prior chronic right MCA Infarction. Multiple stable chronic infarcts of the supratentorial brain bilaterally.  Clinical Impression  Pt admitted with above diagnosis. Pt currently with functional limitations due to the deficits listed below (see PT Problem List). At the time of PT eval pt was able to perform transfers with grossly +2 max assist. Pt required step-by-step cueing at times to complete tasks, and visual cues were helpful as well. Pt will benefit from skilled PT to increase their independence and safety with mobility to allow discharge to the venue listed below.       Follow Up Recommendations SNF;Supervision/Assistance - 24 hour    Equipment Recommendations  None recommended by PT    Recommendations for Other Services       Precautions / Restrictions Precautions Precautions: Fall Restrictions Weight Bearing Restrictions: No      Mobility  Bed Mobility Overal bed mobility: Needs Assistance Bed Mobility: Supine to Sit     Supine to sit: Max assist;+2 for physical assistance     General bed mobility  comments: Bed pad used for assist in transitioning to EOB. VC's for sequencing and assist initially required for sitting balance support.   Transfers Overall transfer level: Needs assistance Equipment used: 2 person hand held assist Transfers: Sit to/from [employer] Sit to Stand: Max assist;+2 physical assistance Stand pivot transfers: Max assist;+2 physical assistance       General transfer comment: Assist for all aspects of transfer to recliner chair. Pt able to hold to therapist's arm on the R for support, and HHA was utilized on weaker LUE. Pt was able to take a few steps around to the chair.   Ambulation/Gait             General Gait Details: Unable at this time.   Stairs            Wheelchair Mobility    Modified Rankin (Stroke Patients Only)       Balance Overall balance assessment: Needs assistance Sitting-balance support: Feet supported;No upper extremity supported Sitting balance-Leahy Scale: Poor Sitting balance - Comments: Poor initially progressing to fair after ~2 minutes sitting EOB.  Postural control: Posterior lean Standing balance support: Bilateral upper extremity supported Standing balance-Leahy Scale: Zero Standing balance comment: Max +2 required                             Pertinent Vitals/Pain Pain Assessment: Faces Faces Pain Scale: Hurts little more Pain Location: L heel, L UE with light touch at times Pain Descriptors / Indicators: Sore;Grimacing Pain Intervention(s): Monitored during session    Home Living Family/patient expects to be discharged to:: Unsure  Living Arrangements: Spouse/significant other               Additional Comments: pt was at home per chart    Prior Function Level of Independence:  (unable to obtain ias Pt is not reliable historian)         Comments: Unsure what PLOF is. Taken from a previous admission in November: Per wife, pt was independent with ADL and mobility up  until ~2 weeks ago. Since then, he has needed increased assist with ADL.     Hand Dominance   Dominant Hand: Right    Extremity/Trunk Assessment   Upper Extremity Assessment Upper Extremity Assessment: Defer to OT evaluation LUE Deficits / Details: Pt does not move arm on command. Pt with hand in full extension with no ability to close fingers. Pt unable to abduct, but OT could manually bring arm out to about 30 degrees with Pt resisting LUE Sensation:  (Pt claims sensation intact) LUE Coordination: decreased fine motor;decreased gross motor    Lower Extremity Assessment Lower Extremity Assessment: Generalized weakness    Cervical / Trunk Assessment Cervical / Trunk Assessment: Other exceptions Cervical / Trunk Exceptions: Noted prominent forward head/rounded shoulder posture  Communication   Communication: Expressive difficulties  Cognition Arousal/Alertness: Awake/alert Behavior During Therapy: WFL for tasks assessed/performed Overall Cognitive Status: No family/caregiver present to determine baseline cognitive functioning Area of Impairment: Following commands;Memory;Safety/judgement;Awareness;Attention;Orientation                 Orientation Level: Time;Place;Disoriented to Current Attention Level: Focused Memory: Decreased short-term memory Following Commands: Follows one step commands with increased time;Follows one step commands inconsistently Safety/Judgement: Decreased awareness of deficits;Decreased awareness of safety Awareness: Intellectual   General Comments: Pt thought it was April when we asked him what month it was, could not etablish history. Did not remember wife's name; but able to correct therapist when called the wrong name      General Comments      Exercises     Assessment/Plan    PT Assessment Patient needs continued PT services  PT Problem List Decreased strength;Decreased range of motion;Decreased activity tolerance;Decreased  balance;Decreased mobility;Decreased knowledge of use of DME;Decreased cognition;Decreased safety awareness;Decreased knowledge of precautions;Pain;Impaired tone       PT Treatment Interventions DME instruction;Gait training;Stair training;Functional mobility training;Therapeutic activities;Therapeutic exercise;Neuromuscular re-education;Patient/family education    PT Goals (Current goals can be found in the Care Plan section)  Acute Rehab PT Goals Patient Stated Goal: Pt did not state goals during session PT Goal Formulation: With patient Time For Goal Achievement: 04/28/16 Potential to Achieve Goals: Fair    Frequency Min 3X/week   Barriers to discharge   Unclear PLOF and home situation    Co-evaluation PT/OT/SLP Co-Evaluation/Treatment: Yes Reason for Co-Treatment: Necessary to address cognition/behavior during functional activity;To address functional/ADL transfers PT goals addressed during session: Mobility/safety with mobility;Balance;Proper use of DME OT goals addressed during session: ADL's and self-care       End of Session Equipment Utilized During Treatment: Gait belt Activity Tolerance: Patient tolerated treatment well Patient left: in chair;with call bell/phone within reach;with chair alarm set;Other (comment) (MD present in room) Nurse Communication: Mobility status PT Visit Diagnosis: Unsteadiness on feet (R26.81);Hemiplegia and hemiparesis Hemiplegia - Right/Left: Left Hemiplegia - dominant/non-dominant: Non-dominant Hemiplegia - caused by: Cerebral infarction    Time: 1101-1134 PT Time Calculation (min) (ACUTE ONLY): 33 min   Charges:   PT Evaluation $PT Eval Moderate Complexity: 1 Procedure     PT G Codes:  [name], PT, DPT Acute Rehabilitation Services Pager: [phone]   [name] 04/21/2016, 1:18 PM   [name], PT, DPT Acute Rehabilitation Services Pager: [phone]

## 2016-04-21 NOTE — Progress Notes (Signed)
[facility] PROGRESS NOTE  Calvin Diaz ZOX:[id number] DOB: 07/19/1940 DOA: 04/20/2016   PCP: [facility]     Assessment/Plan: Active Problems:   History of CVA with residual deficit   Hyperlipidemia   Essential hypertension   Atrial fibrillation (HCC)   Dementia   CVA (cerebral vascular accident) (HCC)   Leukocytosis   Polyarticular arthritis   Stroke [facility])   Acute CVA (cerebrovascular accident) (HCC)    76 y.o. male with medical history significant of CVA from 11/18, afib, HTN, gouty arthritis, dementia who was admitted 11/17 with slurred speech, found to have a small area of acute ischemia superimposed on the area of encephalomalacia associated with remote right MCA infarct.Patient was seen by Neurology who recommended continued xarelto on discharge. Chart reviewed. On follow up with Neurology on 03/30/16, patient was found to be no longer taking xarelto secondary to cost. Patient returns today with complaints of worsened slurred speech starting 7-8pm on 3/28 with generalized weakness.. Patient admitted for stroke workup  Assessment and plan Acute CVA Small focus of acute infarction within the right superolateral parietal lobe at the margins of a prior chronic right MCA infarction.2. Multiple stable chronic infarcts of the supratentorial brain bilaterally. Resumed Xarelto We will consult neurology Consult PT/OT/SLP Continue statin  Dyslipidemia: LDL 55-continue statin  Persistent Atrial fibrillation:Back in sinus rhythm-Not on any rate control medications, continue Xarelto.CHADSVASC score of 4. Was seen by cardiology as an outpatient on 11/15-planswere forr DCCV-after 4 weeks of uninterrupted Xarelto therapy-followed by Rush Landmark, MD, last seen 04/11/16  Hypertension:Hold amlodipine and continue Terazosin, to allow for permissive hypertension  Dementia with mild delirium:Continue Namenda.  Chronic insomnia:Resume  belsomra as outpatient  Patchy apical lung opacities:Seen on CT angiogram of the neck, no obvious infiltrates seen on chest x-ray. Radiology recommends to repeat CT in 6 months.   Peripheral vascular disease Doppler studies performed in our office 03/01/16 revealed a right ABI 0.95 and a left of 1.1 with three-vessel runoff bilaterally. He did have a moderate lesion in his distal right SFA. He is minimally ambulatory with the aid of a cane and assistance from his wife. He denies claudication. He does have a palpable pedal pulse on the right, on Plavix  DVT prophylaxsis   Code Status:  DNR     Family Communication: Discussed in detail with the patient, all imaging results, lab results explained to the patient   Disposition Plan:   Anticipate discharge in one to 2 days      Consultants:  Neurology  Procedures:  None  Antibiotics: Anti-infectives    None         HPI/Subjective: Unable to give any hx , very confused   Objective: Vitals:   04/21/16 0434 04/21/16 0632 04/21/16 0740 04/21/16 0922  BP: (!) 126/59 115/63 (!) 123/94 117/61  Pulse: 86 82 78 80  Resp: Temp: 99 F (37.2 C) 99.1 F (37.3 C) 98.6 F (37 C) 98.7 F (37.1 C)  TempSrc: Oral Oral Oral Oral  SpO2: 99% 97% 98% 98%  Weight:      Height:       No intake or output data in the 24 hours ending 04/21/16 1105  Exam:  Examination:  General exam: Appears calm and comfortable  Respiratory system: Clear to auscultation. Respiratory effort normal. Cardiovascular system: S1 & S2 heard, RRR. No JVD, murmurs, rubs, gallops or clicks. No pedal edema. Gastrointestinal system: Abdomen is nondistended,  soft and nontender. No organomegaly or masses felt. Normal bowel sounds heard. Central nervous system: Alert  , expressive aphasia  Extremities: Symmetric 5 x 5 power. Skin: No rashes, lesions or ulcers Psychiatry: Judgement imapired     Data Reviewed: I have personally reviewed following labs  and imaging studies  Micro Results No results found for this or any previous visit (from the past 240 hour(s)).  Radiology Reports Ct Head Wo Contrast  Result Date: 04/20/2016 CLINICAL DATA:  Slurred speech. EXAM: CT HEAD WITHOUT CONTRAST TECHNIQUE: Contiguous axial images were obtained from the base of the skull through the vertex without intravenous contrast. COMPARISON:  CT scan of February 06, 2016. FINDINGS: Brain: Stable encephalomalacia is seen involving the right MCA territory consistent with old infarction. Stable left frontal encephalomalacia is noted consistent with old infarction. No mass effect or midline shift is noted. Ventricular size is within normal limits. There is no evidence of mass lesion, hemorrhage or acute infarction. Vascular: No hyperdense vessel or unexpected calcification. Skull: Normal. Negative for fracture or focal lesion. Sinuses/Orbits: No acute finding. Other: None. IMPRESSION: Old bilateral cerebral infarctions. No acute intracranial abnormality seen. Electronically Signed   By: Lupita Raider, M.D.   On: 04/20/2016 15:40   Mr Brain Wo Contrast  Result Date: 04/20/2016 CLINICAL DATA:  History of stroke with worsening slurred speech and generalized weakness. EXAM: MRI HEAD WITH AND WITHOUT CONTRAST TECHNIQUE: Multiplanar, multiecho pulse sequences of the brain and surrounding structures were obtained with and without intravenous contrast. CONTRAST:  14mL MULTIHANCE GADOBENATE DIMEGLUMINE 529 MG/ML IV SOLN COMPARISON:  12/14/2015 MRI of the head. 04/20/2016 and CT of the head. FINDINGS: Brain: Small focus of diffusion restriction within the right superolateral parietal lobe at the margins of an area of encephalomalacia from prior infarction compatible with interval acute infarct. There are multiple additional areas of encephalomalacia within the right occipital lobe, right posterior temporal lobe, bilateral frontal lobes, and right insula compatible with chronic  infarction. The infarctions in the right posterior temporal, right parietal, and bilateral superolateral frontal lobes demonstrate susceptibility hypointensity compatible with hemosiderin deposition from prior hemorrhage. This is most pronounced in the right superolateral frontal lobe (series 9, image 74) where the prominent susceptibility artifact gives the appearance of mass effect. The signal pattern is unchanged when compared with the prior MRI of the brain. There is no evidence of extra-axial collection, hydrocephalus, or effacement of basilar cisterns. After administration of intravenous contrast there is no abnormal enhancement of the brain. There is slight smooth pachymeningeal thickening which is likely the result of volume loss in the brain and altered flow dynamics post multiple infarctions. Vascular: Normal flow voids. Skull and upper cervical spine: Normal marrow signal. Sinuses/Orbits: Mild maxillary sinus mucosal thickening. Otherwise negative. Other: None. IMPRESSION: 1. Small focus of acute infarction within the right superolateral parietal lobe at the margins of a prior chronic right MCA infarction. 2. Multiple stable chronic infarcts of the supratentorial brain bilaterally. Electronically Signed   By: Mitzi Hansen M.D.   On: 04/20/2016 22:41   Mr Brain W Contrast  Result Date: 04/20/2016 CLINICAL DATA:  History of stroke with worsening slurred speech and generalized weakness. EXAM: MRI HEAD WITH AND WITHOUT CONTRAST TECHNIQUE: Multiplanar, multiecho pulse sequences of the brain and surrounding structures were obtained with and without intravenous contrast. CONTRAST:  14mL MULTIHANCE GADOBENATE DIMEGLUMINE 529 MG/ML IV SOLN COMPARISON:  12/14/2015 MRI of the head. 04/20/2016 and CT of the head. FINDINGS: Brain: Small focus of diffusion restriction within  the right superolateral parietal lobe at the margins of an area of encephalomalacia from prior infarction compatible with interval  acute infarct. There are multiple additional areas of encephalomalacia within the right occipital lobe, right posterior temporal lobe, bilateral frontal lobes, and right insula compatible with chronic infarction. The infarctions in the right posterior temporal, right parietal, and bilateral superolateral frontal lobes demonstrate susceptibility hypointensity compatible with hemosiderin deposition from prior hemorrhage. This is most pronounced in the right superolateral frontal lobe (series 9, image 74) where the prominent susceptibility artifact gives the appearance of mass effect. The signal pattern is unchanged when compared with the prior MRI of the brain. There is no evidence of extra-axial collection, hydrocephalus, or effacement of basilar cisterns. After administration of intravenous contrast there is no abnormal enhancement of the brain. There is slight smooth pachymeningeal thickening which is likely the result of volume loss in the brain and altered flow dynamics post multiple infarctions. Vascular: Normal flow voids. Skull and upper cervical spine: Normal marrow signal. Sinuses/Orbits: Mild maxillary sinus mucosal thickening. Otherwise negative. Other: None. IMPRESSION: 1. Small focus of acute infarction within the right superolateral parietal lobe at the margins of a prior chronic right MCA infarction. 2. Multiple stable chronic infarcts of the supratentorial brain bilaterally. Electronically Signed   By: Mitzi Hansen M.D.   On: 04/20/2016 22:41   Dg Chest Port 1 View  Result Date: 04/20/2016 CLINICAL DATA:  Initial evaluation for increase generalized weakness, slurred speech. EXAM: PORTABLE CHEST 1 VIEW COMPARISON:  Prior radiograph from 02/06/2016. FINDINGS: Cardiac and mediastinal silhouettes are stable in size and contour, and remain within normal limits. Aortic atherosclerosis noted. Lungs normally inflated. No focal infiltrates. No pulmonary edema. No definite pleural effusion,  although the costophrenic angles are incompletely visualized. No pneumothorax. No acute osseous abnormality. Rounded opacity overlying the medial left hemidiaphragm suspected to reflect a a hernia. IMPRESSION: 1. No active cardiopulmonary disease. 2. Well-circumscribed rounded opacity overlying the medial left hemidiaphragm. Finding suspected to reflect a hernia. 3. Aortic atherosclerosis. Electronically Signed   By: Rise Mu M.D.   On: 04/20/2016 17:14     CBC  Recent Labs Lab 04/20/16 1436 04/21/16 0318  WBC 10.7* 12.4*  HGB 11.9* 10.8*  HCT 36.8* 33.3*  PLT 233 259  MCV 90.4 89.8  MCH 29.2 29.1  MCHC 32.3 32.4  RDW 14.6 14.5    Chemistries   Recent Labs Lab 04/20/16 1436 04/21/16 0318  NA 142 141  K 3.7 3.2*  CL 105 102  CO2 27 29  GLUCOSE 124* 95  BUN 14 11  CREATININE 1.04 0.87  CALCIUM 9.1 9.2  AST 28 30  ALT 11* 12*  ALKPHOS 50 46  BILITOT 0.5 0.8   ------------------------------------------------------------------------------------------------------------------ estimated creatinine clearance is 64.3 mL/min (by C-G formula based on SCr of 0.87 mg/dL). ------------------------------------------------------------------------------------------------------------------ No results for input(s): HGBA1C in the last 72 hours. ------------------------------------------------------------------------------------------------------------------ No results for input(s): CHOL, HDL, LDLCALC, TRIG, CHOLHDL, LDLDIRECT in the last 72 hours. ------------------------------------------------------------------------------------------------------------------ No results for input(s): TSH, T4TOTAL, T3FREE, THYROIDAB in the last 72 hours.  Invalid input(s): FREET3 ------------------------------------------------------------------------------------------------------------------ No results for input(s): VITAMINB12, FOLATE, FERRITIN, TIBC, IRON, RETICCTPCT in the last 72  hours.  Coagulation profile No results for input(s): INR, PROTIME in the last 168 hours.  No results for input(s): DDIMER in the last 72 hours.  Cardiac Enzymes No results for input(s): CKMB, TROPONINI, MYOGLOBIN in the last 168 hours.  Invalid input(s): CK ------------------------------------------------------------------------------------------------------------------ Invalid input(s): POCBNP   CBG: No results for  input(s): GLUCAP in the last 168 hours.     Studies: Ct Head Wo Contrast  Result Date: 04/20/2016 CLINICAL DATA:  Slurred speech. EXAM: CT HEAD WITHOUT CONTRAST TECHNIQUE: Contiguous axial images were obtained from the base of the skull through the vertex without intravenous contrast. COMPARISON:  CT scan of February 06, 2016. FINDINGS: Brain: Stable encephalomalacia is seen involving the right MCA territory consistent with old infarction. Stable left frontal encephalomalacia is noted consistent with old infarction. No mass effect or midline shift is noted. Ventricular size is within normal limits. There is no evidence of mass lesion, hemorrhage or acute infarction. Vascular: No hyperdense vessel or unexpected calcification. Skull: Normal. Negative for fracture or focal lesion. Sinuses/Orbits: No acute finding. Other: None. IMPRESSION: Old bilateral cerebral infarctions. No acute intracranial abnormality seen. Electronically Signed   By: Lupita Raider, M.D.   On: 04/20/2016 15:40   Mr Brain Wo Contrast  Result Date: 04/20/2016 CLINICAL DATA:  History of stroke with worsening slurred speech and generalized weakness. EXAM: MRI HEAD WITH AND WITHOUT CONTRAST TECHNIQUE: Multiplanar, multiecho pulse sequences of the brain and surrounding structures were obtained with and without intravenous contrast. CONTRAST:  14mL MULTIHANCE GADOBENATE DIMEGLUMINE 529 MG/ML IV SOLN COMPARISON:  12/14/2015 MRI of the head. 04/20/2016 and CT of the head. FINDINGS: Brain: Small focus of diffusion  restriction within the right superolateral parietal lobe at the margins of an area of encephalomalacia from prior infarction compatible with interval acute infarct. There are multiple additional areas of encephalomalacia within the right occipital lobe, right posterior temporal lobe, bilateral frontal lobes, and right insula compatible with chronic infarction. The infarctions in the right posterior temporal, right parietal, and bilateral superolateral frontal lobes demonstrate susceptibility hypointensity compatible with hemosiderin deposition from prior hemorrhage. This is most pronounced in the right superolateral frontal lobe (series 9, image 74) where the prominent susceptibility artifact gives the appearance of mass effect. The signal pattern is unchanged when compared with the prior MRI of the brain. There is no evidence of extra-axial collection, hydrocephalus, or effacement of basilar cisterns. After administration of intravenous contrast there is no abnormal enhancement of the brain. There is slight smooth pachymeningeal thickening which is likely the result of volume loss in the brain and altered flow dynamics post multiple infarctions. Vascular: Normal flow voids. Skull and upper cervical spine: Normal marrow signal. Sinuses/Orbits: Mild maxillary sinus mucosal thickening. Otherwise negative. Other: None. IMPRESSION: 1. Small focus of acute infarction within the right superolateral parietal lobe at the margins of a prior chronic right MCA infarction. 2. Multiple stable chronic infarcts of the supratentorial brain bilaterally. Electronically Signed   By: Mitzi Hansen M.D.   On: 04/20/2016 22:41   Mr Brain W Contrast  Result Date: 04/20/2016 CLINICAL DATA:  History of stroke with worsening slurred speech and generalized weakness. EXAM: MRI HEAD WITH AND WITHOUT CONTRAST TECHNIQUE: Multiplanar, multiecho pulse sequences of the brain and surrounding structures were obtained with and without  intravenous contrast. CONTRAST:  14mL MULTIHANCE GADOBENATE DIMEGLUMINE 529 MG/ML IV SOLN COMPARISON:  12/14/2015 MRI of the head. 04/20/2016 and CT of the head. FINDINGS: Brain: Small focus of diffusion restriction within the right superolateral parietal lobe at the margins of an area of encephalomalacia from prior infarction compatible with interval acute infarct. There are multiple additional areas of encephalomalacia within the right occipital lobe, right posterior temporal lobe, bilateral frontal lobes, and right insula compatible with chronic infarction. The infarctions in the right posterior temporal, right parietal, and bilateral superolateral  frontal lobes demonstrate susceptibility hypointensity compatible with hemosiderin deposition from prior hemorrhage. This is most pronounced in the right superolateral frontal lobe (series 9, image 74) where the prominent susceptibility artifact gives the appearance of mass effect. The signal pattern is unchanged when compared with the prior MRI of the brain. There is no evidence of extra-axial collection, hydrocephalus, or effacement of basilar cisterns. After administration of intravenous contrast there is no abnormal enhancement of the brain. There is slight smooth pachymeningeal thickening which is likely the result of volume loss in the brain and altered flow dynamics post multiple infarctions. Vascular: Normal flow voids. Skull and upper cervical spine: Normal marrow signal. Sinuses/Orbits: Mild maxillary sinus mucosal thickening. Otherwise negative. Other: None. IMPRESSION: 1. Small focus of acute infarction within the right superolateral parietal lobe at the margins of a prior chronic right MCA infarction. 2. Multiple stable chronic infarcts of the supratentorial brain bilaterally. Electronically Signed   By: Mitzi Hansen M.D.   On: 04/20/2016 22:41   Dg Chest Port 1 View  Result Date: 04/20/2016 CLINICAL DATA:  Initial evaluation for increase  generalized weakness, slurred speech. EXAM: PORTABLE CHEST 1 VIEW COMPARISON:  Prior radiograph from 02/06/2016. FINDINGS: Cardiac and mediastinal silhouettes are stable in size and contour, and remain within normal limits. Aortic atherosclerosis noted. Lungs normally inflated. No focal infiltrates. No pulmonary edema. No definite pleural effusion, although the costophrenic angles are incompletely visualized. No pneumothorax. No acute osseous abnormality. Rounded opacity overlying the medial left hemidiaphragm suspected to reflect a a hernia. IMPRESSION: 1. No active cardiopulmonary disease. 2. Well-circumscribed rounded opacity overlying the medial left hemidiaphragm. Finding suspected to reflect a hernia. 3. Aortic atherosclerosis. Electronically Signed   By: Rise Mu M.D.   On: 04/20/2016 17:14      Lab Results  Component Value Date   HGBA1C 6.2 (H) 12/16/2015   Lab Results  Component Value Date   LDLCALC 55 12/16/2015   CREATININE 0.87 04/21/2016       Scheduled Meds: . amLODipine  5 mg Oral Daily  . citalopram  20 mg Oral Daily  . clopidogrel  75 mg Oral Daily  . colchicine  0.6 mg Oral Daily  . divalproex  500 mg Oral BID  . memantine  10 mg Oral Daily  . pravastatin  80 mg Oral QHS  . rivaroxaban  20 mg Oral Q supper  . sodium chloride flush  3 mL Intravenous Q12H  . terazosin  1 mg Oral QHS   Continuous Infusions: . sodium chloride    . dextrose 5 % and 0.9% NaCl       LOS: 0 days    Time spent: >30 MINS    [facility]  [facility] Pager [phone]. If 7PM-7AM, please contact night-coverage at [email or web address], password [facility] 04/21/2016, 11:05 AM  LOS: 0 days

## 2016-04-21 NOTE — Care Management Note (Signed)
Case Management Note  Patient Details  Name: Calvin Diaz MRN: [id number] Date of Birth: 1940-10-28  Subjective/Objective:                  From home with spouse. /76 y.o. 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  Action/Plan: Admit status OBSERVATION (History of CVA with residual deficit); anticipate discharge HOME WITH HOME HEALTH VS SNF.  Expected Discharge Date:   (unsure)               Expected Discharge Plan:  Home w Home Health Services  In-House Referral:  NA  Discharge planning Services  CM Consult  Post Acute Care Choice:  Resumption of Svcs/PTA Provider Choice offered to:     DME Arranged:    DME Agency:     HH Arranged:  RN, NA HH Agency:  [facility]  Status of Service:  In process, will continue to follow  If discussed at Long Length of Stay Meetings, dates discussed:    Additional Comments: Pt currently active with [facility] for RN/NA services.  Resumption of care requested. TIM HENDERSON, RN of [facility]  aware of admission.  No DME needs identified at this time.  Oletta Cohn, RN 04/21/2016, 10:39 AM

## 2016-04-21 NOTE — Care Management Note (Signed)
Case Management Note  Patient Details  Name: Calvin Diaz MRN: [id number] Date of Birth: [birth year]  Subjective/Objective:   Pt admitted with CVA. He is from home with his wife.                  Action/Plan: CM consulted for cost of Xarelto. Pt was on Xarelto at home but per wife cost was too much. CM spoke to representative at [facility] his medication coverage and they stated Xarelto is $47/ month. CM inquired about cost of Eliquis also and the price was the same. CM spoke to Mrs Swingle and she feels this cost is too much. CM informed Dr Susie Cassette.   Expected Discharge Date:   (unsure)               Expected Discharge Plan:  Home w Home Health Services  In-House Referral:  NA  Discharge planning Services  CM Consult  Post Acute Care Choice:  Resumption of Svcs/PTA Provider Choice offered to:     DME Arranged:    DME Agency:     HH Arranged:  RN, Nurse's Aide HH Agency:  [facility]  Status of Service:  In process, will continue to follow  If discussed at Long Length of Stay Meetings, dates discussed:    Additional Comments:  Kermit Balo, RN 04/21/2016, 12:19 PM

## 2016-04-21 NOTE — Evaluation (Signed)
Clinical/Bedside Swallow Evaluation Patient Details  Name: Calvin Diaz MRN: [id number] Date of Birth: 07-07-1940  Today's Date: 04/21/2016 Time: SLP Start Time (ACUTE ONLY): 1308 SLP Stop Time (ACUTE ONLY): 1324 SLP Time Calculation (min) (ACUTE ONLY): 16 min  Past Medical History:  Past Medical History:  Diagnosis Date  . Dementia   . High cholesterol   . Hypertension   . Myocardial infarction   . Stroke [facility])    Past Surgical History:  Past Surgical History:  Procedure Laterality Date  . CORONARY ANGIOPLASTY     HPI:  76 y.o.malewith medical history significant for CVA 12/11/15, afib, HTN, gouty arthritis, dementia who was admitted 04/20/16 with slurred speech, found to have a small area of acute ischemia superimposed on the area of encephalomalacia associated with remote right MCA infarct. Hx of dysphagia - primarily related to cognition - during November admission for CVA.  Assessment / Plan / Recommendation Clinical Impression  Pt presents with mild oral dysphagia with prolonged mastication, but likely adequate airway protection, consistent with findings from last MBS in Nov 2017.  Needs set-up and assist with meals due to poor initiation and neglect of left side.  Pt needs cues to persist with eating; otherwise will sit with tray in front of him untouched.   SLP Visit Diagnosis: Dysphagia, unspecified (R13.10)    Aspiration Risk  No limitations    Diet Recommendation   regular, thin liquids  Medication Administration: Whole meds with puree    Other  Recommendations Oral Care Recommendations: Oral care BID   Follow up Recommendations None      Frequency and Duration            Prognosis        Swallow Study   General Date of Onset: 04/20/16 HPI: 76 y.o.malewith medical history significant for CVA 12/11/15, afib, HTN, gouty arthritis, dementia who was admitted 04/20/16 with slurred speech, found to have a small area of acute ischemia superimposed on the  area of encephalomalacia associated with remote right MCA infarct. Type of Study: Bedside Swallow Evaluation Previous Swallow Assessment: 12/14/15 MBS no pen/aspiration but oral holding/delays due to cognitive deficits Diet Prior to this Study: Regular;Thin liquids Temperature Spikes Noted: No Respiratory Status: Room air History of Recent Intubation: No Behavior/Cognition: Alert;Confused Oral Cavity Assessment: Within Functional Limits Oral Care Completed by SLP: No Oral Cavity - Dentition: Missing dentition Vision: Functional for self-feeding Self-Feeding Abilities: Able to feed self;Needs set up Patient Positioning: Upright in chair Baseline Vocal Quality: Normal Volitional Cough: Strong Volitional Swallow: Able to elicit    Oral/Motor/Sensory Function Overall Oral Motor/Sensory Function: Mild impairment (sensorimotor on left)   Ice Chips Ice chips: Within functional limits   Thin Liquid Thin Liquid: Within functional limits Presentation: Cup;Straw    Nectar Thick Nectar Thick Liquid: Not tested   Honey Thick Honey Thick Liquid: Not tested   Puree Puree: Within functional limits Presentation: Self Fed;Spoon   Solid   GO   Solid: Within functional limits Presentation: Self Fed (prolonged oral preparation)        Ronzell Laban, Marchelle Folks Laurice 04/21/2016,1:28 PM

## 2016-04-21 NOTE — Evaluation (Signed)
Occupational Therapy Evaluation Patient Details Name: Calvin Diaz MRN: [id number] DOB: [birth year] Today's Date: 04/21/2016    History of Present Illness Calvin Diaz is a 76 y.o. 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 Patient was seen by Neurology who recommended continued xarelto on discharge. Chart reviewed. On follow up with Neurology on 03/30/16, patient was found to be no longer taking xarelto secondary to cost. Patient returned to ED on 04/20/16 with complaints of worsened slurred speech starting 7-8pm on 3/28 with generalized weakness. MRI revealed small focus of acute infarction within the right superolateral parietal lobe at the margins of a prior chronic right MCA Infarction. Multiple stable chronic infarcts of the supratentorial brain bilaterally.   Clinical Impression   Pt unreliable historian and unable to express independence PTA. Pt currently max A for ADL and max +2 for stand pivot transfer. Pt very cooperative and pleasant. LUE demonstrating changes and new deficits (see comments below). Pt will benefit from skilled OT in the acute setting prior to dc to venue below to maximize safety and independence in ADL. Next session to work on ROM of LUE and developing HEP.     Follow Up Recommendations  SNF;Supervision/Assistance - 24 hour    Equipment Recommendations  Other (comment) (defer to next venue)    Recommendations for Other Services       Precautions / Restrictions Precautions Precautions: Fall Restrictions Weight Bearing Restrictions: No      Mobility Bed Mobility Overal bed mobility: Needs Assistance Bed Mobility: Supine to Sit     Supine to sit: Max assist;+2 for physical assistance     General bed mobility comments: Bed pad used  for assist in transitioning to EOB. VC's for sequencing and assist initially required for sitting balance support.   Transfers Overall transfer level: Needs assistance Equipment used: 2 person hand held assist Transfers: Sit to/from [employer] Sit to Stand: Max assist;+2 physical assistance Stand pivot transfers: Max assist;+2 physical assistance       General transfer comment: Assist for all aspects of transfer to recliner chair. Pt able to hold to therapist's arm on the R for support, and HHA was utilized on weaker LUE. Pt was able to take a few steps around to the chair.     Balance Overall balance assessment: Needs assistance Sitting-balance support: Feet supported;No upper extremity supported Sitting balance-Leahy Scale: Poor Sitting balance - Comments: Poor initially progressing to fair after ~2 minutes sitting EOB.  Postural control: Posterior lean Standing balance support: Bilateral upper extremity supported Standing balance-Leahy Scale: Zero Standing balance comment: Max +2 required                           ADL either performed or assessed with clinical judgement   ADL Overall ADL's : Needs assistance/impaired Eating/Feeding: Moderate assistance   Grooming: Maximal assistance;Sitting   Upper Body Bathing: Maximal assistance;Sitting   Lower Body Bathing: Maximal assistance;Sitting/lateral leans   Upper Body Dressing : Maximal assistance;Sitting   Lower Body Dressing: Total assistance   Toilet Transfer: Maximal assistance;+2 for physical assistance;+2 for safety/equipment;Stand-pivot   Toileting- Clothing Manipulation and Hygiene: Total assistance       Functional mobility during ADLs: Maximal assistance;+2 for physical assistance;+2 for safety/equipment (for stand pivot only)  Vision Patient Visual Report: Other (comment) (unable to determine baseline)       Perception     Praxis      Pertinent Vitals/Pain Pain  Assessment: Faces Faces Pain Scale: Hurts little more Pain Location: L heel, L UE with light touch at times Pain Descriptors / Indicators: Sore;Grimacing Pain Intervention(s): Monitored during session     Hand Dominance Right   Extremity/Trunk Assessment Upper Extremity Assessment Upper Extremity Assessment: Defer to OT evaluation LUE Deficits / Details: Pt does not move arm on command. Pt with hand in full extension with no ability to close fingers. Pt unable to abduct, but OT could manually bring arm out to about 30 degrees with Pt resisting LUE Sensation:  (Pt claims sensation intact) LUE Coordination: decreased fine motor;decreased gross motor   Lower Extremity Assessment Lower Extremity Assessment: Defer to PT evaluation   Cervical / Trunk Assessment Cervical / Trunk Assessment: Other exceptions Cervical / Trunk Exceptions: Noted prominent forward head/rounded shoulder posture   Communication Communication Communication: Expressive difficulties   Cognition Arousal/Alertness: Awake/alert Behavior During Therapy: WFL for tasks assessed/performed Overall Cognitive Status: No family/caregiver present to determine baseline cognitive functioning Area of Impairment: Following commands;Memory;Safety/judgement;Awareness;Attention;Orientation                 Orientation Level: Time;Place;Disoriented to Current Attention Level: Focused Memory: Decreased short-term memory Following Commands: Follows one step commands with increased time;Follows one step commands inconsistently Safety/Judgement: Decreased awareness of deficits;Decreased awareness of safety Awareness: Intellectual   General Comments: Pt thought it was April when we asked him what month it was, could not etablish history. Did not remember wife's name; but able to correct therapist when called the wrong name   General Comments       Exercises     Shoulder Instructions      Home Living Family/patient  expects to be discharged to:: Unsure Living Arrangements: Spouse/significant other                               Additional Comments: pt was at home with wife per chart      Prior Functioning/Environment Level of Independence:  (unable to obtain ias Pt is not reliable historian)        Comments: no familiy present to establish baseline        OT Problem List: Decreased strength;Decreased range of motion;Decreased activity tolerance;Impaired balance (sitting and/or standing);Impaired vision/perception;Decreased cognition;Decreased safety awareness;Decreased knowledge of precautions;Impaired sensation;Impaired UE functional use;Pain      OT Treatment/Interventions: Self-care/ADL training;Therapeutic exercise;DME and/or AE instruction;Manual therapy;Therapeutic activities;Cognitive remediation/compensation;Visual/perceptual remediation/compensation;Patient/family education;Balance training    OT Goals(Current goals can be found in the care plan section) Acute Rehab OT Goals Patient Stated Goal: Pt did not state goals during session ADL Goals Pt Will Perform Upper Body Bathing: with min assist;sitting Pt Will Perform Lower Body Bathing: with min assist;sitting/lateral leans Pt Will Transfer to Toilet: with mod assist;stand pivot transfer;bedside commode Pt Will Perform Toileting - Clothing Manipulation and hygiene: with min guard assist;with caregiver independent in assisting;sit to/from stand Pt/caregiver will Perform Home Exercise Program: Left upper extremity;With minimal assist;Increased ROM  OT Frequency: Min 2X/week   Barriers to D/C:            Co-evaluation PT/OT/SLP Co-Evaluation/Treatment: Yes Reason for Co-Treatment: Necessary to address cognition/behavior during functional activity;For patient/therapist safety;To address functional/ADL transfers PT goals addressed during session: Mobility/safety with mobility;Balance;Proper use of DME OT goals addressed  during  session: ADL's and self-care      End of Session Equipment Utilized During Treatment: Gait belt Nurse Communication: Mobility status  Activity Tolerance: Patient tolerated treatment well Patient left: in chair;with bed alarm set;Other (comment) (with MD in room)  OT Visit Diagnosis: Unsteadiness on feet (R26.81);Muscle weakness (generalized) (M62.81);Other symptoms and signs involving the nervous system (R29.898);Feeding difficulties (R63.3);Other symptoms and signs involving cognitive function;Cognitive communication deficit (R41.841);Hemiplegia and hemiparesis Symptoms and signs involving cognitive functions: Cerebral infarction Hemiplegia - Right/Left: Left Hemiplegia - dominant/non-dominant: Non-Dominant Hemiplegia - caused by: Cerebral infarction                Time: 1610-9604 OT Time Calculation (min): 19 min Charges:  OT General Charges $OT Visit: 1 Procedure OT Evaluation $OT Eval Moderate Complexity: 1 Procedure G-Codes:     Sherryl Manges OTR/L [phone]  Evern Bio Aishia Barkey 04/21/2016, 1:48 PM

## 2016-04-22 ENCOUNTER — Encounter (HOSPITAL_COMMUNITY): Payer: Self-pay | Admitting: General Practice

## 2016-04-22 LAB — PROTIME-INR
INR: 1.25
Prothrombin Time: 15.8 seconds — ABNORMAL HIGH (ref 11.4–15.2)

## 2016-04-22 MED ORDER — WARFARIN - PHARMACIST DOSING INPATIENT
Freq: Every day | Status: DC
  Administered 2016-04-24: 17:00:00

## 2016-04-22 MED ORDER — WARFARIN SODIUM 5 MG PO TABS: 5.0000 mg | ORAL_TABLET | Freq: Once | ORAL | Status: DC

## 2016-04-22 MED ORDER — POTASSIUM CHLORIDE 20 MEQ PO PACK
20.0000 meq | PACK | Freq: Two times a day (BID) | ORAL | Status: DC
  Administered 2016-04-22 – 2016-04-23 (×2): 20 meq via ORAL
  Filled 2016-04-22 (×3): qty 1

## 2016-04-22 NOTE — Progress Notes (Signed)
ANTICOAGULATION CONSULT NOTE - Initial Consult  Pharmacy Consult for warfarin Indication: atrial fibrillation  No Known Allergies  Patient Measurements: Height:  (180.3 cm) Weight: 136 lb 11.2 oz (62 kg) IBW/kg (Calculated) : 75.3  Vital Signs: Temp: 98.3 F (36.8 C) (03/31 1421) Temp Source: Oral (03/31 1421) BP: 109/66 (03/31 1421) Pulse Rate: 80 (03/31 1421)  Labs:  Recent Labs  04/20/16 1436 04/21/16 0318  HGB 11.9* 10.8*  HCT 36.8* 33.3*  PLT 233 259  CREATININE 1.04 0.87    Estimated Creatinine Clearance: 64.3 mL/min (by C-G formula based on SCr of 0.87 mg/dL).   Medical History: Past Medical History:  Diagnosis Date  . Dementia   . High cholesterol   . Hypertension   . Myocardial infarction   . Stroke [facility])      Assessment: 76 yo male with hx of CVA who was previously on Xarelto was admitted with another acute CVA. He was not taking Xarelto prior to admission secondary to cost. He was restarted on Xarelto on 3/29 and is now being transitioned to warfarin. Last dose of Xarelto was yesterday, this will likely effect his INR. I discussed bridging to warfarin with the MD, the plan is to initiate warfarin without a bridge.   Goal of Therapy:  INR 2-3 Monitor platelets by anticoagulation protocol: Yes    Plan:  -Warfarin 5 mg po x1 -Daily INR    Baldemar Friday 04/22/2016,5:39 PM

## 2016-04-22 NOTE — Progress Notes (Signed)
STROKE TEAM PROGRESS NOTE   HISTORY OF PRESENT ILLNESS (per record) Calvin Diaz is an 76 y.o. male with medical history significant of CVA from Nov 2017, afib, HTN, gouty arthritis, dementia who was admitted 11/17 with slurred speech, found to have a small area of acute ischemia superimposed on the area of encephalomalacia associated with remote right MCA infarct. Patient was seen by Neurology who recommended continued xarelto on discharge. Chart reviewed. On follow up with Neurology on 03/30/16, patient was found to be no longer taking xarelto secondary to cost. Patient returns today with complaints of worsened slurred speech starting 7-8pm on 3/28.  MRI was obtained and did show small focus of acute infarct within the right superolateral parietal lobe at the margins of the prior chronic right MCA infarct.   Patient is a poor historian secondary to his underlying dementia.  Date last known well: Date: 04/19/2016 Time last known well: Time: 19:00 tPA Given: No: recent stroke   SUBJECTIVE (INTERVAL HISTORY) His sister and son are at the bedside.   No complaints   OBJECTIVE Temp:  [98 F (36.7 C)-98.7 F (37.1 C)] 98.4 F (36.9 C) (03/31 0958) Pulse Rate:  [74-83] 80 (03/31 0958) Cardiac Rhythm: Normal sinus rhythm (03/31 0700) Resp:  [17-18] 18 (03/31 0958) BP: (106-155)/(56-69) 106/64 (03/31 0958) SpO2:  [96 %-100 %] 96 % (03/31 0958)  CBC:   Recent Labs Lab 04/20/16 1436 04/21/16 0318  WBC 10.7* 12.4*  HGB 11.9* 10.8*  HCT 36.8* 33.3*  MCV 90.4 89.8  PLT 233 259    Basic Metabolic Panel:   Recent Labs Lab 04/20/16 1436 04/21/16 0318  NA 142 141  K 3.7 3.2*  CL 105 102  CO2 27 29  GLUCOSE 124* 95  BUN 14 11  CREATININE 1.04 0.87  CALCIUM 9.1 9.2    Lipid Panel:     Component Value Date/Time   CHOL 100 12/16/2015 0445   TRIG 47 12/16/2015 0445   HDL 36 (L) 12/16/2015 0445   CHOLHDL 2.8 12/16/2015 0445   VLDL 9 12/16/2015 0445   LDLCALC 55 12/16/2015  0445   HgbA1c:  Lab Results  Component Value Date   HGBA1C 6.2 (H) 12/16/2015   Urine Drug Screen:     Component Value Date/Time   LABOPIA NONE DETECTED 12/15/2015 1535   COCAINSCRNUR NONE DETECTED 12/15/2015 1535   LABBENZ NONE DETECTED 12/15/2015 1535   AMPHETMU NONE DETECTED 12/15/2015 1535   THCU NONE DETECTED 12/15/2015 1535   LABBARB NONE DETECTED 12/15/2015 1535      IMAGING  Ct Head Wo Contrast 04/20/2016 Old bilateral cerebral infarctions. No acute intracranial abnormality seen.    Mr Brain Wo Contrast 04/20/2016 1. Small focus of acute infarction within the right superolateral parietal lobe at the margins of a prior chronic right MCA infarction.  2. Multiple stable chronic infarcts of the supratentorial brain bilaterally.    Mr Brain With Contrast 04/20/2016 1. Small focus of acute infarction within the right superolateral parietal lobe at the margins of a prior chronic right MCA infarction.  2. Multiple stable chronic infarcts of the supratentorial brain bilaterally.    Dg Chest Port 1 View 04/20/2016 1. No active cardiopulmonary disease.  2. Well-circumscribed rounded opacity overlying the medial left hemidiaphragm. Finding suspected to reflect a hernia.  3. Aortic atherosclerosis.   CT Angio Head and Neck 12/15/2015 1. No acute arterial finding. No embolic source or flow limiting stenosis in the right anterior circulation to explain acute infarcts. 2. Moderate left  V1 segment stenosis. 3. Advanced chronic ischemic injury which obscures the acute right MCA territory infarct by CT. No hemorrhagic conversion or evolution since brain MRI yesterday. 4. Patchy apical lung opacities that are nonspecific. Some contain central calcifications on the right, favoring remote infection. Recommend chest x-ray correlation to ensure no acute opacities compared to recent priors. If clinically appropriate, chest CT in 6 months could follow-up the small nodular  areas.    Chest X-ray 2 View  02/06/2016 Cardiac silhouette is normal in size. No mediastinal or hilar masses. No evidence of adenopathy. The lungs are hyperexpanded but clear. No pleural effusion. No pneumothorax. Skeletal structures are demineralized but grossly intact. Impression No acute cardiopulmonary disease.    PHYSICAL EXAM Frail elderly male not in distress. . Afebrile. Head is nontraumatic. Neck is supple without bruit.    Cardiac exam no murmur or gallop. Lungs are clear to auscultation. Distal pulses are well felt.  Neurological Exam :  Awake alert oriented x 3 normal speech and language. Mild left lower face asymmetry. Tongue midline. Spastic left hemiparesis with mild left upper extremity   drift. Left upper extremity 3/5 strength with significant weakness of the grip and hand. Left lower extremity 4/5 strength. Tone is increased on the left side. Normal sensation . Impaired left sided coordination. Gait was deferred   ASSESSMENT/PLAN Calvin Diaz is a 76 y.o. male with history of a previous stroke, atrial fibrillation (non compliant with Xarelto 2/2 cost), coronary artery disease with MI, gout, hypertension, hyperlipidemia, and dementia presenting with slurred speech. He did not receive IV t-PA due to recent stroke.  Stroke:  Small focus of acute infarction within the right superolateral parietal lobe - embolic - afib. Noncompliant with medication  Resultant  increase gait difficulty  MRI - Small focus of acute infarction within the right superolateral parietal lobe. Multiple old infarcts.  MRA - not performed. CTA H&N Nov 2017.  Carotid Doppler - CTA neck 12/15/2015  2D Echo - 09/08/2015 - EF 60 - 65 %  LDL - 55 (12/16/15) - recheck  HgbA1c - 6.2 (12/16/15) - recheck  VTE prophylaxis - Xarelto Diet Heart Room service appropriate? Yes; Fluid consistency: Thin  clopidogrel 75 mg daily prior to admission, now on clopidogrel 75 mg daily and Xarelto  (rivaroxaban) daily  Patient counseled to be compliant with his antithrombotic medications  Ongoing aggressive stroke risk factor management  Therapy recommendations:  SNF recommended.  Disposition: Pending  Hypertension  BP tends to run low on Norvasc 5 mg daily. May not need Norvasc or consider 2.5 mg daily.  Permissive hypertension (OK if < 220/120) but gradually normalize in 5-7 days  Long-term BP goal normotensive  Hyperlipidemia  Home meds:  Pravachol 80 mg daily resumed in hospital  LDL 55, goal < 70  Continue statin at discharge  Diabetes  HgbA1c 6.2, goal < 7.0  Controlled  Other Stroke Risk Factors  Advanced age  Former cigarette smoker - quit  Hx stroke/TIA  Family hx stroke (mother)  Coronary artery disease  Gout  Afib  Other Active Problems  Hypokalemia - 3.2 - supplement and recheck  Anemia - 10.8 / 33.3  Leukocytosis - 12.4 (afebrile)  Mild hypotension  Hospital day # 1  I have personally examined this patient, reviewed notes, independently viewed imaging studies, participated in medical decision making and plan of care.ROS completed by me personally and pertinent positives fully documented  I have made any additions or clarifications directly to the above note. I  had a long discussion the patient and his family regards to the importance of being compliant with his anticoagulation regimen. Family informs me that patient would not be able to afford Xarelto. I recommend switching to warfarin instead which he should be able to afford. I explained to them the need for frequent lab work and strict diet adherence and they are in agreement. Greater than 50% time during this 35 minute visit was spent on counseling and coordination of care about his atrial fibrillation, stroke, discussion of treatment options and answering questions. Discussed with Dr. Susie Cassette . Delia Heady, MD Medical Director [facility] Pager: [phone] 04/22/2016  4:22 PM   To contact Stroke Continuity provider, please refer to [email or web address]. After hours, contact General Neurology

## 2016-04-22 NOTE — Progress Notes (Signed)
[facility] PROGRESS NOTE  Calvin Diaz ZOX:[id number] DOB: 1941-01-15 DOA: 04/20/2016   PCP: [facility]     Assessment/Plan: Active Problems:   History of CVA with residual deficit   Hyperlipidemia   Essential hypertension   Atrial fibrillation (HCC)   Dementia   CVA (cerebral vascular accident) (HCC)   Leukocytosis   Polyarticular arthritis   Stroke [facility])   Acute CVA (cerebrovascular accident) (HCC)   76 y.o. male with medical history significant of CVA from 11/18, afib, HTN, gouty arthritis, dementia who was admitted 11/17 with slurred speech, found to have a small area of acute ischemia superimposed on the area of encephalomalacia associated with remote right MCA infarct.Patient was seen by Neurology who recommended continued xarelto on discharge. Chart reviewed. On follow up with Neurology on 03/30/16, patient was found to be no longer taking xarelto secondary to cost. Patient returns today with complaints of worsened slurred speech starting 7-8pm on 3/28 with generalized weakness.. Patient admitted for stroke workup  Assessment and plan Acute CVA Small focus of acute infarction within the right superolateral parietal lobe at the margins of a prior chronic right MCA infarction.2. Multiple stable chronic infarcts of the supratentorial brain bilaterally.Resumed Xarelto, ok to continue concomitant plavix (discussed with Dr Pearlean Brownie) We will consult neurology PT/OT-SNF , /SLP-regular, thin liquids Continue statin CIR consult placed per neuro recommendations   Dyslipidemia: LDL 55-continue statin   Persistent Atrial fibrillation:Back in sinus rhythm-Not on any rate control medications, continue Xarelto.CHADSVASC score of 4. Was seen by cardiology as an outpatient on 11/15-planswere forr DCCV-after 4 weeks of uninterrupted Xarelto therapy-followed by Rush Landmark, MD, last seen 04/11/16  Hypertension:Hold amlodipine and continue  Terazosin, to allow for permissive hypertension  Dementia with mild delirium:Continue Namenda.  Chronic insomnia:Resume belsomra as outpatient  Patchy apical lung opacities:Seen on CT angiogram of the neck, no obvious infiltrates seen on chest x-ray. Radiology recommends to repeat CT in 6 months.   Peripheral vascular disease Doppler studies performed in our office 03/01/16 revealed a right ABI 0.95 and a left of 1.1 with three-vessel runoff bilaterally. He did have a moderate lesion in his distal right SFA. He is minimally ambulatory with the aid of a cane and assistance from his wife. He denies claudication. He does have a palpable pedal pulse on the right, on Plavix  DVT prophylaxsis xarelto  Code Status:  DNR     Family Communication: Discussed in detail with the patient, all imaging results, lab results explained to the patient   Disposition Plan:   Anticipate discharge in one to 2 days      Consultants:  Neurology  Procedures:  None  Antibiotics: Anti-infectives    None         HPI/Subjective:  poor historian secondary to his underlying dementia  Objective: Vitals:   04/21/16 2142 04/22/16 0100 04/22/16 0445 04/22/16 0958  BP: (!) 125/56 117/64 110/63 106/64  Pulse: 80 81 83 80  Resp: Temp: 98.5 F (36.9 C) 98 F (36.7 C) 98.7 F (37.1 C) 98.4 F (36.9 C)  TempSrc: Oral Oral Oral Oral  SpO2: 100% 100% 98% 96%  Weight:      Height:        Intake/Output Summary (Last 24 hours) at 04/22/16 1310 Last data filed at 04/22/16 0600  Gross per 24 hour  Intake              270 ml  Output                0 ml  Net              270 ml    Exam:  Examination:  General exam: Appears calm and comfortable  Respiratory system: Clear to auscultation. Respiratory effort normal. Cardiovascular system: S1 & S2 heard, RRR. No JVD, murmurs, rubs, gallops or clicks. No pedal edema. Gastrointestinal system: Abdomen is nondistended, soft and  nontender. No organomegaly or masses felt. Normal bowel sounds heard. Central nervous system: Alert  , expressive aphasia  Extremities: Symmetric 5 x 5 power. Skin: No rashes, lesions or ulcers Psychiatry: Judgement imapired     Data Reviewed: I have personally reviewed following labs and imaging studies  Micro Results Recent Results (from the past 240 hour(s))  Blood Culture (routine x 2)     Status: None (Preliminary result)   Collection Time: 04/20/16  5:14 PM  Result Value Ref Range Status   Specimen Description BLOOD RIGHT ANTECUBITAL  Final   Special Requests BOTTLES DRAWN AEROBIC AND ANAEROBIC 5CC  Final   Culture NO GROWTH < 24 HOURS  Final   Report Status PENDING  Incomplete  Blood Culture (routine x 2)     Status: None (Preliminary result)   Collection Time: 04/20/16  5:20 PM  Result Value Ref Range Status   Specimen Description BLOOD RIGHT FOREARM  Final   Special Requests BOTTLES DRAWN AEROBIC AND ANAEROBIC 5CC  Final   Culture NO GROWTH < 24 HOURS  Final   Report Status PENDING  Incomplete    Radiology Reports Ct Head Wo Contrast  Result Date: 04/20/2016 CLINICAL DATA:  Slurred speech. EXAM: CT HEAD WITHOUT CONTRAST TECHNIQUE: Contiguous axial images were obtained from the base of the skull through the vertex without intravenous contrast. COMPARISON:  CT scan of February 06, 2016. FINDINGS: Brain: Stable encephalomalacia is seen involving the right MCA territory consistent with old infarction. Stable left frontal encephalomalacia is noted consistent with old infarction. No mass effect or midline shift is noted. Ventricular size is within normal limits. There is no evidence of mass lesion, hemorrhage or acute infarction. Vascular: No hyperdense vessel or unexpected calcification. Skull: Normal. Negative for fracture or focal lesion. Sinuses/Orbits: No acute finding. Other: None. IMPRESSION: Old bilateral cerebral infarctions. No acute intracranial abnormality seen.  Electronically Signed   By: Lupita Raider, M.D.   On: 04/20/2016 15:40   Mr Brain Wo Contrast  Result Date: 04/20/2016 CLINICAL DATA:  History of stroke with worsening slurred speech and generalized weakness. EXAM: MRI HEAD WITH AND WITHOUT CONTRAST TECHNIQUE: Multiplanar, multiecho pulse sequences of the brain and surrounding structures were obtained with and without intravenous contrast. CONTRAST:  14mL MULTIHANCE GADOBENATE DIMEGLUMINE 529 MG/ML IV SOLN COMPARISON:  12/14/2015 MRI of the head. 04/20/2016 and CT of the head. FINDINGS: Brain: Small focus of diffusion restriction within the right superolateral parietal lobe at the margins of an area of encephalomalacia from prior infarction compatible with interval acute infarct. There are multiple additional areas of encephalomalacia within the right occipital lobe, right posterior temporal lobe, bilateral frontal lobes, and right insula compatible with chronic infarction. The infarctions in the right posterior temporal, right parietal, and bilateral superolateral frontal lobes demonstrate susceptibility hypointensity compatible with hemosiderin deposition from prior hemorrhage. This is most pronounced in the right superolateral frontal lobe (series 9, image 74) where the prominent susceptibility artifact gives the appearance of mass effect. The signal pattern is unchanged when  compared with the prior MRI of the brain. There is no evidence of extra-axial collection, hydrocephalus, or effacement of basilar cisterns. After administration of intravenous contrast there is no abnormal enhancement of the brain. There is slight smooth pachymeningeal thickening which is likely the result of volume loss in the brain and altered flow dynamics post multiple infarctions. Vascular: Normal flow voids. Skull and upper cervical spine: Normal marrow signal. Sinuses/Orbits: Mild maxillary sinus mucosal thickening. Otherwise negative. Other: None. IMPRESSION: 1. Small focus of  acute infarction within the right superolateral parietal lobe at the margins of a prior chronic right MCA infarction. 2. Multiple stable chronic infarcts of the supratentorial brain bilaterally. Electronically Signed   By: Mitzi Hansen M.D.   On: 04/20/2016 22:41   Mr Brain W Contrast  Result Date: 04/20/2016 CLINICAL DATA:  History of stroke with worsening slurred speech and generalized weakness. EXAM: MRI HEAD WITH AND WITHOUT CONTRAST TECHNIQUE: Multiplanar, multiecho pulse sequences of the brain and surrounding structures were obtained with and without intravenous contrast. CONTRAST:  14mL MULTIHANCE GADOBENATE DIMEGLUMINE 529 MG/ML IV SOLN COMPARISON:  12/14/2015 MRI of the head. 04/20/2016 and CT of the head. FINDINGS: Brain: Small focus of diffusion restriction within the right superolateral parietal lobe at the margins of an area of encephalomalacia from prior infarction compatible with interval acute infarct. There are multiple additional areas of encephalomalacia within the right occipital lobe, right posterior temporal lobe, bilateral frontal lobes, and right insula compatible with chronic infarction. The infarctions in the right posterior temporal, right parietal, and bilateral superolateral frontal lobes demonstrate susceptibility hypointensity compatible with hemosiderin deposition from prior hemorrhage. This is most pronounced in the right superolateral frontal lobe (series 9, image 74) where the prominent susceptibility artifact gives the appearance of mass effect. The signal pattern is unchanged when compared with the prior MRI of the brain. There is no evidence of extra-axial collection, hydrocephalus, or effacement of basilar cisterns. After administration of intravenous contrast there is no abnormal enhancement of the brain. There is slight smooth pachymeningeal thickening which is likely the result of volume loss in the brain and altered flow dynamics post multiple infarctions.  Vascular: Normal flow voids. Skull and upper cervical spine: Normal marrow signal. Sinuses/Orbits: Mild maxillary sinus mucosal thickening. Otherwise negative. Other: None. IMPRESSION: 1. Small focus of acute infarction within the right superolateral parietal lobe at the margins of a prior chronic right MCA infarction. 2. Multiple stable chronic infarcts of the supratentorial brain bilaterally. Electronically Signed   By: Mitzi Hansen M.D.   On: 04/20/2016 22:41   Dg Chest Port 1 View  Result Date: 04/20/2016 CLINICAL DATA:  Initial evaluation for increase generalized weakness, slurred speech. EXAM: PORTABLE CHEST 1 VIEW COMPARISON:  Prior radiograph from 02/06/2016. FINDINGS: Cardiac and mediastinal silhouettes are stable in size and contour, and remain within normal limits. Aortic atherosclerosis noted. Lungs normally inflated. No focal infiltrates. No pulmonary edema. No definite pleural effusion, although the costophrenic angles are incompletely visualized. No pneumothorax. No acute osseous abnormality. Rounded opacity overlying the medial left hemidiaphragm suspected to reflect a a hernia. IMPRESSION: 1. No active cardiopulmonary disease. 2. Well-circumscribed rounded opacity overlying the medial left hemidiaphragm. Finding suspected to reflect a hernia. 3. Aortic atherosclerosis. Electronically Signed   By: Rise Mu M.D.   On: 04/20/2016 17:14     CBC  Recent Labs Lab 04/20/16 1436 04/21/16 0318  WBC 10.7* 12.4*  HGB 11.9* 10.8*  HCT 36.8* 33.3*  PLT 233 259  MCV 90.4 89.8  MCH 29.2 29.1  MCHC 32.3 32.4  RDW 14.6 14.5    Chemistries   Recent Labs Lab 04/20/16 1436 04/21/16 0318  NA 142 141  K 3.7 3.2*  CL 105 102  CO2 27 29  GLUCOSE 124* 95  BUN 14 11  CREATININE 1.04 0.87  CALCIUM 9.1 9.2  AST 28 30  ALT 11* 12*  ALKPHOS 50 46  BILITOT 0.5 0.8    ------------------------------------------------------------------------------------------------------------------ estimated creatinine clearance is 64.3 mL/min (by C-G formula based on SCr of 0.87 mg/dL). ------------------------------------------------------------------------------------------------------------------ No results for input(s): HGBA1C in the last 72 hours. ------------------------------------------------------------------------------------------------------------------ No results for input(s): CHOL, HDL, LDLCALC, TRIG, CHOLHDL, LDLDIRECT in the last 72 hours. ------------------------------------------------------------------------------------------------------------------ No results for input(s): TSH, T4TOTAL, T3FREE, THYROIDAB in the last 72 hours.  Invalid input(s): FREET3 ------------------------------------------------------------------------------------------------------------------ No results for input(s): VITAMINB12, FOLATE, FERRITIN, TIBC, IRON, RETICCTPCT in the last 72 hours.  Coagulation profile No results for input(s): INR, PROTIME in the last 168 hours.  No results for input(s): DDIMER in the last 72 hours.  Cardiac Enzymes No results for input(s): CKMB, TROPONINI, MYOGLOBIN in the last 168 hours.  Invalid input(s): CK ------------------------------------------------------------------------------------------------------------------ Invalid input(s): POCBNP   CBG: No results for input(s): GLUCAP in the last 168 hours.     Studies: Ct Head Wo Contrast  Result Date: 04/20/2016 CLINICAL DATA:  Slurred speech. EXAM: CT HEAD WITHOUT CONTRAST TECHNIQUE: Contiguous axial images were obtained from the base of the skull through the vertex without intravenous contrast. COMPARISON:  CT scan of February 06, 2016. FINDINGS: Brain: Stable encephalomalacia is seen involving the right MCA territory consistent with old infarction. Stable left frontal encephalomalacia  is noted consistent with old infarction. No mass effect or midline shift is noted. Ventricular size is within normal limits. There is no evidence of mass lesion, hemorrhage or acute infarction. Vascular: No hyperdense vessel or unexpected calcification. Skull: Normal. Negative for fracture or focal lesion. Sinuses/Orbits: No acute finding. Other: None. IMPRESSION: Old bilateral cerebral infarctions. No acute intracranial abnormality seen. Electronically Signed   By: Lupita Raider, M.D.   On: 04/20/2016 15:40   Mr Brain Wo Contrast  Result Date: 04/20/2016 CLINICAL DATA:  History of stroke with worsening slurred speech and generalized weakness. EXAM: MRI HEAD WITH AND WITHOUT CONTRAST TECHNIQUE: Multiplanar, multiecho pulse sequences of the brain and surrounding structures were obtained with and without intravenous contrast. CONTRAST:  14mL MULTIHANCE GADOBENATE DIMEGLUMINE 529 MG/ML IV SOLN COMPARISON:  12/14/2015 MRI of the head. 04/20/2016 and CT of the head. FINDINGS: Brain: Small focus of diffusion restriction within the right superolateral parietal lobe at the margins of an area of encephalomalacia from prior infarction compatible with interval acute infarct. There are multiple additional areas of encephalomalacia within the right occipital lobe, right posterior temporal lobe, bilateral frontal lobes, and right insula compatible with chronic infarction. The infarctions in the right posterior temporal, right parietal, and bilateral superolateral frontal lobes demonstrate susceptibility hypointensity compatible with hemosiderin deposition from prior hemorrhage. This is most pronounced in the right superolateral frontal lobe (series 9, image 74) where the prominent susceptibility artifact gives the appearance of mass effect. The signal pattern is unchanged when compared with the prior MRI of the brain. There is no evidence of extra-axial collection, hydrocephalus, or effacement of basilar cisterns. After  administration of intravenous contrast there is no abnormal enhancement of the brain. There is slight smooth pachymeningeal thickening which is likely the result of volume loss in the brain and altered flow dynamics post multiple infarctions. Vascular:  Normal flow voids. Skull and upper cervical spine: Normal marrow signal. Sinuses/Orbits: Mild maxillary sinus mucosal thickening. Otherwise negative. Other: None. IMPRESSION: 1. Small focus of acute infarction within the right superolateral parietal lobe at the margins of a prior chronic right MCA infarction. 2. Multiple stable chronic infarcts of the supratentorial brain bilaterally. Electronically Signed   By: Mitzi Hansen M.D.   On: 04/20/2016 22:41   Mr Brain W Contrast  Result Date: 04/20/2016 CLINICAL DATA:  History of stroke with worsening slurred speech and generalized weakness. EXAM: MRI HEAD WITH AND WITHOUT CONTRAST TECHNIQUE: Multiplanar, multiecho pulse sequences of the brain and surrounding structures were obtained with and without intravenous contrast. CONTRAST:  14mL MULTIHANCE GADOBENATE DIMEGLUMINE 529 MG/ML IV SOLN COMPARISON:  12/14/2015 MRI of the head. 04/20/2016 and CT of the head. FINDINGS: Brain: Small focus of diffusion restriction within the right superolateral parietal lobe at the margins of an area of encephalomalacia from prior infarction compatible with interval acute infarct. There are multiple additional areas of encephalomalacia within the right occipital lobe, right posterior temporal lobe, bilateral frontal lobes, and right insula compatible with chronic infarction. The infarctions in the right posterior temporal, right parietal, and bilateral superolateral frontal lobes demonstrate susceptibility hypointensity compatible with hemosiderin deposition from prior hemorrhage. This is most pronounced in the right superolateral frontal lobe (series 9, image 74) where the prominent susceptibility artifact gives the  appearance of mass effect. The signal pattern is unchanged when compared with the prior MRI of the brain. There is no evidence of extra-axial collection, hydrocephalus, or effacement of basilar cisterns. After administration of intravenous contrast there is no abnormal enhancement of the brain. There is slight smooth pachymeningeal thickening which is likely the result of volume loss in the brain and altered flow dynamics post multiple infarctions. Vascular: Normal flow voids. Skull and upper cervical spine: Normal marrow signal. Sinuses/Orbits: Mild maxillary sinus mucosal thickening. Otherwise negative. Other: None. IMPRESSION: 1. Small focus of acute infarction within the right superolateral parietal lobe at the margins of a prior chronic right MCA infarction. 2. Multiple stable chronic infarcts of the supratentorial brain bilaterally. Electronically Signed   By: Mitzi Hansen M.D.   On: 04/20/2016 22:41   Dg Chest Port 1 View  Result Date: 04/20/2016 CLINICAL DATA:  Initial evaluation for increase generalized weakness, slurred speech. EXAM: PORTABLE CHEST 1 VIEW COMPARISON:  Prior radiograph from 02/06/2016. FINDINGS: Cardiac and mediastinal silhouettes are stable in size and contour, and remain within normal limits. Aortic atherosclerosis noted. Lungs normally inflated. No focal infiltrates. No pulmonary edema. No definite pleural effusion, although the costophrenic angles are incompletely visualized. No pneumothorax. No acute osseous abnormality. Rounded opacity overlying the medial left hemidiaphragm suspected to reflect a a hernia. IMPRESSION: 1. No active cardiopulmonary disease. 2. Well-circumscribed rounded opacity overlying the medial left hemidiaphragm. Finding suspected to reflect a hernia. 3. Aortic atherosclerosis. Electronically Signed   By: Rise Mu M.D.   On: 04/20/2016 17:14      Lab Results  Component Value Date   HGBA1C 6.2 (H) 12/16/2015   Lab Results   Component Value Date   LDLCALC 55 12/16/2015   CREATININE 0.87 04/21/2016       Scheduled Meds: . amLODipine  5 mg Oral Daily  . citalopram  20 mg Oral Daily  . clopidogrel  75 mg Oral Daily  . colchicine  0.6 mg Oral Daily  . divalproex  500 mg Oral BID  . memantine  10 mg Oral Daily  . pravastatin  80 mg Oral QHS  . rivaroxaban  20 mg Oral Q supper  . sodium chloride flush  3 mL Intravenous Q12H  . terazosin  1 mg Oral QHS   Continuous Infusions: . sodium chloride    . dextrose 5 % and 0.9% NaCl       LOS: 1 day    Time spent: >30 MINS    [facility]  [facility] Pager [phone]. If 7PM-7AM, please contact night-coverage at [email or web address], password [facility] 04/22/2016, 1:10 PM  LOS: 1 day

## 2016-04-23 DIAGNOSIS — I6300 Cerebral infarction due to thrombosis of unspecified precerebral artery: Secondary | ICD-10-CM

## 2016-04-23 DIAGNOSIS — R269 Unspecified abnormalities of gait and mobility: Secondary | ICD-10-CM

## 2016-04-23 DIAGNOSIS — F0150 Vascular dementia without behavioral disturbance: Secondary | ICD-10-CM

## 2016-04-23 DIAGNOSIS — I69398 Other sequelae of cerebral infarction: Secondary | ICD-10-CM

## 2016-04-23 LAB — BASIC METABOLIC PANEL
Anion gap: 9 (ref 5–15)
BUN: 15 mg/dL (ref 6–20)
CO2: 29 mmol/L (ref 22–32)
CREATININE: 0.94 mg/dL (ref 0.61–1.24)
Calcium: 8.9 mg/dL (ref 8.9–10.3)
Chloride: 100 mmol/L — ABNORMAL LOW (ref 101–111)
GFR calc Af Amer: >60 mL/min (ref >60)
GLUCOSE: 99 mg/dL (ref 65–99)
POTASSIUM: 3.7 mmol/L (ref 3.5–5.1)
SODIUM: 138 mmol/L (ref 135–145)

## 2016-04-23 LAB — LIPID PANEL
CHOL/HDL RATIO: 2.7 ratio
CHOLESTEROL: 104 mg/dL (ref 0–200)
HDL: 39 mg/dL — ABNORMAL LOW (ref >40)
LDL Cholesterol: 54 mg/dL (ref 0–99)
Triglycerides: 55 mg/dL (ref <150)
VLDL: 11 mg/dL (ref 0–40)

## 2016-04-23 LAB — CBC
HCT: 32.8 % — ABNORMAL LOW (ref 39.0–52.0)
Hemoglobin: 10.7 g/dL — ABNORMAL LOW (ref 13.0–17.0)
MCH: 29 pg (ref 26.0–34.0)
MCHC: 32.6 g/dL (ref 30.0–36.0)
MCV: 88.9 fL (ref 78.0–100.0)
PLATELETS: 289 10*3/uL (ref 150–400)
RBC: 3.69 MIL/uL — AB (ref 4.22–5.81)
RDW: 14.1 % (ref 11.5–15.5)
WBC: 7.6 10*3/uL (ref 4.0–10.5)

## 2016-04-23 LAB — PROTIME-INR
INR: 1.19
Prothrombin Time: 15.2 seconds (ref 11.4–15.2)

## 2016-04-23 MED ORDER — POTASSIUM CHLORIDE 20 MEQ PO PACK
40.0000 meq | PACK | Freq: Once | ORAL | Status: AC
  Filled 2016-04-23: qty 2

## 2016-04-23 MED ORDER — WARFARIN SODIUM 5 MG PO TABS
5.0000 mg | ORAL_TABLET | Freq: Once | ORAL | Status: AC
  Administered 2016-04-23: 5 mg via ORAL
  Filled 2016-04-23: qty 1

## 2016-04-23 NOTE — Consult Note (Signed)
Physical Medicine and Rehabilitation Consult Reason for Consult:functional decline after stroke Referring Phsyician: Calvin Diaz is an 76 y.o. male.   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 It was admitted 04/20/2016 with slurring of speech. Initial CT demonstrated prior right MCA stroke. MRI did show acute infarct in the right superior lateral parietal lobe at the margin of the prior right MCA infarct. Did not receive TPA due to prior infarct. Patient was off Xarelto, question whether this was a new embolic infarct versus extension of prior. OT evaluation plus to max assist, transfers, cognitive deficits noted, SNF recommended. Plus to max assist noted by PT, disorientation noted, SNF recommended.    Pt denies any distress.  He knows he is in a hospital in [place] but not why, not oriented to time ("Tuesday")  Review of Systems - unable to obtain due to cognition  Past Medical History:  Diagnosis Date  . Dementia   . High cholesterol   . Hypertension   . Myocardial infarction   . Stroke [facility])    Past Surgical History:  Procedure Laterality Date  . CORONARY ANGIOPLASTY     Family History  Problem Relation Age of Onset  . Arrhythmia Mother     Had pacemaker  . Stroke Mother   . Stroke Maternal Aunt   . Heart attack Father    Social History:  reports that he has quit smoking. His smoking use included Cigarettes. He quit smokeless tobacco use about 8 months ago. He reports that he does not drink alcohol or use drugs. Allergies: No Known Allergies Medications Prior to Admission  Medication Sig Dispense Refill  . amLODipine (NORVASC) 5 MG tablet Take 1 tablet (5 mg total) by mouth daily. 90 tablet 3  . citalopram (CELEXA) 20 MG tablet Take 1 tablet (20 mg total) by mouth daily. 30 tablet 0  . clopidogrel (PLAVIX) 75 MG tablet Take 1 tablet (75 mg total) by mouth daily. 30 tablet  11  . colchicine 0.6 MG tablet Take 1 tablet (0.6 mg total) by mouth daily. 30 tablet 0  . divalproex (DEPAKOTE SPRINKLES) 125 MG capsule Take 4 capsules (500 mg total) by mouth 2 (two) times daily. 240 capsule 11  . memantine (NAMENDA) 5 MG tablet Take 2 tablets (10 mg total) by mouth daily. 90 tablet 3  . Multiple Vitamin (MULTIVITAMIN WITH MINERALS) TABS tablet Take 1 tablet by mouth daily.    . pravastatin (PRAVACHOL) 80 MG tablet Take 1 tablet (80 mg total) by mouth at bedtime. 90 tablet 3  . terazosin (HYTRIN) 1 MG capsule Take 1 capsule (1 mg total) by mouth at bedtime. 30 capsule 5    Home: Home Living Family/patient expects to be discharged to:: Unsure Living Arrangements: Spouse/significant other Additional Comments: pt was at home with wife per chart  Functional History: Prior Function Comments: no familiy present to establish baseline Functional Status:  Mobility:     Ambulation/Gait General Gait Details: Unable at this time.     ADL:    Cognition: Cognition Overall Cognitive Status: No family/caregiver present to determine baseline cognitive functioning Orientation Level: Oriented to person, Oriented to place, Disoriented to time, Disoriented to situation Cognition Arousal/Alertness: Awake/alert Behavior During Therapy: WFL for tasks assessed/performed Overall Cognitive Status: No family/caregiver present to determine baseline cognitive functioning Area of Impairment: Following commands, Memory, Safety/judgement, Awareness, Attention, Orientation Orientation Level: Time, Place, Disoriented to Current Attention Level: Focused Memory: Decreased  short-term memory Following Commands: Follows one step commands with increased time, Follows one step commands inconsistently Safety/Judgement: Decreased awareness of deficits, Decreased awareness of safety Awareness: Intellectual General Comments: Pt thought it was April when we asked him what month it was, could not  etablish history. Did not remember wife's name; but able to correct therapist when called the wrong name  Blood pressure 111/66, pulse 85, temperature 97.8 F (36.6 C), temperature source Oral, resp. rate 18, height  (1.803 m), weight 62 kg (136 lb 11.2 oz), SpO2 100 %. Physical Exam  Nursing note and vitals reviewed. Constitutional: He is oriented to person, place, and time. He appears well-developed and well-nourished. No distress.  HENT:  Head: Normocephalic and atraumatic.  Eyes: Conjunctivae and EOM are normal. Pupils are equal, round, and reactive to light. No scleral icterus.  Neck: Normal range of motion. Neck supple. No JVD present.  Cardiovascular: Normal rate, regular rhythm and normal heart sounds.   No murmur heard. Respiratory: Effort normal and breath sounds normal. No stridor. No respiratory distress. He has no wheezes. He has no rales.  GI: Soft. Bowel sounds are normal. He exhibits no distension. There is no tenderness.  Musculoskeletal: He exhibits no edema, tenderness or deformity.  Decreased range of motion left shoulder with external rotation, flexion and abduction approximately less than 25%  Neurological: He is alert and oriented to person, place, and time. He displays no tremor. No sensory deficit. He exhibits abnormal muscle tone. Coordination abnormal.  Sensation intact to light touch bilateral lower limbs. Left neglect. On visual confrontation testing  Rigidity. Left upper extremity. No cogwheeling, resists movement at the wrist, elbow and shoulder   Psychiatric: He has a normal mood and affect. His speech is normal. He is slowed and withdrawn. Cognition and memory are impaired.   motor strength is 4 plus in the right deltoid, bicep, tricep, grip, hip flexion, knee extension, ankle dorsiflexion. Does not cooperate with testing of the left upper extremity keeps his shoulder adductor and, elbow flexed at 90, fingers and wrist extended at neutral resists  passive movement. Left lower extremity has 3 minus, hip flexion, knee extension, ankle dorsiflexion  Results for orders placed or performed during the hospital encounter of 04/20/16 (from the past 24 hour(s))  Protime-INR     Status: Abnormal   Collection Time: 04/22/16  6:44 PM  Result Value Ref Range   Prothrombin Time 15.8 (H) 11.4 - 15.2 seconds   INR 1.25   CBC     Status: Abnormal   Collection Time: 04/23/16  4:00 AM  Result Value Ref Range   WBC 7.6 4.0 - 10.5 K/uL   RBC 3.69 (L) 4.22 - 5.81 MIL/uL   Hemoglobin 10.7 (L) 13.0 - 17.0 g/dL   HCT 21.3 (L) 08.6 - 57.8 %   MCV 88.9 78.0 - 100.0 fL   MCH 29.0 26.0 - 34.0 pg   MCHC 32.6 30.0 - 36.0 g/dL   RDW 46.9 62.9 - 52.8 %   Platelets 289 150 - 400 K/uL  Basic metabolic panel     Status: Abnormal   Collection Time: 04/23/16  4:00 AM  Result Value Ref Range   Sodium 138 135 - 145 mmol/L   Potassium 3.7 3.5 - 5.1 mmol/L   Chloride 100 (L) 101 - 111 mmol/L   CO2 29 22 - 32 mmol/L   Glucose, Bld 99 65 - 99 mg/dL   BUN 15 6 - 20 mg/dL   Creatinine, Ser 4.13 0.61 -  1.24 mg/dL   Calcium 8.9 8.9 - 16.1 mg/dL   GFR calc non Af Amer >60 >60 mL/min   GFR calc Af Amer >60 >60 mL/min   Anion gap 9 5 - 15  Lipid panel     Status: Abnormal   Collection Time: 04/23/16  4:00 AM  Result Value Ref Range   Cholesterol 104 0 - 200 mg/dL   Triglycerides 55 <096 mg/dL   HDL 39 (L) >04 mg/dL   Total CHOL/HDL Ratio 2.7 RATIO   VLDL 11 0 - 40 mg/dL   LDL Cholesterol 54 0 - 99 mg/dL  Protime-INR     Status: None   Collection Time: 04/23/16  4:00 AM  Result Value Ref Range   Prothrombin Time 15.2 11.4 - 15.2 seconds   INR 1.19    No results found.  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 1. Does the need for close, 24 hr/day medical supervision in concert with the patient's rehab needs make it unreasonable for this patient to be served in a  less intensive setting? Potentially 2. Co-Morbidities requiring supervision/potential complications: Multi-infarct dementia, history of atrial fibrillation, history of hypertension 3. Due to bladder management, bowel management, safety, skin/wound care, disease management, medication administration, pain management and patient education, does the patient require 24 hr/day rehab nursing? Potentially 4. Does the patient require coordinated care of a physician, rehab nurse, PT (1-2 hrs/day, 5 days/week), OT (1-2 hrs/day, 5 days/week) and SLP (.5-1 hrs/day, 5 days/week) to address physical and functional deficits in the context of the above medical diagnosis(es)? Potentially Addressing deficits in the following areas: balance, endurance, locomotion, strength, transferring, bowel/bladder control, bathing, dressing, feeding, grooming, toileting, cognition, speech and psychosocial support 5. Can the patient actively participate in an intensive therapy program of at least 3 hrs of therapy per day at least 5 days per week? No 6. The potential for patient to make measurable gains while on inpatient rehab is poor 7. Anticipated functional outcomes upon discharge from inpatients are NA PT, NA OT, NASLP 8. Estimated rehab length of stay to reach the above functional goals is: NA 9. Does the patient have adequate social supports to accommodate these discharge functional goals? Potentially 10. Anticipated D/C setting: Other 11. Anticipated post D/C treatments: N/A 12. Overall Rehab/Functional Prognosis: fair  RECOMMENDATIONS: This patient's condition is appropriate for continued rehabilitative care in the following setting: SNF Patient has agreed to participate in recommended program. N/A Note that insurance prior authorization may be required for reimbursement for recommended care.  Comment: Based on rigidity, severe left neglect, severe cognitive deficits, do not think he would progress adequately in a  intensive rehabilitation program. Would be helpful to know baseline cognitive status and if he suffered a significant decline with the most recent stroke.   Erick Colace 04/23/2016

## 2016-04-23 NOTE — Progress Notes (Signed)
ANTICOAGULATION CONSULT NOTE   Pharmacy Consult for warfarin Indication: atrial fibrillation  No Known Allergies  Patient Measurements: Height:  (180.3 cm) Weight: 136 lb 11.2 oz (62 kg) IBW/kg (Calculated) : 75.3  Vital Signs: Temp: 98 F (36.7 C) (04/01 1023) Temp Source: Oral (04/01 1023) BP: 124/55 (04/01 1023) Pulse Rate: 80 (04/01 1023)  Labs:  Recent Labs  04/20/16 1436 04/21/16 0318 04/22/16 1844 04/23/16 0400  HGB 11.9* 10.8*  --  10.7*  HCT 36.8* 33.3*  --  32.8*  PLT 233 259  --  289  LABPROT  --   --  15.8* 15.2  INR  --   --  1.25 1.19  CREATININE 1.04 0.87  --  0.94    Estimated Creatinine Clearance: 59.5 mL/min (by C-G formula based on SCr of 0.94 mg/dL).   Medical History: Past Medical History:  Diagnosis Date  . Dementia   . High cholesterol   . Hypertension   . Myocardial infarction   . Stroke [facility])     Assessment: 76 yo male with hx of CVA who was previously on Xarelto was admitted with another acute CVA. He was not taking Xarelto prior to admission secondary to cost. He was restarted on Xarelto on 3/29 and is now being transitioned to warfarin. Last dose of Xarelto on 3/30 pm. INR 1.19 this am, warfarin dose that was ordered for 3/31 pm was not administered, CBC stable.   Goal of Therapy:  INR 2-3 Monitor platelets by anticoagulation protocol: Yes   Plan:  -Warfarin 5 mg po x1 -Daily INR  Pollyann Samples, PharmD, BCPS 04/23/2016, 11:17 AM

## 2016-04-23 NOTE — Progress Notes (Signed)
[facility] PROGRESS NOTE  Calvin Diaz ZOX:[id number] DOB: 26-Feb-1940 DOA: 04/20/2016   PCP: [facility]     Assessment/Plan: Active Problems:   History of CVA with residual deficit   Hyperlipidemia   Essential hypertension   Atrial fibrillation (HCC)   Dementia   CVA (cerebral vascular accident) (HCC)   Leukocytosis   Polyarticular arthritis   Stroke [facility])   Acute CVA (cerebrovascular accident) (HCC)   76 y.o. male with medical history significant of CVA from 11/18, afib, HTN, gouty arthritis, dementia who was admitted 11/17 with slurred speech, found to have a small area of acute ischemia superimposed on the area of encephalomalacia associated with remote right MCA infarct.Patient was seen by Neurology who recommended continued xarelto on discharge. Chart reviewed. On follow up with Neurology on 03/30/16, patient was found to be no longer taking xarelto secondary to cost. Patient returns today with complaints of worsened slurred speech starting 7-8pm on 3/28 with generalized weakness.. Patient admitted for stroke workup  Assessment and plan Acute CVA due to noncompliance with anticoagulation Small focus of acute infarction within the right superolateral parietal lobe at the margins of a prior chronic right MCA infarction.2. Multiple stable chronic infarcts of the supratentorial brain bilaterally. 2D Echo - 09/08/2015 - EF 60 - 65 %, no repeat echo done this admission LDL 54, hemoglobin A1c pending MRA - not performed. CTA H&N Nov 2017 Xarelto now changed over to Coumadin  plan is to initiate warfarin without a bridge per neurology. Coumadin dosing per pharmacy ok to continue concomitant plavix (discussed with Dr Pearlean Brownie) Neurology following PT/OT-SNF , Lelon Mast, thin liquids Continue statin SNF recommended,CIR consult requested and pending   Dyslipidemia: LDL 55-continue statin  Hypokalemia-repleted  Persistent Atrial fibrillation:  Currently in sinus rhythm-Not on any rate control medications, CHADSVASC score of 4. Was seen by cardiology as an outpatient on 11/15-planswere for DCCV-after 4 weeks of uninterrupted Xarelto therapy-followed by Dr.Jonathan Erlene Quan, MD, last seen 04/11/16. Noncompliant with Xarelto, switched over to Coumadin  Hypertension:Hold amlodipine and continue Terazosin, to allow for permissive hypertension  Dementia with mild delirium:Continue Namenda.  Chronic insomnia:Resume belsomra as outpatient  Patchy apical lung opacities:Seen on CT angiogram of the neck, no obvious infiltrates seen on chest x-ray. Radiology recommends to repeat CT in 6 months.   Peripheral vascular disease Doppler studies performed in our office 03/01/16 revealed a right ABI 0.95 and a left of 1.1 with three-vessel runoff bilaterally. He did have a moderate lesion in his distal right SFA.Marland KitchenHe denies claudication. He does have a palpable pedal pulse on the right, on Plavix  DVT prophylaxsis xarelto  Code Status:  DNR     Family Communication: Discussed in detail with the patient, all imaging results, lab results explained to the patient   Disposition Plan:     SNF Monday      Consultants:  Neurology  Procedures:  None  Antibiotics: Anti-infectives    None         HPI/Subjective: Patient not very communicative, sleeping,   Objective: Vitals:   04/22/16 1800 04/22/16 2103 04/23/16 0115 04/23/16 0554  BP: 121/80 128/75 129/62 111/66  Pulse: 80 78 78 85  Resp: Temp: 97.8 F (36.6 C) 97.8 F (36.6 C) 98.4 F (36.9 C) 97.8 F (36.6 C)  TempSrc: Oral Oral Axillary Oral  SpO2: 96% 98% 99% 100%  Weight:      Height:  Intake/Output Summary (Last 24 hours) at 04/23/16 0928 Last data filed at 04/23/16 0151  Gross per 24 hour  Intake                3 ml  Output              400 ml  Net             -397 ml    Exam:  Examination:  General exam: Appears calm and  comfortable  Respiratory system: Clear to auscultation. Respiratory effort normal. Cardiovascular system: S1 & S2 heard, RRR. No JVD, murmurs, rubs, gallops or clicks. No pedal edema. Gastrointestinal system: Abdomen is nondistended, soft and nontender. No organomegaly or masses felt. Normal bowel sounds heard. Central nervous system: Alert  , expressive aphasia  Extremities: Symmetric 5 x 5 power. Skin: No rashes, lesions or ulcers Psychiatry: Judgement imapired     Data Reviewed: I have personally reviewed following labs and imaging studies  Micro Results Recent Results (from the past 240 hour(s))  Blood Culture (routine x 2)     Status: None (Preliminary result)   Collection Time: 04/20/16  5:14 PM  Result Value Ref Range Status   Specimen Description BLOOD RIGHT ANTECUBITAL  Final   Special Requests BOTTLES DRAWN AEROBIC AND ANAEROBIC 5CC  Final   Culture NO GROWTH 2 DAYS  Final   Report Status PENDING  Incomplete  Blood Culture (routine x 2)     Status: None (Preliminary result)   Collection Time: 04/20/16  5:20 PM  Result Value Ref Range Status   Specimen Description BLOOD RIGHT FOREARM  Final   Special Requests BOTTLES DRAWN AEROBIC AND ANAEROBIC 5CC  Final   Culture NO GROWTH 2 DAYS  Final   Report Status PENDING  Incomplete    Radiology Reports Ct Head Wo Contrast  Result Date: 04/20/2016 CLINICAL DATA:  Slurred speech. EXAM: CT HEAD WITHOUT CONTRAST TECHNIQUE: Contiguous axial images were obtained from the base of the skull through the vertex without intravenous contrast. COMPARISON:  CT scan of February 06, 2016. FINDINGS: Brain: Stable encephalomalacia is seen involving the right MCA territory consistent with old infarction. Stable left frontal encephalomalacia is noted consistent with old infarction. No mass effect or midline shift is noted. Ventricular size is within normal limits. There is no evidence of mass lesion, hemorrhage or acute infarction. Vascular: No  hyperdense vessel or unexpected calcification. Skull: Normal. Negative for fracture or focal lesion. Sinuses/Orbits: No acute finding. Other: None. IMPRESSION: Old bilateral cerebral infarctions. No acute intracranial abnormality seen. Electronically Signed   By: Lupita Raider, M.D.   On: 04/20/2016 15:40   Mr Brain Wo Contrast  Result Date: 04/20/2016 CLINICAL DATA:  History of stroke with worsening slurred speech and generalized weakness. EXAM: MRI HEAD WITH AND WITHOUT CONTRAST TECHNIQUE: Multiplanar, multiecho pulse sequences of the brain and surrounding structures were obtained with and without intravenous contrast. CONTRAST:  14mL MULTIHANCE GADOBENATE DIMEGLUMINE 529 MG/ML IV SOLN COMPARISON:  12/14/2015 MRI of the head. 04/20/2016 and CT of the head. FINDINGS: Brain: Small focus of diffusion restriction within the right superolateral parietal lobe at the margins of an area of encephalomalacia from prior infarction compatible with interval acute infarct. There are multiple additional areas of encephalomalacia within the right occipital lobe, right posterior temporal lobe, bilateral frontal lobes, and right insula compatible with chronic infarction. The infarctions in the right posterior temporal, right parietal, and bilateral superolateral frontal lobes demonstrate susceptibility hypointensity compatible with hemosiderin deposition  from prior hemorrhage. This is most pronounced in the right superolateral frontal lobe (series 9, image 74) where the prominent susceptibility artifact gives the appearance of mass effect. The signal pattern is unchanged when compared with the prior MRI of the brain. There is no evidence of extra-axial collection, hydrocephalus, or effacement of basilar cisterns. After administration of intravenous contrast there is no abnormal enhancement of the brain. There is slight smooth pachymeningeal thickening which is likely the result of volume loss in the brain and altered flow  dynamics post multiple infarctions. Vascular: Normal flow voids. Skull and upper cervical spine: Normal marrow signal. Sinuses/Orbits: Mild maxillary sinus mucosal thickening. Otherwise negative. Other: None. IMPRESSION: 1. Small focus of acute infarction within the right superolateral parietal lobe at the margins of a prior chronic right MCA infarction. 2. Multiple stable chronic infarcts of the supratentorial brain bilaterally. Electronically Signed   By: Mitzi Hansen M.D.   On: 04/20/2016 22:41   Mr Brain W Contrast  Result Date: 04/20/2016 CLINICAL DATA:  History of stroke with worsening slurred speech and generalized weakness. EXAM: MRI HEAD WITH AND WITHOUT CONTRAST TECHNIQUE: Multiplanar, multiecho pulse sequences of the brain and surrounding structures were obtained with and without intravenous contrast. CONTRAST:  14mL MULTIHANCE GADOBENATE DIMEGLUMINE 529 MG/ML IV SOLN COMPARISON:  12/14/2015 MRI of the head. 04/20/2016 and CT of the head. FINDINGS: Brain: Small focus of diffusion restriction within the right superolateral parietal lobe at the margins of an area of encephalomalacia from prior infarction compatible with interval acute infarct. There are multiple additional areas of encephalomalacia within the right occipital lobe, right posterior temporal lobe, bilateral frontal lobes, and right insula compatible with chronic infarction. The infarctions in the right posterior temporal, right parietal, and bilateral superolateral frontal lobes demonstrate susceptibility hypointensity compatible with hemosiderin deposition from prior hemorrhage. This is most pronounced in the right superolateral frontal lobe (series 9, image 74) where the prominent susceptibility artifact gives the appearance of mass effect. The signal pattern is unchanged when compared with the prior MRI of the brain. There is no evidence of extra-axial collection, hydrocephalus, or effacement of basilar cisterns. After  administration of intravenous contrast there is no abnormal enhancement of the brain. There is slight smooth pachymeningeal thickening which is likely the result of volume loss in the brain and altered flow dynamics post multiple infarctions. Vascular: Normal flow voids. Skull and upper cervical spine: Normal marrow signal. Sinuses/Orbits: Mild maxillary sinus mucosal thickening. Otherwise negative. Other: None. IMPRESSION: 1. Small focus of acute infarction within the right superolateral parietal lobe at the margins of a prior chronic right MCA infarction. 2. Multiple stable chronic infarcts of the supratentorial brain bilaterally. Electronically Signed   By: Mitzi Hansen M.D.   On: 04/20/2016 22:41   Dg Chest Port 1 View  Result Date: 04/20/2016 CLINICAL DATA:  Initial evaluation for increase generalized weakness, slurred speech. EXAM: PORTABLE CHEST 1 VIEW COMPARISON:  Prior radiograph from 02/06/2016. FINDINGS: Cardiac and mediastinal silhouettes are stable in size and contour, and remain within normal limits. Aortic atherosclerosis noted. Lungs normally inflated. No focal infiltrates. No pulmonary edema. No definite pleural effusion, although the costophrenic angles are incompletely visualized. No pneumothorax. No acute osseous abnormality. Rounded opacity overlying the medial left hemidiaphragm suspected to reflect a a hernia. IMPRESSION: 1. No active cardiopulmonary disease. 2. Well-circumscribed rounded opacity overlying the medial left hemidiaphragm. Finding suspected to reflect a hernia. 3. Aortic atherosclerosis. Electronically Signed   By: Rise Mu M.D.   On: 04/20/2016 17:14  CBC  Recent Labs Lab 04/20/16 1436 04/21/16 0318 04/23/16 0400  WBC 10.7* 12.4* 7.6  HGB 11.9* 10.8* 10.7*  HCT 36.8* 33.3* 32.8*  PLT 233 259 289  MCV 90.4 89.8 88.9  MCH 29.2 29.1 29.0  MCHC 32.3 32.4 32.6  RDW 14.6 14.5 14.1    Chemistries   Recent Labs Lab 04/20/16 1436  04/21/16 0318 04/23/16 0400  NA 142 141 138  K 3.7 3.2* 3.7  CL 105 102 100*  CO2 GLUCOSE 124* 95 99  BUN CREATININE 1.04 0.87 0.94  CALCIUM 9.1 9.2 8.9  AST 28 30  --   ALT 11* 12*  --   ALKPHOS 50 46  --   BILITOT 0.5 0.8  --    ------------------------------------------------------------------------------------------------------------------ estimated creatinine clearance is 59.5 mL/min (by C-G formula based on SCr of 0.94 mg/dL). ------------------------------------------------------------------------------------------------------------------ No results for input(s): HGBA1C in the last 72 hours. ------------------------------------------------------------------------------------------------------------------  Recent Labs  04/23/16 0400  CHOL 104  HDL 39*  LDLCALC 54  TRIG 55  CHOLHDL 2.7   ------------------------------------------------------------------------------------------------------------------ No results for input(s): TSH, T4TOTAL, T3FREE, THYROIDAB in the last 72 hours.  Invalid input(s): FREET3 ------------------------------------------------------------------------------------------------------------------ No results for input(s): VITAMINB12, FOLATE, FERRITIN, TIBC, IRON, RETICCTPCT in the last 72 hours.  Coagulation profile  Recent Labs Lab 04/22/16 1844 04/23/16 0400  INR 1.25 1.19    No results for input(s): DDIMER in the last 72 hours.  Cardiac Enzymes No results for input(s): CKMB, TROPONINI, MYOGLOBIN in the last 168 hours.  Invalid input(s): CK ------------------------------------------------------------------------------------------------------------------ Invalid input(s): POCBNP   CBG: No results for input(s): GLUCAP in the last 168 hours.     Studies: No results found.    Lab Results  Component Value Date   HGBA1C 6.2 (H) 12/16/2015   Lab Results  Component Value Date   LDLCALC 54 04/23/2016    CREATININE 0.94 04/23/2016       Scheduled Meds: . amLODipine  5 mg Oral Daily  . citalopram  20 mg Oral Daily  . clopidogrel  75 mg Oral Daily  . colchicine  0.6 mg Oral Daily  . divalproex  500 mg Oral BID  . memantine  10 mg Oral Daily  . potassium chloride  20 mEq Oral BID  . pravastatin  80 mg Oral QHS  . sodium chloride flush  3 mL Intravenous Q12H  . terazosin  1 mg Oral QHS  . warfarin  5 mg Oral ONCE-1800  . Warfarin - Pharmacist Dosing Inpatient   Does not apply q1800   Continuous Infusions: . sodium chloride    . dextrose 5 % and 0.9% NaCl       LOS: 2 days    Time spent: >30 MINS    [facility]  [facility] Pager [phone]. If 7PM-7AM, please contact night-coverage at [email or web address], password [facility] 04/23/2016, 9:28 AM  LOS: 2 days

## 2016-04-23 NOTE — Progress Notes (Signed)
STROKE TEAM PROGRESS NOTE   HISTORY OF PRESENT ILLNESS (per record) Calvin Diaz is an 76 y.o. male with medical history significant of CVA from Nov 2017, afib, HTN, gouty arthritis, dementia who was admitted 11/17 with slurred speech, found to have a small area of acute ischemia superimposed on the area of encephalomalacia associated with remote right MCA infarct. Patient was seen by Neurology who recommended continued xarelto on discharge. Chart reviewed. On follow up with Neurology on 03/30/16, patient was found to be no longer taking xarelto secondary to cost. Patient returns today with complaints of worsened slurred speech starting 7-8pm on 3/28.  MRI was obtained and did show small focus of acute infarct within the right superolateral parietal lobe at the margins of the prior chronic right MCA infarct.   Patient is a poor historian secondary to his underlying dementia.  Date last known well: Date: 04/19/2016 Time last known well: Time: 19:00 tPA Given: No: recent stroke   SUBJECTIVE (INTERVAL HISTORY) His family is not at the bedside.   No complaints   OBJECTIVE Temp:  [97.8 F (36.6 C)-98.4 F (36.9 C)] 97.8 F (36.6 C) (04/01 0554) Pulse Rate:  [78-85] 85 (04/01 0554) Cardiac Rhythm: Normal sinus rhythm (04/01 0700) Resp:  [14-18] 18 (04/01 0554) BP: (106-129)/(62-80) 111/66 (04/01 0554) SpO2:  [96 %-100 %] 100 % (04/01 0554)  CBC:   Recent Labs Lab 04/21/16 0318 04/23/16 0400  WBC 12.4* 7.6  HGB 10.8* 10.7*  HCT 33.3* 32.8*  MCV 89.8 88.9  PLT 259 289    Basic Metabolic Panel:   Recent Labs Lab 04/21/16 0318 04/23/16 0400  NA 141 138  K 3.2* 3.7  CL 102 100*  CO2 29 29  GLUCOSE 95 99  BUN 11 15  CREATININE 0.87 0.94  CALCIUM 9.2 8.9    Lipid Panel:     Component Value Date/Time   CHOL 104 04/23/2016 0400   TRIG 55 04/23/2016 0400   HDL 39 (L) 04/23/2016 0400   CHOLHDL 2.7 04/23/2016 0400   VLDL 11 04/23/2016 0400   LDLCALC 54 04/23/2016 0400    HgbA1c:  Lab Results  Component Value Date   HGBA1C 6.2 (H) 12/16/2015   Urine Drug Screen:     Component Value Date/Time   LABOPIA NONE DETECTED 12/15/2015 1535   COCAINSCRNUR NONE DETECTED 12/15/2015 1535   LABBENZ NONE DETECTED 12/15/2015 1535   AMPHETMU NONE DETECTED 12/15/2015 1535   THCU NONE DETECTED 12/15/2015 1535   LABBARB NONE DETECTED 12/15/2015 1535      IMAGING  Ct Head Wo Contrast 04/20/2016 Old bilateral cerebral infarctions. No acute intracranial abnormality seen.    Mr Brain Wo Contrast 04/20/2016 1. Small focus of acute infarction within the right superolateral parietal lobe at the margins of a prior chronic right MCA infarction.  2. Multiple stable chronic infarcts of the supratentorial brain bilaterally.    Mr Brain With Contrast 04/20/2016 1. Small focus of acute infarction within the right superolateral parietal lobe at the margins of a prior chronic right MCA infarction.  2. Multiple stable chronic infarcts of the supratentorial brain bilaterally.    Dg Chest Port 1 View 04/20/2016 1. No active cardiopulmonary disease.  2. Well-circumscribed rounded opacity overlying the medial left hemidiaphragm. Finding suspected to reflect a hernia.  3. Aortic atherosclerosis.   CT Angio Head and Neck 12/15/2015 1. No acute arterial finding. No embolic source or flow limiting stenosis in the right anterior circulation to explain acute infarcts. 2. Moderate left V1  segment stenosis. 3. Advanced chronic ischemic injury which obscures the acute right MCA territory infarct by CT. No hemorrhagic conversion or evolution since brain MRI yesterday. 4. Patchy apical lung opacities that are nonspecific. Some contain central calcifications on the right, favoring remote infection. Recommend chest x-ray correlation to ensure no acute opacities compared to recent priors. If clinically appropriate, chest CT in 6 months could follow-up the small nodular areas.    Chest  X-ray 2 View  02/06/2016 Cardiac silhouette is normal in size. No mediastinal or hilar masses. No evidence of adenopathy. The lungs are hyperexpanded but clear. No pleural effusion. No pneumothorax. Skeletal structures are demineralized but grossly intact. Impression No acute cardiopulmonary disease.    PHYSICAL EXAM Frail elderly male not in distress. . Afebrile. Head is nontraumatic. Neck is supple without bruit.    Cardiac exam no murmur or gallop. Lungs are clear to auscultation. Distal pulses are well felt.  Neurological Exam :  Awake alert oriented x 3 normal speech and language. Mild left lower face asymmetry. Tongue midline. Spastic left hemiparesis with mild left upper extremity   drift. Left upper extremity 3/5 strength with significant weakness of the grip and hand. Left lower extremity 4/5 strength. Tone is increased on the left side. Normal sensation . Impaired left sided coordination. Gait was deferred   ASSESSMENT/PLAN [name] is a 76 y.o. male with history of a previous stroke, atrial fibrillation (non compliant with Xarelto 2/2 cost), coronary artery disease with MI, gout, hypertension, hyperlipidemia, and dementia presenting with slurred speech. He did not receive IV t-PA due to recent stroke.  Stroke:  Small focus of acute infarction within the right superolateral parietal lobe - embolic - afib. Noncompliant with medication  Resultant  increase gait difficulty  MRI - Small focus of acute infarction within the right superolateral parietal lobe. Multiple old infarcts.  MRA - not performed. CTA H&N Nov 2017.  Carotid Doppler - CTA neck 12/15/2015  2D Echo - 09/08/2015 - EF 60 - 65 %  LDL - 55 (12/16/15) - recheck  HgbA1c - 6.2 (12/16/15) - recheck  VTE prophylaxis - Xarelto Diet Heart Room service appropriate? Yes; Fluid consistency: Thin  clopidogrel 75 mg daily prior to admission, now on clopidogrel 75 mg daily and Xarelto (rivaroxaban)  daily  Patient counseled to be compliant with his antithrombotic medications  Ongoing aggressive stroke risk factor management  Therapy recommendations:  SNF recommended.  Disposition: Pending  Hypertension  BP tends to run low on Norvasc 5 mg daily. May not need Norvasc or consider 2.5 mg daily.  Permissive hypertension (OK if < 220/120) but gradually normalize in 5-7 days  Long-term BP goal normotensive  Hyperlipidemia  Home meds:  Pravachol 80 mg daily resumed in hospital  LDL 55, goal < 70  Continue statin at discharge  Diabetes  HgbA1c 6.2, goal < 7.0  Controlled  Other Stroke Risk Factors  Advanced age  Former cigarette smoker - quit  Hx stroke/TIA  Family hx stroke (mother)  Coronary artery disease  Gout  Afib  Other Active Problems  Hypokalemia - 3.2 - supplement and recheck  Anemia - 10.8 / 33.3  Leukocytosis - 12.4 (afebrile)  Mild hypotension  Hospital day # 2   Continue warfarin instead which he should be able to afford. Continue Plavix bridging until INR is optimal. Follow-up as an outpatient in stroke clinic in 6 weeks Stroke team will sign off. Kindly call for questions.  Delia Heady, MD  To  contact Stroke Continuity provider, please refer to [email or web address] After hours, contact General Neurology

## 2016-04-24 LAB — HEMOGLOBIN A1C
HEMOGLOBIN A1C: 6 % — AB (ref 4.8–5.6)
Mean Plasma Glucose: 126 mg/dL

## 2016-04-24 LAB — BASIC METABOLIC PANEL
Anion gap: 9 (ref 5–15)
BUN: 13 mg/dL (ref 6–20)
CHLORIDE: 100 mmol/L — AB (ref 101–111)
CO2: 27 mmol/L (ref 22–32)
Calcium: 9.1 mg/dL (ref 8.9–10.3)
Creatinine, Ser: 0.88 mg/dL (ref 0.61–1.24)
GFR calc Af Amer: >60 mL/min (ref >60)
GFR calc non Af Amer: >60 mL/min (ref >60)
Glucose, Bld: 94 mg/dL (ref 65–99)
POTASSIUM: 3.7 mmol/L (ref 3.5–5.1)
SODIUM: 136 mmol/L (ref 135–145)

## 2016-04-24 LAB — GLUCOSE, CAPILLARY: GLUCOSE-CAPILLARY: 85 mg/dL (ref 65–99)

## 2016-04-24 LAB — PROTIME-INR
INR: 1.15
Prothrombin Time: 14.8 seconds (ref 11.4–15.2)

## 2016-04-24 MED ORDER — WARFARIN SODIUM 5 MG PO TABS
5.0000 mg | ORAL_TABLET | Freq: Once | ORAL | Status: AC
  Administered 2016-04-24: 5 mg via ORAL
  Filled 2016-04-24: qty 1

## 2016-04-24 MED ORDER — WARFARIN SODIUM 5 MG PO TABS: 5.0000 mg | ORAL_TABLET | Freq: Every day | ORAL | 0 refills | Status: DC

## 2016-04-24 NOTE — Plan of Care (Signed)
Problem: Education: Goal: Knowledge of [facility] Education information/materials will improve Outcome: Progressing Family knowledge progressing   Problem: Skin Integrity: Goal: Risk for impaired skin integrity will decrease Outcome: Progressing Wound nurse assessed pt today; mepilex to bilateral heels

## 2016-04-24 NOTE — Progress Notes (Signed)
[facility] PROGRESS NOTE  Calvin Diaz ZOX:[id number] DOB: 1940/10/06 DOA: 04/20/2016   PCP: [facility]     Assessment/Plan: Active Problems:   History of CVA with residual deficit   Hyperlipidemia   Essential hypertension   Atrial fibrillation (HCC)   Dementia   CVA (cerebral vascular accident) (HCC)   Leukocytosis   Polyarticular arthritis   Stroke [facility])   Acute CVA (cerebrovascular accident) (HCC)   76 y.o. male with medical history significant of CVA from 11/18, afib, HTN, gouty arthritis, dementia who was admitted 11/17 with slurred speech, found to have a small area of acute ischemia superimposed on the area of encephalomalacia associated with remote right MCA infarct.Patient was seen by Neurology who recommended continued xarelto on discharge. Chart reviewed. On follow up with Neurology on 03/30/16, patient was found to be no longer taking xarelto secondary to cost. Patient returns today with complaints of worsened slurred speech starting 7-8pm on 3/28 with generalized weakness.. Patient admitted for stroke workup  Assessment and plan Acute CVA due to noncompliance with anticoagulation Small focus of acute infarction within the right superolateral parietal lobe at the margins of a prior chronic right MCA infarction.2. Multiple stable chronic infarcts of the supratentorial brain bilaterally. 2D Echo - 09/08/2015 - EF 60 - 65 %, no repeat echo done this admission LDL 54, hemoglobin A1c  6.0 MRA - not performed. CTA H&N Nov 2017 Xarelto now changed over to Coumadin  plan is to initiate warfarin without a bridge per neurology. Coumadin dosing per pharmacy ok to continue concomitant plavix (discussed with Dr Pearlean Brownie) Neurology following PT/OT-SNF , Lelon Mast, thin liquids Continue statin SNF  , Erick Colace, MD feels that the patient is appropriate for SNF   Dyslipidemia: LDL 55-continue  statin  Hypokalemia-repleted  Persistent Atrial fibrillation: Currently in sinus rhythm-Not on any rate control medications, CHADSVASC score of 4. Was seen by cardiology as an outpatient on 11/15-planswere for DCCV-after 4 weeks of uninterrupted Xarelto therapy-followed by Dr.Jonathan Erlene Quan, MD, last seen 04/11/16. Noncompliant with Xarelto, switched over to Coumadin  Hypertension:Hold amlodipine and continue Terazosin, to allow for permissive hypertension  Dementia with mild delirium:Continue Namenda.  Chronic insomnia:Resume belsomra as outpatient  Patchy apical lung opacities:Seen on CT angiogram of the neck, no obvious infiltrates seen on chest x-ray. Radiology recommends to repeat CT in 6 months.   Peripheral vascular disease Doppler studies performed in our office 03/01/16 revealed a right ABI 0.95 and a left of 1.1 with three-vessel runoff bilaterally. He did have a moderate lesion in his distal right SFA.Marland KitchenHe denies claudication. He does have a palpable pedal pulse on the right, on Plavix  DVT prophylaxsis xarelto  Code Status:  DNR     Family Communication: Discussed in detail with the patient, all imaging results, lab results explained to the patient   Disposition Plan:    Awaiting SNF      Consultants:  Neurology  Procedures:  None  Antibiotics: Anti-infectives    None         HPI/Subjective: Hemodynamically stable but somnolent   Objective: Vitals:   04/23/16 2138 04/24/16 0130 04/24/16 0520 04/24/16 0951  BP: 110/66 (!) 113/56 136/73 102/60  Pulse: 82 85 (!) 56 80  Resp: Temp: 98.5 F (36.9 C) 97.8 F (36.6 C) 98.2 F (36.8 C) 98.1 F (36.7 C)  TempSrc: Oral Oral Axillary Oral  SpO2: 99% 100% 99% 97%  Weight:  Height:        Intake/Output Summary (Last 24 hours) at 04/24/16 1338 Last data filed at 04/24/16 0526  Gross per 24 hour  Intake                3 ml  Output              400 ml  Net             -397 ml     Exam:  Examination:  General exam: Appears calm and comfortable  Respiratory system: Clear to auscultation. Respiratory effort normal. Cardiovascular system: S1 & S2 heard, RRR. No JVD, murmurs, rubs, gallops or clicks. No pedal edema. Gastrointestinal system: Abdomen is nondistended, soft and nontender. No organomegaly or masses felt. Normal bowel sounds heard. Central nervous system: Alert  , expressive aphasia  Extremities: Symmetric 5 x 5 power. Skin: No rashes, lesions or ulcers Psychiatry: Judgement imapired     Data Reviewed: I have personally reviewed following labs and imaging studies  Micro Results Recent Results (from the past 240 hour(s))  Blood Culture (routine x 2)     Status: None (Preliminary result)   Collection Time: 04/20/16  5:14 PM  Result Value Ref Range Status   Specimen Description BLOOD RIGHT ANTECUBITAL  Final   Special Requests BOTTLES DRAWN AEROBIC AND ANAEROBIC 5CC  Final   Culture NO GROWTH 4 DAYS  Final   Report Status PENDING  Incomplete  Blood Culture (routine x 2)     Status: None (Preliminary result)   Collection Time: 04/20/16  5:20 PM  Result Value Ref Range Status   Specimen Description BLOOD RIGHT FOREARM  Final   Special Requests BOTTLES DRAWN AEROBIC AND ANAEROBIC 5CC  Final   Culture NO GROWTH 4 DAYS  Final   Report Status PENDING  Incomplete    Radiology Reports Ct Head Wo Contrast  Result Date: 04/20/2016 CLINICAL DATA:  Slurred speech. EXAM: CT HEAD WITHOUT CONTRAST TECHNIQUE: Contiguous axial images were obtained from the base of the skull through the vertex without intravenous contrast. COMPARISON:  CT scan of February 06, 2016. FINDINGS: Brain: Stable encephalomalacia is seen involving the right MCA territory consistent with old infarction. Stable left frontal encephalomalacia is noted consistent with old infarction. No mass effect or midline shift is noted. Ventricular size is within normal limits. There is no evidence of  mass lesion, hemorrhage or acute infarction. Vascular: No hyperdense vessel or unexpected calcification. Skull: Normal. Negative for fracture or focal lesion. Sinuses/Orbits: No acute finding. Other: None. IMPRESSION: Old bilateral cerebral infarctions. No acute intracranial abnormality seen. Electronically Signed   By: Lupita Raider, M.D.   On: 04/20/2016 15:40   Mr Brain Wo Contrast  Result Date: 04/20/2016 CLINICAL DATA:  History of stroke with worsening slurred speech and generalized weakness. EXAM: MRI HEAD WITH AND WITHOUT CONTRAST TECHNIQUE: Multiplanar, multiecho pulse sequences of the brain and surrounding structures were obtained with and without intravenous contrast. CONTRAST:  14mL MULTIHANCE GADOBENATE DIMEGLUMINE 529 MG/ML IV SOLN COMPARISON:  12/14/2015 MRI of the head. 04/20/2016 and CT of the head. FINDINGS: Brain: Small focus of diffusion restriction within the right superolateral parietal lobe at the margins of an area of encephalomalacia from prior infarction compatible with interval acute infarct. There are multiple additional areas of encephalomalacia within the right occipital lobe, right posterior temporal lobe, bilateral frontal lobes, and right insula compatible with chronic infarction. The infarctions in the right posterior temporal, right parietal, and bilateral superolateral frontal  lobes demonstrate susceptibility hypointensity compatible with hemosiderin deposition from prior hemorrhage. This is most pronounced in the right superolateral frontal lobe (series 9, image 74) where the prominent susceptibility artifact gives the appearance of mass effect. The signal pattern is unchanged when compared with the prior MRI of the brain. There is no evidence of extra-axial collection, hydrocephalus, or effacement of basilar cisterns. After administration of intravenous contrast there is no abnormal enhancement of the brain. There is slight smooth pachymeningeal thickening which is likely  the result of volume loss in the brain and altered flow dynamics post multiple infarctions. Vascular: Normal flow voids. Skull and upper cervical spine: Normal marrow signal. Sinuses/Orbits: Mild maxillary sinus mucosal thickening. Otherwise negative. Other: None. IMPRESSION: 1. Small focus of acute infarction within the right superolateral parietal lobe at the margins of a prior chronic right MCA infarction. 2. Multiple stable chronic infarcts of the supratentorial brain bilaterally. Electronically Signed   By: Mitzi Hansen M.D.   On: 04/20/2016 22:41   Mr Brain W Contrast  Result Date: 04/20/2016 CLINICAL DATA:  History of stroke with worsening slurred speech and generalized weakness. EXAM: MRI HEAD WITH AND WITHOUT CONTRAST TECHNIQUE: Multiplanar, multiecho pulse sequences of the brain and surrounding structures were obtained with and without intravenous contrast. CONTRAST:  14mL MULTIHANCE GADOBENATE DIMEGLUMINE 529 MG/ML IV SOLN COMPARISON:  12/14/2015 MRI of the head. 04/20/2016 and CT of the head. FINDINGS: Brain: Small focus of diffusion restriction within the right superolateral parietal lobe at the margins of an area of encephalomalacia from prior infarction compatible with interval acute infarct. There are multiple additional areas of encephalomalacia within the right occipital lobe, right posterior temporal lobe, bilateral frontal lobes, and right insula compatible with chronic infarction. The infarctions in the right posterior temporal, right parietal, and bilateral superolateral frontal lobes demonstrate susceptibility hypointensity compatible with hemosiderin deposition from prior hemorrhage. This is most pronounced in the right superolateral frontal lobe (series 9, image 74) where the prominent susceptibility artifact gives the appearance of mass effect. The signal pattern is unchanged when compared with the prior MRI of the brain. There is no evidence of extra-axial collection,  hydrocephalus, or effacement of basilar cisterns. After administration of intravenous contrast there is no abnormal enhancement of the brain. There is slight smooth pachymeningeal thickening which is likely the result of volume loss in the brain and altered flow dynamics post multiple infarctions. Vascular: Normal flow voids. Skull and upper cervical spine: Normal marrow signal. Sinuses/Orbits: Mild maxillary sinus mucosal thickening. Otherwise negative. Other: None. IMPRESSION: 1. Small focus of acute infarction within the right superolateral parietal lobe at the margins of a prior chronic right MCA infarction. 2. Multiple stable chronic infarcts of the supratentorial brain bilaterally. Electronically Signed   By: Mitzi Hansen M.D.   On: 04/20/2016 22:41   Dg Chest Port 1 View  Result Date: 04/20/2016 CLINICAL DATA:  Initial evaluation for increase generalized weakness, slurred speech. EXAM: PORTABLE CHEST 1 VIEW COMPARISON:  Prior radiograph from 02/06/2016. FINDINGS: Cardiac and mediastinal silhouettes are stable in size and contour, and remain within normal limits. Aortic atherosclerosis noted. Lungs normally inflated. No focal infiltrates. No pulmonary edema. No definite pleural effusion, although the costophrenic angles are incompletely visualized. No pneumothorax. No acute osseous abnormality. Rounded opacity overlying the medial left hemidiaphragm suspected to reflect a a hernia. IMPRESSION: 1. No active cardiopulmonary disease. 2. Well-circumscribed rounded opacity overlying the medial left hemidiaphragm. Finding suspected to reflect a hernia. 3. Aortic atherosclerosis. Electronically Signed   By: Sharlet Salina  Phill Myron M.D.   On: 04/20/2016 17:14     CBC  Recent Labs Lab 04/20/16 1436 04/21/16 0318 04/23/16 0400  WBC 10.7* 12.4* 7.6  HGB 11.9* 10.8* 10.7*  HCT 36.8* 33.3* 32.8*  PLT 233 259 289  MCV 90.4 89.8 88.9  MCH 29.2 29.1 29.0  MCHC 32.3 32.4 32.6  RDW 14.6 14.5 14.1     Chemistries   Recent Labs Lab 04/20/16 1436 04/21/16 0318 04/23/16 0400 04/24/16 0632  NA 142 141 138 136  K 3.7 3.2* 3.7 3.7  CL 105 102 100* 100*  CO2 GLUCOSE 124* 95 99 94  BUN CREATININE 1.04 0.87 0.94 0.88  CALCIUM 9.1 9.2 8.9 9.1  AST 28 30  --   --   ALT 11* 12*  --   --   ALKPHOS 50 46  --   --   BILITOT 0.5 0.8  --   --    ------------------------------------------------------------------------------------------------------------------ estimated creatinine clearance is 63.6 mL/min (by C-G formula based on SCr of 0.88 mg/dL). ------------------------------------------------------------------------------------------------------------------  Recent Labs  04/23/16 0400  HGBA1C 6.0*   ------------------------------------------------------------------------------------------------------------------  Recent Labs  04/23/16 0400  CHOL 104  HDL 39*  LDLCALC 54  TRIG 55  CHOLHDL 2.7   ------------------------------------------------------------------------------------------------------------------ No results for input(s): TSH, T4TOTAL, T3FREE, THYROIDAB in the last 72 hours.  Invalid input(s): FREET3 ------------------------------------------------------------------------------------------------------------------ No results for input(s): VITAMINB12, FOLATE, FERRITIN, TIBC, IRON, RETICCTPCT in the last 72 hours.  Coagulation profile  Recent Labs Lab 04/22/16 1844 04/23/16 0400 04/24/16 0632  INR 1.25 1.19 1.15    No results for input(s): DDIMER in the last 72 hours.  Cardiac Enzymes No results for input(s): CKMB, TROPONINI, MYOGLOBIN in the last 168 hours.  Invalid input(s): CK ------------------------------------------------------------------------------------------------------------------ Invalid input(s): POCBNP   CBG:  Recent Labs Lab 04/24/16 1144  GLUCAP 85       Studies: No results found.    Lab  Results  Component Value Date   HGBA1C 6.0 (H) 04/23/2016   HGBA1C 6.2 (H) 12/16/2015   Lab Results  Component Value Date   LDLCALC 54 04/23/2016   CREATININE 0.88 04/24/2016       Scheduled Meds: . citalopram  20 mg Oral Daily  . clopidogrel  75 mg Oral Daily  . colchicine  0.6 mg Oral Daily  . divalproex  500 mg Oral BID  . memantine  10 mg Oral Daily  . pravastatin  80 mg Oral QHS  . sodium chloride flush  3 mL Intravenous Q12H  . terazosin  1 mg Oral QHS  . warfarin  5 mg Oral ONCE-1800  . Warfarin - Pharmacist Dosing Inpatient   Does not apply q1800   Continuous Infusions: . sodium chloride       LOS: 3 days    Time spent: >30 MINS    [facility]  [facility] Pager [phone]. If 7PM-7AM, please contact night-coverage at [email or web address], password [facility] 04/24/2016, 1:38 PM  LOS: 3 days

## 2016-04-24 NOTE — Progress Notes (Signed)
ANTICOAGULATION CONSULT NOTE   Pharmacy Consult for warfarin Indication: atrial fibrillation  No Known Allergies  Patient Measurements: Height:  (180.3 cm) Weight: 136 lb 11.2 oz (62 kg) IBW/kg (Calculated) : 75.3  Vital Signs: Temp: 98.1 F (36.7 C) (04/02 0951) Temp Source: Oral (04/02 0951) BP: 102/60 (04/02 0951) Pulse Rate: 80 (04/02 0951)  Labs:  Recent Labs  04/22/16 1844 04/23/16 0400 04/24/16 0632  HGB  --  10.7*  --   HCT  --  32.8*  --   PLT  --  289  --   LABPROT 15.8* 15.2 14.8  INR 1.25 1.19 1.15  CREATININE  --  0.94 0.88    Estimated Creatinine Clearance: 63.6 mL/min (by C-G formula based on SCr of 0.88 mg/dL).   Medical History: Past Medical History:  Diagnosis Date  . Dementia   . High cholesterol   . Hypertension   . Myocardial infarction   . Stroke [facility])     Assessment: 76 yo male with hx of CVA who was previously on Xarelto was admitted with another acute CVA. He was not taking Xarelto prior to admission secondary to cost. He was restarted on Xarelto on 3/29 and is now being transitioned to warfarin. Last dose of Xarelto on 3/30 pm.   INR = 1.15 today after one dose of warfarin 5 mg   Goal of Therapy:  INR 2-3 Monitor platelets by anticoagulation protocol: Yes   Plan:  -Repeat Warfarin 5 mg po x1 -Daily INR  Thank you Okey Regal, PharmD [phone] 04/24/2016, 11:11 AM

## 2016-04-24 NOTE — Consult Note (Addendum)
WOC Nurse wound consult note Reason for Consult: Consult requested for BLE.   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  There are no open wounds, odor, drainage, or fluctuance. Pressure Injury POA: Yes Measurement: Left outer heel .8X.8cm dry intact eschar Right outer heel 1X1cm dry intact eschar Dressing procedure/placement/frequency: It is best practice to leave dry stable eschar in place.  Float to reduce pressure and foam dressings to protect from further injury.  No family members present to discuss plan of care. Please re-consult if further assistance is needed.  Thank-you,  Cammie Mcgee MSN, RN, CWOCN, [place], CNS [phone]

## 2016-04-24 NOTE — Progress Notes (Signed)
qPhysical Therapy Treatment Patient Details Name: Calvin Diaz MRN: [id number] DOB: 1940/08/06 Today's Date: 04/24/2016    History of Present Illness Calvin Diaz is a 76 y.o. 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 Patient was seen by Neurology who recommended continued xarelto on discharge. Chart reviewed. On follow up with Neurology on 03/30/16, patient was found to be no longer taking xarelto secondary to cost. Patient returned to ED on 04/20/16 with complaints of worsened slurred speech starting 7-8pm on 3/28 with generalized weakness. MRI revealed small focus of acute infarction within the right superolateral parietal lobe at the margins of a prior chronic right MCA Infarction. Multiple stable chronic infarcts of the supratentorial brain bilaterally.    PT Comments    Pt progressing towards physical therapy goals. Was able to demonstrate increased independence with transfers and achieved ~10 feet ambulation this session. Continues to require +2 assist for balance support and safety. Worked with LUE to increase ROM and decrease tone, and noted an improvement in L shoulder AROM at end of session. Will continue to follow and progress as able per POC.    Follow Up Recommendations  SNF;Supervision/Assistance - 24 hour     Equipment Recommendations  None recommended by PT    Recommendations for Other Services       Precautions / Restrictions Precautions Precautions: Fall Restrictions Weight Bearing Restrictions: No    Mobility  Bed Mobility Overal bed mobility: Needs Assistance Bed Mobility: Supine to Sit     Supine to sit: Mod assist;+2 for physical assistance     General bed mobility comments: HOB elevated for ease of transfer. Pt able to initiate  transition to EOB but required assist to complete. Bed pad utilized for full scoot around to get feet on floor.   Transfers Overall transfer level: Needs assistance Equipment used: 2 person hand held assist Transfers: Sit to/from Stand Sit to Stand: Mod assist;+2 physical assistance         General transfer comment: Pt able to hold to therapist's arm on the R for support, and HHA was utilized on weaker LUE. Initially posterior lean noted but was able to somewhat correct with time.   Ambulation/Gait Ambulation/Gait assistance: Mod assist;+2 physical assistance Ambulation Distance (Feet): 10 Feet (5'+5') Assistive device: 2 person hand held assist Gait Pattern/deviations: Shuffle;Trunk flexed;Narrow base of support Gait velocity: Decreased Gait velocity interpretation: Below normal speed for age/gender General Gait Details: Pt unable to initiate ambulation with specific cues to do so, however almost automatic response to "Let's walk" - pt ambulated ~5', turned around and then ambulated back to the chair. Assist with weight shifting required to help initiate advancement of LE's.     Stairs            Wheelchair Mobility    Modified Rankin (Stroke Patients Only) Modified Rankin (Stroke Patients Only) Pre-Morbid Rankin Score: Moderate disability Modified Rankin: Moderately severe disability     Balance Overall balance assessment: Needs assistance Sitting-balance support: Feet supported;No upper extremity supported Sitting balance-Leahy Scale: Fair     Standing balance support: Bilateral upper extremity supported Standing balance-Leahy Scale: Zero Standing balance comment: +2 assist required.  Cognition Arousal/Alertness: Awake/alert Behavior During Therapy: WFL for tasks assessed/performed Overall Cognitive Status: No family/caregiver present to determine baseline cognitive functioning Area of Impairment: Following  commands;Memory;Safety/judgement;Awareness;Attention;Orientation                 Orientation Level: Time;Disoriented to;Situation Current Attention Level: Focused Memory: Decreased short-term memory Following Commands: Follows one step commands with increased time;Follows one step commands inconsistently Safety/Judgement: Decreased awareness of deficits;Decreased awareness of safety Awareness: Intellectual          Exercises General Exercises - Upper Extremity Shoulder Flexion: 10 reps;Left;PROM Shoulder ABduction: 10 reps;Left;PROM Other Exercises Other Exercises: Grip strengthening on L x5 Other Exercises: Active reaching activity x3    General Comments        Pertinent Vitals/Pain Pain Assessment: Faces Faces Pain Scale: Hurts little more Pain Location: B heels, LUE Pain Descriptors / Indicators: Discomfort;Grimacing Pain Intervention(s): Limited activity within patient's tolerance;Monitored during session;Repositioned    Home Living                      Prior Function            PT Goals (current goals can now be found in the care plan section) Acute Rehab PT Goals Patient Stated Goal: Pt did not state goals during session PT Goal Formulation: With patient Time For Goal Achievement: 04/28/16 Potential to Achieve Goals: Fair Progress towards PT goals: Progressing toward goals    Frequency    Min 3X/week      PT Plan Current plan remains appropriate    Co-evaluation             End of Session Equipment Utilized During Treatment: Gait belt Activity Tolerance: Patient tolerated treatment well Patient left: in chair;with call bell/phone within reach;with chair alarm set Nurse Communication: Mobility status PT Visit Diagnosis: Unsteadiness on feet (R26.81);Hemiplegia and hemiparesis Hemiplegia - Right/Left: Left Hemiplegia - dominant/non-dominant: Non-dominant Hemiplegia - caused by: Cerebral infarction     Time: 7829-5621 PT  Time Calculation (min) (ACUTE ONLY): 44 min  Charges:  $Gait Training: 23-37 mins $Therapeutic Exercise: 8-22 mins                    G Codes:       [name], PT, DPT Acute Rehabilitation Services Pager: [phone]    [name] 04/24/2016, 11:16 AM

## 2016-04-24 NOTE — Progress Notes (Signed)
Pt converted to Afib around 0415 HR stayed at or below 100 he was asymptomatic, will continue to monitor.

## 2016-04-25 DIAGNOSIS — M6281 Muscle weakness (generalized): Secondary | ICD-10-CM | POA: Diagnosis not present

## 2016-04-25 DIAGNOSIS — E785 Hyperlipidemia, unspecified: Secondary | ICD-10-CM | POA: Diagnosis not present

## 2016-04-25 DIAGNOSIS — D72829 Elevated white blood cell count, unspecified: Secondary | ICD-10-CM | POA: Diagnosis not present

## 2016-04-25 DIAGNOSIS — R262 Difficulty in walking, not elsewhere classified: Secondary | ICD-10-CM | POA: Diagnosis not present

## 2016-04-25 DIAGNOSIS — Z8673 Personal history of transient ischemic attack (TIA), and cerebral infarction without residual deficits: Secondary | ICD-10-CM | POA: Diagnosis not present

## 2016-04-25 DIAGNOSIS — R531 Weakness: Secondary | ICD-10-CM | POA: Diagnosis not present

## 2016-04-25 DIAGNOSIS — I10 Essential (primary) hypertension: Secondary | ICD-10-CM | POA: Diagnosis not present

## 2016-04-25 DIAGNOSIS — N3281 Overactive bladder: Secondary | ICD-10-CM | POA: Diagnosis not present

## 2016-04-25 DIAGNOSIS — M1380 Other specified arthritis, unspecified site: Secondary | ICD-10-CM | POA: Diagnosis not present

## 2016-04-25 DIAGNOSIS — M109 Gout, unspecified: Secondary | ICD-10-CM | POA: Diagnosis not present

## 2016-04-25 DIAGNOSIS — I481 Persistent atrial fibrillation: Secondary | ICD-10-CM | POA: Diagnosis not present

## 2016-04-25 DIAGNOSIS — I639 Cerebral infarction, unspecified: Secondary | ICD-10-CM | POA: Diagnosis not present

## 2016-04-25 DIAGNOSIS — R918 Other nonspecific abnormal finding of lung field: Secondary | ICD-10-CM | POA: Diagnosis not present

## 2016-04-25 DIAGNOSIS — I739 Peripheral vascular disease, unspecified: Secondary | ICD-10-CM | POA: Diagnosis not present

## 2016-04-25 DIAGNOSIS — I6789 Other cerebrovascular disease: Secondary | ICD-10-CM | POA: Diagnosis not present

## 2016-04-25 DIAGNOSIS — F0150 Vascular dementia without behavioral disturbance: Secondary | ICD-10-CM | POA: Diagnosis not present

## 2016-04-25 DIAGNOSIS — I69328 Other speech and language deficits following cerebral infarction: Secondary | ICD-10-CM | POA: Diagnosis not present

## 2016-04-25 DIAGNOSIS — F5104 Psychophysiologic insomnia: Secondary | ICD-10-CM | POA: Diagnosis not present

## 2016-04-25 DIAGNOSIS — G9389 Other specified disorders of brain: Secondary | ICD-10-CM | POA: Diagnosis not present

## 2016-04-25 DIAGNOSIS — F329 Major depressive disorder, single episode, unspecified: Secondary | ICD-10-CM | POA: Diagnosis not present

## 2016-04-25 DIAGNOSIS — R509 Fever, unspecified: Secondary | ICD-10-CM

## 2016-04-25 DIAGNOSIS — R488 Other symbolic dysfunctions: Secondary | ICD-10-CM | POA: Diagnosis not present

## 2016-04-25 DIAGNOSIS — I69398 Other sequelae of cerebral infarction: Secondary | ICD-10-CM | POA: Diagnosis not present

## 2016-04-25 DIAGNOSIS — I6300 Cerebral infarction due to thrombosis of unspecified precerebral artery: Secondary | ICD-10-CM | POA: Diagnosis not present

## 2016-04-25 DIAGNOSIS — F0390 Unspecified dementia without behavioral disturbance: Secondary | ICD-10-CM | POA: Diagnosis not present

## 2016-04-25 DIAGNOSIS — I69354 Hemiplegia and hemiparesis following cerebral infarction affecting left non-dominant side: Secondary | ICD-10-CM | POA: Diagnosis not present

## 2016-04-25 DIAGNOSIS — L89623 Pressure ulcer of left heel, stage 3: Secondary | ICD-10-CM | POA: Diagnosis not present

## 2016-04-25 LAB — CULTURE, BLOOD (ROUTINE X 2)
CULTURE: NO GROWTH
Culture: NO GROWTH

## 2016-04-25 LAB — PROTIME-INR
INR: 1.18
PROTHROMBIN TIME: 15 s (ref 11.4–15.2)

## 2016-04-25 MED ORDER — WARFARIN SODIUM 10 MG PO TABS: ORAL_TABLET | ORAL | 0 refills | Status: DC

## 2016-04-25 MED ORDER — WARFARIN SODIUM 5 MG PO TABS: 10.0000 mg | ORAL_TABLET | Freq: Once | ORAL | Status: DC

## 2016-04-25 NOTE — NC FL2 (Signed)
[place] MEDICAID FL2 LEVEL OF CARE SCREENING TOOL     IDENTIFICATION  Patient Name: Calvin Diaz Birthdate: 09-29-1940 Sex: male Admission Date (Current Location): 04/20/2016  [facility] and [place] Number:  Producer, television/film/video and Address:  [facility], [address], [place], [place] [address]      Provider Number: [id number]  Attending Physician Name and Address:  Richarda Overlie, MD  Relative Name and Phone Number:       Current Level of Care: Hospital Recommended Level of Care: Skilled Nursing Facility Prior Approval Number:    Date Approved/Denied:   PASRR Number: [id number]  Discharge Plan: SNF    Current Diagnoses: Patient Active Problem List   Diagnosis Date Noted  . Acute CVA (cerebrovascular accident) (HCC) 04/21/2016  . Stroke (HCC) 04/20/2016  . Seizures (HCC) 03/31/2016  . Pressure injury of skin 12/20/2015  . Elbow swelling, left   . Left elbow pain   . Delirium   . Dysphagia, post-stroke   . Tachycardia   . Leukocytosis   . Acute blood loss anemia   . Polyarticular arthritis   . Swelling   . CVA (cerebral vascular accident) (HCC) 12/14/2015  . Hypokalemia 12/14/2015  . Fever 12/13/2015  . OAB (overactive bladder) 09/20/2015  . Essential hypertension 09/07/2015  . Atrial fibrillation (HCC) 09/07/2015  . Dementia 09/07/2015  . History of CVA with residual deficit 08/18/2015  . Former smoker 08/18/2015  . History of alcohol abuse 08/18/2015  . Hyperlipidemia 08/18/2015    Orientation RESPIRATION BLADDER Height & Weight     Self  Normal Incontinent, External catheter Weight: 136 lb 11.2 oz (62 kg) Height:   (180.3 cm)  BEHAVIORAL SYMPTOMS/MOOD NEUROLOGICAL BOWEL NUTRITION STATUS      Continent Diet (cardiac)  AMBULATORY STATUS COMMUNICATION OF NEEDS Skin   Extensive Assist Verbally PU Stage and Appropriate Care (unstageable on heel foam dressing change q3days)                       Personal Care  Assistance Level of Assistance  Bathing, Dressing Bathing Assistance: Maximum assistance   Dressing Assistance: Maximum assistance     Functional Limitations Info             SPECIAL CARE FACTORS FREQUENCY  PT (By licensed PT), OT (By licensed OT)     PT Frequency: 5/wk OT Frequency: 5/wk            Contractures      Additional Factors Info  Code Status, Allergies, Psychotropic Code Status Info: DNR Allergies Info: NKA Psychotropic Info: celexa, depakote         Current Medications (04/25/2016):  This is the current hospital active medication list Current Facility-Administered Medications  Medication Dose Route Frequency Provider Last Rate Last Dose  . 0.9 %  sodium chloride infusion   Intravenous Continuous Cathren Laine, MD      . acetaminophen (TYLENOL) tablet 650 mg  650 mg Oral Q6H PRN Jerald Kief, MD       Or  . acetaminophen (TYLENOL) suppository 650 mg  650 mg Rectal Q6H PRN Jerald Kief, MD      . citalopram (CELEXA) tablet 20 mg  20 mg Oral Daily Jerald Kief, MD   20 mg at 04/24/16 [id number]  . clopidogrel (PLAVIX) tablet 75 mg  75 mg Oral Daily Jerald Kief, MD   75 mg at 04/24/16 0942  . colchicine tablet 0.6 mg  0.6 mg Oral Daily Jerald Kief, MD   0.6 mg at 04/24/16 1610  . divalproex (DEPAKOTE SPRINKLE) capsule 500 mg  500 mg Oral BID Jerald Kief, MD   500 mg at 04/24/16 2249  . memantine (NAMENDA) tablet 10 mg  10 mg Oral Daily Jerald Kief, MD   10 mg at 04/24/16 0942  . pravastatin (PRAVACHOL) tablet 80 mg  80 mg Oral QHS Jerald Kief, MD   80 mg at 04/24/16 2250  . sodium chloride flush (NS) 0.9 % injection 3 mL  3 mL Intravenous Q12H Jerald Kief, MD   3 mL at 04/24/16 2250  . terazosin (HYTRIN) capsule 1 mg  1 mg Oral QHS Jerald Kief, MD   1 mg at 04/24/16 2250  . Warfarin - Pharmacist Dosing Inpatient   Does not apply q1800 Richarda Overlie, MD         Discharge Medications: Please see discharge summary for a list of discharge  medications.  Relevant Imaging Results:  Relevant Lab Results:   Additional Information SSN: [id number]  Burna Sis, LCSW

## 2016-04-25 NOTE — Care Management Important Message (Signed)
Important Message  Patient Details  Name: Calvin Diaz MRN: [id number] Date of Birth: [birth year]   Medicare Important Message Given:  Yes    Dorena Bodo 04/25/2016, 2:27 PM

## 2016-04-25 NOTE — Progress Notes (Signed)
Report given to [facility].

## 2016-04-25 NOTE — Progress Notes (Signed)
ANTICOAGULATION CONSULT NOTE   Pharmacy Consult for warfarin Indication: atrial fibrillation  No Known Allergies  Patient Measurements: Height:  (180.3 cm) Weight: 136 lb 11.2 oz (62 kg) IBW/kg (Calculated) : 75.3  Vital Signs: Temp: 97.9 F (36.6 C) (04/03 0941) Temp Source: Oral (04/03 0941) BP: 113/71 (04/03 0941) Pulse Rate: 85 (04/03 0941)  Labs:  Recent Labs  04/23/16 0400 04/24/16 0632 04/25/16 0407  HGB 10.7*  --   --   HCT 32.8*  --   --   PLT 289  --   --   LABPROT 15.2 14.8 15.0  INR 1.19 1.15 1.18  CREATININE 0.94 0.88  --     Estimated Creatinine Clearance: 63.6 mL/min (by C-G formula based on SCr of 0.88 mg/dL).   Medical History: Past Medical History:  Diagnosis Date  . Dementia   . High cholesterol   . Hypertension   . Myocardial infarction   . Stroke [facility])     Assessment: 76 yo male with hx of CVA who was previously on Xarelto was admitted with another acute CVA. He was not taking Xarelto prior to admission secondary to cost. He was restarted on Xarelto on 3/29 and is now being transitioned to warfarin. Last dose of Xarelto on 3/30 pm.   INR = 1.18 today (no change)  Goal of Therapy:  INR 2-3 Monitor platelets by anticoagulation protocol: Yes   Plan:  -Warfarin 10 mg po x 1 tonight -Daily INR  Thank you Okey Regal, PharmD [phone] 04/25/2016, 10:50 AM

## 2016-04-25 NOTE — Progress Notes (Signed)
Patient left unit with PTAR by stretcher with all belongings.

## 2016-04-25 NOTE — Discharge Summary (Signed)
Physician Discharge Summary  Calvin Diaz MRN: [id number] DOB/AGE: [birth year] 76 y.o.  PCP: [facility]   Admit date: 04/20/2016 Discharge date: 04/25/2016  Discharge Diagnoses:    Active Problems:   History of CVA with residual deficit   Hyperlipidemia   Essential hypertension   Atrial fibrillation (HCC)   Dementia   CVA (cerebral vascular accident) ([place])   Leukocytosis   Polyarticular arthritis   Stroke [facility])   Acute CVA (cerebrovascular accident) ([place])    Follow-up recommendations Follow-up with PCP in 3-5 days , including all  additional recommended appointments as below Follow-up CBC, CMP in 3-5 days Please check INR on a regular basis-needs ongoing adjustment of Coumadin dosing based on INR, goal 2-3       Current Discharge Medication List    START taking these medications   Details  warfarin (COUMADIN) 10 MG tablet 10 mg  Tuesday, Thursday 7.5 mg Monday, Wednesday, Friday, Saturday Sunday Qty: 45 tablet, Refills: 0      CONTINUE these medications which have NOT CHANGED   Details  citalopram (CELEXA) 20 MG tablet Take 1 tablet (20 mg total) by mouth daily. Qty: 30 tablet, Refills: 0    clopidogrel (PLAVIX) 75 MG tablet Take 1 tablet (75 mg total) by mouth daily. Qty: 30 tablet, Refills: 11    colchicine 0.6 MG tablet Take 1 tablet (0.6 mg total) by mouth daily. Qty: 30 tablet, Refills: 0    divalproex (DEPAKOTE SPRINKLES) 125 MG capsule Take 4 capsules (500 mg total) by mouth 2 (two) times daily. Qty: 240 capsule, Refills: 11    memantine (NAMENDA) 5 MG tablet Take 2 tablets (10 mg total) by mouth daily. Qty: 90 tablet, Refills: 3    Multiple Vitamin (MULTIVITAMIN WITH MINERALS) TABS tablet Take 1 tablet by mouth daily.    pravastatin (PRAVACHOL) 80 MG tablet Take 1 tablet (80 mg total) by mouth at bedtime. Qty: 90 tablet, Refills: 3    terazosin (HYTRIN) 1 MG capsule Take 1 capsule (1 mg total) by mouth at bedtime. Qty:  30 capsule, Refills: 5   Associated Diagnoses: Nocturia; OAB (overactive bladder)      STOP taking these medications     amLODipine (NORVASC) 5 MG tablet          Discharge Condition: *Stable   Discharge Instructions Get Medicines reviewed and adjusted: Please take all your medications with you for your next visit with your Primary MD  Please request your Primary MD to go over all hospital tests and procedure/radiological results at the follow up, please ask your Primary MD to get all Hospital records sent to his/her office.  If you experience worsening of your admission symptoms, develop shortness of breath, life threatening emergency, suicidal or homicidal thoughts you must seek medical attention immediately by calling 911 or calling your MD immediately if symptoms less severe.  You must read complete instructions/literature along with all the possible adverse reactions/side effects for all the Medicines you take and that have been prescribed to you. Take any new Medicines after you have completely understood and accpet all the possible adverse reactions/side effects.   Do not drive when taking Pain medications.   Do not take more than prescribed Pain, Sleep and Anxiety Medications  Special Instructions: If you have smoked or chewed Tobacco in the last 2 yrs please stop smoking, stop any regular Alcohol and or any Recreational drug use.  Wear Seat belts while driving.  Please note  You were cared for by  a hospitalist during your hospital stay. Once you are discharged, your primary care physician will handle any further medical issues. Please note that NO REFILLS for any discharge medications will be authorized once you are discharged, as it is imperative that you return to your primary care physician (or establish a relationship with a primary care physician if you do not have one) for your aftercare needs so that they can reassess your need for medications and monitor your  lab values.  Discharge Instructions    Diet - low sodium heart healthy    Complete by:  As directed    Increase activity slowly    Complete by:  As directed        No Known Allergies    Disposition: SNF   Consults:  neurology    Significant Diagnostic Studies:  Ct Head Wo Contrast  Result Date: 04/20/2016 CLINICAL DATA:  Slurred speech. EXAM: CT HEAD WITHOUT CONTRAST TECHNIQUE: Contiguous axial images were obtained from the base of the skull through the vertex without intravenous contrast. COMPARISON:  CT scan of February 06, 2016. FINDINGS: Brain: Stable encephalomalacia is seen involving the right MCA territory consistent with old infarction. Stable left frontal encephalomalacia is noted consistent with old infarction. No mass effect or midline shift is noted. Ventricular size is within normal limits. There is no evidence of mass lesion, hemorrhage or acute infarction. Vascular: No hyperdense vessel or unexpected calcification. Skull: Normal. Negative for fracture or focal lesion. Sinuses/Orbits: No acute finding. Other: None. IMPRESSION: Old bilateral cerebral infarctions. No acute intracranial abnormality seen. Electronically Signed   By: Marijo Conception, M.D.   On: 04/20/2016 15:40   Mr Brain Wo Contrast  Result Date: 04/20/2016 CLINICAL DATA:  History of stroke with worsening slurred speech and generalized weakness. EXAM: MRI HEAD WITH AND WITHOUT CONTRAST TECHNIQUE: Multiplanar, multiecho pulse sequences of the brain and surrounding structures were obtained with and without intravenous contrast. CONTRAST:  68m MULTIHANCE GADOBENATE DIMEGLUMINE 529 MG/ML IV SOLN COMPARISON:  12/14/2015 MRI of the head. 04/20/2016 and CT of the head. FINDINGS: Brain: Small focus of diffusion restriction within the right superolateral parietal lobe at the margins of an area of encephalomalacia from prior infarction compatible with interval acute infarct. There are multiple additional areas of  encephalomalacia within the right occipital lobe, right posterior temporal lobe, bilateral frontal lobes, and right insula compatible with chronic infarction. The infarctions in the right posterior temporal, right parietal, and bilateral superolateral frontal lobes demonstrate susceptibility hypointensity compatible with hemosiderin deposition from prior hemorrhage. This is most pronounced in the right superolateral frontal lobe (series 9, image 74) where the prominent susceptibility artifact gives the appearance of mass effect. The signal pattern is unchanged when compared with the prior MRI of the brain. There is no evidence of extra-axial collection, hydrocephalus, or effacement of basilar cisterns. After administration of intravenous contrast there is no abnormal enhancement of the brain. There is slight smooth pachymeningeal thickening which is likely the result of volume loss in the brain and altered flow dynamics post multiple infarctions. Vascular: Normal flow voids. Skull and upper cervical spine: Normal marrow signal. Sinuses/Orbits: Mild maxillary sinus mucosal thickening. Otherwise negative. Other: None. IMPRESSION: 1. Small focus of acute infarction within the right superolateral parietal lobe at the margins of a prior chronic right MCA infarction. 2. Multiple stable chronic infarcts of the supratentorial brain bilaterally. Electronically Signed   By: LKristine GarbeM.D.   On: 04/20/2016 22:41   Mr Brain W Contrast  Result  Date: 04/20/2016 CLINICAL DATA:  History of stroke with worsening slurred speech and generalized weakness. EXAM: MRI HEAD WITH AND WITHOUT CONTRAST TECHNIQUE: Multiplanar, multiecho pulse sequences of the brain and surrounding structures were obtained with and without intravenous contrast. CONTRAST:  96m MULTIHANCE GADOBENATE DIMEGLUMINE 529 MG/ML IV SOLN COMPARISON:  12/14/2015 MRI of the head. 04/20/2016 and CT of the head. FINDINGS: Brain: Small focus of diffusion  restriction within the right superolateral parietal lobe at the margins of an area of encephalomalacia from prior infarction compatible with interval acute infarct. There are multiple additional areas of encephalomalacia within the right occipital lobe, right posterior temporal lobe, bilateral frontal lobes, and right insula compatible with chronic infarction. The infarctions in the right posterior temporal, right parietal, and bilateral superolateral frontal lobes demonstrate susceptibility hypointensity compatible with hemosiderin deposition from prior hemorrhage. This is most pronounced in the right superolateral frontal lobe (series 9, image 74) where the prominent susceptibility artifact gives the appearance of mass effect. The signal pattern is unchanged when compared with the prior MRI of the brain. There is no evidence of extra-axial collection, hydrocephalus, or effacement of basilar cisterns. After administration of intravenous contrast there is no abnormal enhancement of the brain. There is slight smooth pachymeningeal thickening which is likely the result of volume loss in the brain and altered flow dynamics post multiple infarctions. Vascular: Normal flow voids. Skull and upper cervical spine: Normal marrow signal. Sinuses/Orbits: Mild maxillary sinus mucosal thickening. Otherwise negative. Other: None. IMPRESSION: 1. Small focus of acute infarction within the right superolateral parietal lobe at the margins of a prior chronic right MCA infarction. 2. Multiple stable chronic infarcts of the supratentorial brain bilaterally. Electronically Signed   By: LKristine GarbeM.D.   On: 04/20/2016 22:41   Dg Chest Port 1 View  Result Date: 04/20/2016 CLINICAL DATA:  Initial evaluation for increase generalized weakness, slurred speech. EXAM: PORTABLE CHEST 1 VIEW COMPARISON:  Prior radiograph from 02/06/2016. FINDINGS: Cardiac and mediastinal silhouettes are stable in size and contour, and remain  within normal limits. Aortic atherosclerosis noted. Lungs normally inflated. No focal infiltrates. No pulmonary edema. No definite pleural effusion, although the costophrenic angles are incompletely visualized. No pneumothorax. No acute osseous abnormality. Rounded opacity overlying the medial left hemidiaphragm suspected to reflect a a hernia. IMPRESSION: 1. No active cardiopulmonary disease. 2. Well-circumscribed rounded opacity overlying the medial left hemidiaphragm. Finding suspected to reflect a hernia. 3. Aortic atherosclerosis. Electronically Signed   By: BJeannine BogaM.D.   On: 04/20/2016 17:14       Filed Weights   04/20/16 1327 04/20/16 2130  Weight: 64.4 kg (142 lb) 62 kg (136 lb 11.2 oz)     Microbiology: Recent Results (from the past 240 hour(s))  Blood Culture (routine x 2)     Status: None   Collection Time: 04/20/16  5:14 PM  Result Value Ref Range Status   Specimen Description BLOOD RIGHT ANTECUBITAL  Final   Special Requests BOTTLES DRAWN AEROBIC AND ANAEROBIC 5CC  Final   Culture NO GROWTH 5 DAYS  Final   Report Status 04/25/2016 FINAL  Final  Blood Culture (routine x 2)     Status: None   Collection Time: 04/20/16  5:20 PM  Result Value Ref Range Status   Specimen Description BLOOD RIGHT FOREARM  Final   Special Requests BOTTLES DRAWN AEROBIC AND ANAEROBIC 5CC  Final   Culture NO GROWTH 5 DAYS  Final   Report Status 04/25/2016 FINAL  Final  Blood Culture    Component Value Date/Time   SDES BLOOD RIGHT FOREARM 04/20/2016 1720   SPECREQUEST BOTTLES DRAWN AEROBIC AND ANAEROBIC 5CC 04/20/2016 1720   CULT NO GROWTH 5 DAYS 04/20/2016 1720   REPTSTATUS 04/25/2016 FINAL 04/20/2016 1720      Labs: Results for orders placed or performed during the hospital encounter of 04/20/16 (from the past 48 hour(s))  Protime-INR     Status: None   Collection Time: 04/24/16  6:32 AM  Result Value Ref Range   Prothrombin Time 14.8 11.4 - 15.2 seconds   INR  8.67   Basic metabolic panel     Status: Abnormal   Collection Time: 04/24/16  6:32 AM  Result Value Ref Range   Sodium 136 135 - 145 mmol/L   Potassium 3.7 3.5 - 5.1 mmol/L   Chloride 100 (L) 101 - 111 mmol/L   CO2 27 22 - 32 mmol/L   Glucose, Bld 94 65 - 99 mg/dL   BUN 13 6 - 20 mg/dL   Creatinine, Ser 0.88 0.61 - 1.24 mg/dL   Calcium 9.1 8.9 - 10.3 mg/dL   GFR calc non Af Amer >60 >60 mL/min   GFR calc Af Amer >60 >60 mL/min    Comment: (NOTE) The eGFR has been calculated using the CKD EPI equation. This calculation has not been validated in all clinical situations. eGFR's persistently <60 mL/min signify possible Chronic Kidney Disease.    Anion gap 9 5 - 15  Glucose, capillary     Status: None   Collection Time: 04/24/16 11:44 AM  Result Value Ref Range   Glucose-Capillary 85 65 - 99 mg/dL  Protime-INR     Status: None   Collection Time: 04/25/16  4:07 AM  Result Value Ref Range   Prothrombin Time 15.0 11.4 - 15.2 seconds   INR 1.18      Lipid Panel     Component Value Date/Time   CHOL 104 04/23/2016 0400   TRIG 55 04/23/2016 0400   HDL 39 (L) 04/23/2016 0400   CHOLHDL 2.7 04/23/2016 0400   VLDL 11 04/23/2016 0400   LDLCALC 54 04/23/2016 0400     Lab Results  Component Value Date   HGBA1C 6.0 (H) 04/23/2016   HGBA1C 6.2 (H) 12/16/2015       HPI :  76 y.o.malewith medical history significant of CVA from 11/18, afib, HTN, gouty arthritis, dementia who was admitted 11/17 with slurred speech, found to have a small area of acute ischemia superimposed on the area of encephalomalacia associated with remote right MCA infarct.Patient was seen by Neurology who recommended continued xarelto on discharge. Chart reviewed. On follow up with Neurology on 03/30/16, patient was found to be no longer taking xarelto secondary to cost. Patient returns today with complaints of worsened slurred speech starting 7-8pm on 3/28 with generalized weakness.. Patient admitted for stroke  workup  HOSPITAL COURSE:   Acute CVA due to noncompliance with anticoagulation Small focus of acute infarction within the right superolateral parietal lobe at the margins of a prior chronic right MCA infarction.2. Multiple stable chronic infarcts of the supratentorial brain bilaterally. 2D Echo- 09/08/2015- EF 60 - 65 %, no repeat echo done this admission LDL 54, hemoglobin A1c  6.0 MRA- not performed. CTA H&N Nov 2017 Xarelto now changed over to Coumadin  plan is to initiate warfarin without a bridge per neurology. Coumadin dosing per pharmacy , monitor INR closely ok to continue concomitant plavix (discussed with Dr Leonie Man) Neurology following PT/OT-SNF , /  SLP-regular, thin liquids Continue statin SNF  , Charlett Blake, MD feels that the patient is appropriate for SNF   Dyslipidemia: LDL 55-continue statin  Hypokalemia-repleted  Persistent Atrial fibrillation: Currently in sinus rhythm-Not on any rate control medications, CHADSVASC score of 4. Was seen by cardiology as an outpatient on 11/15-planswere for DCCV-after 4 weeks of uninterrupted Xarelto therapy-followed by Dr.Jonathan Adora Fridge, MD, last seen 04/11/16. Noncompliant with Xarelto, switched over to Coumadin  Hypertension:Hold amlodipine and continue Terazosin, to allow for permissive hypertension  Dementia with mild delirium:Continue Namenda.  Chronic insomnia:Resume belsomra as outpatient  Patchy apical lung opacities:Seen on CT angiogram of the neck, no obvious infiltrates seen on chest x-ray. Radiology recommends to repeat CT in 6 months.   Peripheral vascular disease Doppler studies performed in our office 03/01/16 revealed a right ABI 0.95 and a left of 1.1 with three-vessel runoff bilaterally. He did have a moderate lesion in his distal right SFA.Marland KitchenHe denies claudication. He does have a palpable pedal pulse on the right, on Plavix   Discharge Exam:   Blood pressure 113/71, pulse 85, temperature  97.9 F (36.6 C), temperature source Oral, resp. rate 18, height 5' 11" (1.803 m), weight 62 kg (136 lb 11.2 oz), SpO2 98 %. General exam: Appears calm and comfortable  Respiratory system: Clear to auscultation. Respiratory effort normal. Cardiovascular system: S1 & S2 heard, RRR. No JVD, murmurs, rubs, gallops or clicks. No pedal edema. Gastrointestinal system: Abdomen is nondistended, soft and nontender. No organomegaly or masses felt. Normal bowel sounds heard. Central nervous system: Alert  , expressive aphasia  Extremities: Symmetric 5 x 5 power. Skin: No rashes, lesions or ulcers Psychiatry: Judgement imapired       Contact information for after-discharge care    Destination    [facility] .   Specialty:  [place] information: [address] [place] [phone]              SignedReyne Dumas 04/25/2016, 12:59 PM        Time spent >45 mins

## 2016-04-25 NOTE — Progress Notes (Signed)
Rehab admissions - I met with patient and then I called his wife with his permission.  Wife was struggling at home caring for patient and now patient with a new CVA.  Wife is in agreement to SNF placement until patient is much more mobile.  I did speak with Dr. Naaman Plummer about patient and we are in agreement to SNF as best option for patient and his family at this time.  Wife would like to be called and notified if patient discharges out of hospital today.  #[phone]

## 2016-04-25 NOTE — Progress Notes (Signed)
Attempt report to [facility], no one able to come to phone at this time.

## 2016-04-25 NOTE — Clinical Social Work Note (Signed)
Clinical Social Work Assessment  Patient Details  Name: Calvin Diaz MRN: [id number] Date of Birth: May 10, 1940  Date of referral:  04/25/16               Reason for consult:  Facility Placement                Permission sought to share information with:    Permission granted to share information::     Name::     Tax inspector::     Relationship::  Spouse  Contact Information:     Housing/Transportation Living arrangements for the past 2 months:  Single Family Home Source of Information:  Spouse Patient Interpreter Needed:  None Criminal Activity/Legal Involvement Pertinent to Current Situation/Hospitalization:  No - Comment as needed Significant Relationships:  Spouse, Adult Children Lives with:  Spouse Do you feel safe going back to the place where you live?    Need for family participation in patient care:     Care giving concerns:  Pt is only alert to self. CSW contacted spouse via telephone.   Social Worker assessment / plan:  CSW spoke with pt's spouse via telephone. Pt lives with spouse. Pt's spouse agreeable to SNF placement at d/c and prefers something close to their home. [facility] closest and pt's spouse is agreeable. CSW will follow up with facility and pt's spouse.  Employment status:  Retired Health and safety inspector:  Medicare PT Recommendations:  Skilled Nursing Facility Information / Referral to community resources:  Skilled Nursing Facility  Patient/Family's Response to care:  Pt's spouse verbalized understanding of CSW role and expressed appreciation for support. Pt's spouse denies any concern regarding pt care at this time.   Patient/Family's Understanding of and Emotional Response to Diagnosis, Current Treatment, and Prognosis:  Pt's spouse understanding and realistic regarding physical limitations. Pt's spouse understands the need for SNF placement at d/c. Pt's spouse agreeable to SNF placement at d/c, at this time. Pt's spouse denies any concern regarding  treatment plan at this time. CSW will continue to provide support and facilitate d/c needs.   Emotional Assessment Appearance:  Appears stated age Attitude/Demeanor/Rapport:  Unable to Assess Affect (typically observed):  Unable to Assess Orientation:  Oriented to Self Alcohol / Substance use:  Not Applicable Psych involvement (Current and /or in the community):  No (Comment)  Discharge Needs  Concerns to be addressed:  No discharge needs identified Readmission within the last 30 days:  No Current discharge risk:  Dependent with Mobility Barriers to Discharge:  Continued Medical Work up   [employer], LCSW 04/25/2016, 10:02 AM

## 2016-04-25 NOTE — Clinical Social Work Note (Signed)
Clinical Social Worker facilitated patient discharge including contacting patient family and facility to confirm patient discharge plans.  Clinical information faxed to facility and family agreeable with plan.  CSW arranged ambulance transport via [facility] to [facility] .  RN to call [phone] for report prior to discharge.  Clinical Social Worker will sign off for now as social work intervention is no longer needed. Please consult us again if new need arises.  Wave Calzada [place], LCSWA [phone]   

## 2016-04-25 NOTE — Clinical Social Work Placement (Signed)
   CLINICAL SOCIAL WORK PLACEMENT  NOTE  Date:  04/25/2016  Patient Details  Name: Calvin Diaz MRN: [id number] Date of Birth: September 13, 1940  Clinical Social Work is seeking post-discharge placement for this patient at the Skilled  Nursing Facility level of care (*CSW will initial, date and re-position this form in  chart as items are completed):      Patient/family provided with [facility] Clinical Social Work Department's list of facilities offering this level of care within the geographic area requested by the patient (or if unable, by the patient's family).  Yes   Patient/family informed of their freedom to choose among providers that offer the needed level of care, that participate in Medicare, Medicaid or managed care program needed by the patient, have an available bed and are willing to accept the patient.      Patient/family informed of [facility]'s ownership interest in [facility] and [facility], as well as of the fact that they are under no obligation to receive care at these facilities.  PASRR submitted to EDS on       PASRR number received on 04/25/16     Existing PASRR number confirmed on       FL2 transmitted to all facilities in geographic area requested by pt/family on 04/25/16     FL2 transmitted to all facilities within larger geographic area on       Patient informed that his/her managed care company has contracts with or will negotiate with certain facilities, including the following:        Yes   Patient/family informed of bed offers received.  Patient chooses bed at       Physician recommends and patient chooses bed at      Patient to be transferred to   on 04/25/16.  Patient to be transferred to facility by [facility]     Patient family notified on 04/25/16 of transfer.  Name of family member notified:  Valrie     PHYSICIAN Please prepare priority discharge summary, including medications, Please prepare prescriptions, Please sign FL2, Please sign  DNR     Additional Comment:    _______________________________________________ Maree Krabbe, LCSW 04/25/2016, 10:05 AM

## 2016-04-26 LAB — POCT INR: INR: 1.5 — AB (ref 0.9–1.1)

## 2016-04-26 LAB — CBC AND DIFFERENTIAL: Platelets: 18 10*3/uL — AB (ref 150–399)

## 2016-04-27 ENCOUNTER — Non-Acute Institutional Stay (SKILLED_NURSING_FACILITY): Payer: Medicare Other | Admitting: Internal Medicine

## 2016-04-27 ENCOUNTER — Encounter: Payer: Self-pay | Admitting: Internal Medicine

## 2016-04-27 DIAGNOSIS — I10 Essential (primary) hypertension: Secondary | ICD-10-CM | POA: Diagnosis not present

## 2016-04-27 DIAGNOSIS — I639 Cerebral infarction, unspecified: Secondary | ICD-10-CM | POA: Diagnosis not present

## 2016-04-27 DIAGNOSIS — F0150 Vascular dementia without behavioral disturbance: Secondary | ICD-10-CM | POA: Diagnosis not present

## 2016-04-27 NOTE — Progress Notes (Signed)
This is a comprehensive admission note to [facility] performed on this date less than 30 days from date of admission. Included are preadmission medical/surgical history;reconciled medication list; family history; social history and comprehensive review of systems.  Corrections and additions to the records were documented . Comprehensive physical exam was also performed. Additionally a clinical summary was entered for each active diagnosis pertinent to this admission in the Problem List to enhance continuity of care.  PCP:[facility]  HPI: The patient was hospitalized 3/29-04/25/16 Patient had been admitted 12/10/15 with slurred speech due to acute ischemia stroke in  an area of encephalomalacia associated with remote right MCA infarct. The stroke was in the context of paroxysmal  atrial fibrillation &t hypertension. Xarelto was prescribed but on follow-up 04/09/16 the patient had stopped the medication due to cost.  The patient was admitted 3/29 for slurred speech which began the evening of 3/28. Acute CVA was found within the right superior-lateral parietal lobe @ the margins of the prior chronic right MCA infarction. Multiple stable chronic infarcts of the supratentorial brain bilaterally. seen Xarelto was changed to Coumadin to enhance compliance. Plavix was continued as per Dr. Pearlean Brownie, Neurology CHADsVasc score was 4 ;[facility] had been planned by cardiology after 4 weeks of continuous Xarelto therapy Permissive hypertension was allowed and amlodipine was discontinued. The patient has chronic insomnia ;he had been on Belsomra previously. On CT angiogram of the neck patchy apical lung opacities were noted. Follow-up was recommended in 6 months.  Past medical and surgical history: MI, hypertension, dyslipidemia, and dementia. Surgeries include coronary angioplasty.  Social history: He does not drink. He is a former smoker.  Family history:  Reviewed  Review of systems: He voices no complaints but the validity is questionable due to dementia. Date given as Thursday, April 1993. He was unable to identify the president. Constitutional: No fever,significant weight change, fatigue  Cardiovascular: No chest pain, palpitations,paroxysmal nocturnal dyspnea, claudication, edema  Respiratory: No cough, sputum production,hemoptysis, DOE , significant snoring,apnea  Gastrointestinal: No heartburn,dysphagia,abdominal pain, nausea / vomiting,rectal bleeding, melena,change in bowels Genitourinary: No dysuria,hematuria, pyuria,  incontinence, nocturia Neurologic: No dizziness,headache,syncope, seizures, numbness , tingling Psychiatric: No significant anxiety , depression, insomnia, anorexia  Physical exam:  Pertinent or positive findings: Pattern alopecia is present. Heart rhythm is clinically regular today. He tends to ignore the left upper and left lower extremities. He does keep the left fingers extended. He can only form a partial fist on that side. When asked to raise 2 fingers on the left hand, he performs act with his right hand. Left upper and left lower extremity are weak. There is atrophy of the left > right  lower extremity. Clubbing of the nailbeds are present. Dorsalis pedis pulses are decreased.  General appearance:Adequately nourished; no acute distress , increased work of breathing is present.   Lymphatic: No lymphadenopathy about the head, neck, axilla . Eyes: No conjunctival inflammation or lid edema is present. There is no scleral icterus. Ears:  External ear exam shows no significant lesions or deformities.   Nose:  External nasal examination shows no deformity or inflammation. Nasal mucosa are pink and moist without lesions ,exudates Oral exam: lips and gums are healthy appearing.There is no oropharyngeal erythema or exudate . Neck:  No thyromegaly, masses, tenderness noted.    Heart:  Normal rate and regular rhythm. S1 and S2  normal without gallop, murmur, click, rub .  Lungs:Chest clear to auscultation without wheezes, rhonchi,rales ,  rubs. Abdomen:Bowel sounds are normal. Abdomen is soft and nontender with no organomegaly, hernias,masses. GU: deferred  Extremities:  No cyanosis,edema  Neurologic exam : Balance,Rhomberg,finger to nose testing could not be completed due to clinical state Skin: Warm & dry w/o tenting. No significant lesions or rash.  See clinical summary under each active problem in the Problem List with associated updated therapeutic plan

## 2016-04-27 NOTE — Assessment & Plan Note (Addendum)
Recurrent in the context of AF &  noncompliance with the oral anticoagulant Xarelto Continue warfarin

## 2016-04-27 NOTE — Assessment & Plan Note (Signed)
Permissive hypertension will be allowed as per neurology

## 2016-04-27 NOTE — Patient Instructions (Signed)
See assessment and plan under each diagnosis in the problem list and acutely for this visit 

## 2016-04-27 NOTE — Assessment & Plan Note (Signed)
Continue Namenda although I doubt its efficacy

## 2016-04-28 LAB — CBC AND DIFFERENTIAL
HCT: 37 % — AB (ref 41–53)
HEMOGLOBIN: 12.2 g/dL — AB (ref 13.5–17.5)
Platelets: 382 10*3/uL (ref 150–399)
WBC: 6.6 10^3/mL

## 2016-04-28 LAB — BASIC METABOLIC PANEL
BUN: 13 mg/dL (ref 4–21)
CREATININE: 0.7 mg/dL (ref 0.6–1.3)
GLUCOSE: 85 mg/dL
POTASSIUM: 4.1 mmol/L (ref 3.4–5.3)
SODIUM: 140 mmol/L (ref 137–147)

## 2016-04-28 LAB — POCT INR
INR: 1.3 — AB (ref 0.9–1.1)
INR: 1.3 — AB (ref 0.9–1.1)

## 2016-04-28 LAB — PROTIME-INR
PROTIME: 38.9 s — AB (ref 10.0–13.8)
Protime: 16.4 seconds — AB (ref 10.0–13.8)
Protime: 16.4 seconds — AB (ref 10.0–13.8)

## 2016-04-28 LAB — HEPATIC FUNCTION PANEL
ALT: 8 U/L — AB (ref 10–40)
AST: 14 U/L (ref 14–40)
Alkaline Phosphatase: 51 U/L (ref 25–125)

## 2016-05-01 LAB — POCT INR: INR: 4 — AB (ref <=1.1)

## 2016-05-02 DIAGNOSIS — L89623 Pressure ulcer of left heel, stage 3: Secondary | ICD-10-CM | POA: Diagnosis not present

## 2016-05-02 LAB — PROTIME-INR: PROTIME: 43.7 s — AB (ref 10.0–13.8)

## 2016-05-02 LAB — POCT INR: INR: 4.6 — AB (ref 0.9–1.1)

## 2016-05-04 LAB — POCT INR: INR: 2.4 — AB (ref 0.9–1.1)

## 2016-05-04 LAB — PROTIME-INR: Protime: 26 seconds — AB (ref 10.0–13.8)

## 2016-05-08 DIAGNOSIS — L89623 Pressure ulcer of left heel, stage 3: Secondary | ICD-10-CM | POA: Diagnosis not present

## 2016-05-08 LAB — POCT INR: INR: 6.3 — AB (ref 0.9–1.1)

## 2016-05-08 LAB — PROTIME-INR: Protime: 56 seconds — AB (ref 10.0–13.8)

## 2016-05-10 ENCOUNTER — Encounter: Payer: Self-pay | Admitting: Nurse Practitioner

## 2016-05-10 ENCOUNTER — Non-Acute Institutional Stay (SKILLED_NURSING_FACILITY): Payer: Medicare Other | Admitting: Nurse Practitioner

## 2016-05-10 DIAGNOSIS — I69354 Hemiplegia and hemiparesis following cerebral infarction affecting left non-dominant side: Secondary | ICD-10-CM

## 2016-05-10 DIAGNOSIS — R918 Other nonspecific abnormal finding of lung field: Secondary | ICD-10-CM

## 2016-05-10 DIAGNOSIS — I10 Essential (primary) hypertension: Secondary | ICD-10-CM

## 2016-05-10 DIAGNOSIS — F0150 Vascular dementia without behavioral disturbance: Secondary | ICD-10-CM | POA: Diagnosis not present

## 2016-05-10 MED ORDER — MEMANTINE HCL 5 MG PO TABS: 10.0000 mg | ORAL_TABLET | Freq: Every day | ORAL | 0 refills | Status: DC

## 2016-05-10 MED ORDER — TERAZOSIN HCL 1 MG PO CAPS: 1.0000 mg | ORAL_CAPSULE | Freq: Every day | ORAL | 0 refills | Status: DC

## 2016-05-10 MED ORDER — CITALOPRAM HYDROBROMIDE 20 MG PO TABS: 20.0000 mg | ORAL_TABLET | Freq: Every day | ORAL | 0 refills | Status: DC

## 2016-05-10 MED ORDER — DIVALPROEX SODIUM 125 MG PO CSDR: 125.0000 mg | DELAYED_RELEASE_CAPSULE | Freq: Four times a day (QID) | ORAL | 0 refills | Status: AC

## 2016-05-10 MED ORDER — PRAVASTATIN SODIUM 80 MG PO TABS: 80.0000 mg | ORAL_TABLET | Freq: Every day | ORAL | 0 refills | Status: DC

## 2016-05-10 MED ORDER — CLOPIDOGREL BISULFATE 75 MG PO TABS: 75.0000 mg | ORAL_TABLET | Freq: Every day | ORAL | 0 refills | Status: DC

## 2016-05-10 MED ORDER — COLCHICINE 0.6 MG PO TABS: 0.6000 mg | ORAL_TABLET | Freq: Every day | ORAL | 0 refills | Status: DC

## 2016-05-10 NOTE — Progress Notes (Signed)
Nursing Home Location:  [facility]  Place of Service: SNF (31)  PCP: Carollee Herter, MD  No Known Allergies  Chief Complaint  Patient presents with  . Discharge Note    Resident is being discharged from SNF.     HPI:  Patient is a 76 y.o. male seen today at [facility] for discharge home with wife. Pt with hx of dementia, MI, HTN, dyslipidemia. Patient had been admitted to the hospital on 12/10/15 with slurred speech due to acute ischemia stroke in  an area of encephalomalacia associated with remote right MCA infarct. The stroke was in the context of paroxysmal  atrial fibrillation &t hypertension. Xarelto was prescribed but on follow-up 04/09/16 the patient had stopped the medication due to cost. Then on 3/29 he went to ED for slurred speech which began the evening of 3/28. Acute CVA was found within the right superior-lateral parietal lobe @ the margins of the prior chronic right MCA infarction. Multiple stable chronic infarcts of the supratentorial brain bilaterally. Xarelto was changed to Coumadin to enhance compliance however INRs have been very labile at [facility] and currently on hold due to supratheraputic level.  Plavix was continued as per Dr. Pearlean Brownie Pt doing well with therapy and stable to dc home with wife.  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 Dementia   . High cholesterol   . Hypertension   . Myocardial infarction (HCC)   . Stroke [facility])    Past Surgical History:  Procedure Laterality Date  . CORONARY ANGIOPLASTY     Social History:   reports that he has quit smoking. His smoking use included Cigarettes. He quit smokeless tobacco use about 8 months ago. He reports that he does not drink alcohol or use drugs.  Family History  Problem Relation Age of Onset    . Arrhythmia Mother     Had pacemaker  . Stroke Mother   . Stroke Maternal Aunt   . Heart attack Father     Medications: Patient's Medications  New Prescriptions   No medications on file  Previous Medications   CITALOPRAM (CELEXA) 20 MG TABLET    Take 1 tablet (20 mg total) by mouth daily.   CLOPIDOGREL (PLAVIX) 75 MG TABLET    Take 1 tablet (75 mg total) by mouth daily.   COLCHICINE 0.6 MG TABLET    Take 1 tablet (0.6 mg total) by mouth daily.   DIVALPROEX (DEPAKOTE SPRINKLE) 125 MG CAPSULE    Take 125 mg by mouth 4 (four) times daily.   MEMANTINE (NAMENDA) 5 MG TABLET    Take 2 tablets (10 mg total) by mouth daily.   MULTIPLE VITAMIN (MULTIVITAMIN WITH MINERALS) TABS TABLET    Take 1 tablet by mouth daily.   PRAVASTATIN (PRAVACHOL) 80 MG TABLET    Take 1 tablet (80 mg total) by mouth at bedtime.   TERAZOSIN (HYTRIN) 1 MG CAPSULE    Take 1 capsule (1 mg total) by mouth at bedtime.  Modified Medications   No medications on file  Discontinued Medications   DIVALPROEX (DEPAKOTE SPRINKLES) 125 MG CAPSULE    Take 4 capsules (500 mg total) by mouth 2 (two) times daily.   WARFARIN (COUMADIN) 10 MG TABLET    10 mg  Tuesday, Thursday 7.5 mg Monday, Wednesday,  Friday, Saturday Sunday     Physical Exam: Vitals:   05/10/16 1128  BP: 111/68  Pulse: 82  Resp: 18  Temp: 98.4 F (36.9 C)  SpO2: 96%  Weight: 136 lb (61.7 kg)  Height:  (1.803 m)    Physical Exam  Constitutional: He appears well-developed and well-nourished. No distress.  HENT:  Head: Normocephalic and atraumatic.  Mouth/Throat: Oropharynx is clear and moist. No oropharyngeal exudate.  Eyes: Conjunctivae and EOM are normal. Pupils are equal, round, and reactive to light.  Neck: Normal range of motion. Neck supple.  Cardiovascular: Normal rate, regular rhythm and normal heart sounds.   Pulmonary/Chest: Effort normal and breath sounds normal.  Abdominal: Soft. Bowel sounds are normal.  Musculoskeletal: He  exhibits no edema or tenderness.  Left sided weakness with neglect.   Neurological: He is alert.  Oriented to self only  Skin: Skin is warm and dry. He is not diaphoretic.  Psychiatric: He has a normal mood and affect.    Labs reviewed: Basic Metabolic Panel:  Recent Labs  29/56/21 0300  12/15/15 0911  02/06/16 1255  04/21/16 0318 04/23/16 0400 04/24/16 0632 04/28/16 1019  NA  --   < > 136  < > 143  < > 141 138 136 140  K  --   < > 3.2*  < > 3.9  < > 3.2* 3.7 3.7 4.1  CL  --   < > 104  < > 107  < > 102 100* 100*  --   CO2  --   < > 24  < > 28  < > --   GLUCOSE  --   < > 132*  < > 83  < > 95 99 94  --   BUN  --   < > 8  < > 8  < > CREATININE  --   < > 0.65  < > 0.87  < > 0.87 0.94 0.88 0.7  CALCIUM  --   < > 8.5*  < > 8.8*  < > 9.2 8.9 9.1  --   MG 2.0  --  1.8  --  1.8  --   --   --   --   --   < > = values in this interval not displayed. Liver Function Tests:  Recent Labs  02/06/16 1255 04/20/16 1436 04/21/16 0318 04/28/16 1019  AST 33 ALT 11* 11* 12* 8*  ALKPHOS 41 50 46 51  BILITOT 0.6 0.5 0.8  --   PROT 6.2* 7.5 7.1  --   ALBUMIN 2.8* 3.6 3.3*  --     Recent Labs  12/13/15 1615  LIPASE 20   No results for input(s): AMMONIA in the last 8760 hours. CBC:  Recent Labs  12/13/15 1615  12/21/15 0622 02/06/16 1255  04/20/16 1436 04/21/16 0318 04/23/16 0400 04/26/16 04/28/16 1019  WBC 13.1*  < > 14.4* 6.5  --  10.7* 12.4* 7.6  --  6.6  NEUTROABS 10.3*  --  10.3* 3.0  --   --   --   --   --   --   HGB 12.5*  < > 11.8* 11.2*  < > 11.9* 10.8* 10.7*  --  12.2*  HCT 36.9*  < > 34.5* 34.7*  < > 36.8* 33.3* 32.8*  --  37*  MCV 88.7  < > 85.8 90.1  --  90.4 89.8 88.9  --   --  PLT 416*  < > 680* 263  --  233 259 289 18* 382  < > = values in this interval not displayed. TSH:  Recent Labs  09/07/15 0300 12/13/15 1615  TSH 0.639 1.220   A1C: Lab Results  Component Value Date   HGBA1C 6.0 (H) 04/23/2016   Lipid  Panel:  Recent Labs  08/18/15 1512 12/16/15 0445 04/23/16 0400  CHOL 127 100 104  HDL 57 36* 39*  LDLCALC 57 55 54  TRIG 64 47 55  CHOLHDL 2.2 2.8 2.7    Radiological Exams: Ct Head Wo Contrast  Result Date: 04/20/2016 CLINICAL DATA:  Slurred speech. EXAM: CT HEAD WITHOUT CONTRAST TECHNIQUE: Contiguous axial images were obtained from the base of the skull through the vertex without intravenous contrast. COMPARISON:  CT scan of February 06, 2016. FINDINGS: Brain: Stable encephalomalacia is seen involving the right MCA territory consistent with old infarction. Stable left frontal encephalomalacia is noted consistent with old infarction. No mass effect or midline shift is noted. Ventricular size is within normal limits. There is no evidence of mass lesion, hemorrhage or acute infarction. Vascular: No hyperdense vessel or unexpected calcification. Skull: Normal. Negative for fracture or focal lesion. Sinuses/Orbits: No acute finding. Other: None. IMPRESSION: Old bilateral cerebral infarctions. No acute intracranial abnormality seen. Electronically Signed   By: Lupita Raider, M.D.   On: 04/20/2016 15:40   Mr Brain Wo Contrast  Result Date: 04/20/2016 CLINICAL DATA:  History of stroke with worsening slurred speech and generalized weakness. EXAM: MRI HEAD WITH AND WITHOUT CONTRAST TECHNIQUE: Multiplanar, multiecho pulse sequences of the brain and surrounding structures were obtained with and without intravenous contrast. CONTRAST:  14mL MULTIHANCE GADOBENATE DIMEGLUMINE 529 MG/ML IV SOLN COMPARISON:  12/14/2015 MRI of the head. 04/20/2016 and CT of the head. FINDINGS: Brain: Small focus of diffusion restriction within the right superolateral parietal lobe at the margins of an area of encephalomalacia from prior infarction compatible with interval acute infarct. There are multiple additional areas of encephalomalacia within the right occipital lobe, right posterior temporal lobe, bilateral frontal  lobes, and right insula compatible with chronic infarction. The infarctions in the right posterior temporal, right parietal, and bilateral superolateral frontal lobes demonstrate susceptibility hypointensity compatible with hemosiderin deposition from prior hemorrhage. This is most pronounced in the right superolateral frontal lobe (series 9, image 74) where the prominent susceptibility artifact gives the appearance of mass effect. The signal pattern is unchanged when compared with the prior MRI of the brain. There is no evidence of extra-axial collection, hydrocephalus, or effacement of basilar cisterns. After administration of intravenous contrast there is no abnormal enhancement of the brain. There is slight smooth pachymeningeal thickening which is likely the result of volume loss in the brain and altered flow dynamics post multiple infarctions. Vascular: Normal flow voids. Skull and upper cervical spine: Normal marrow signal. Sinuses/Orbits: Mild maxillary sinus mucosal thickening. Otherwise negative. Other: None. IMPRESSION: 1. Small focus of acute infarction within the right superolateral parietal lobe at the margins of a prior chronic right MCA infarction. 2. Multiple stable chronic infarcts of the supratentorial brain bilaterally. Electronically Signed   By: Mitzi Hansen M.D.   On: 04/20/2016 22:41   Mr Brain W Contrast  Result Date: 04/20/2016 CLINICAL DATA:  History of stroke with worsening slurred speech and generalized weakness. EXAM: MRI HEAD WITH AND WITHOUT CONTRAST TECHNIQUE: Multiplanar, multiecho pulse sequences of the brain and surrounding structures were obtained with and without intravenous contrast. CONTRAST:  14mL MULTIHANCE GADOBENATE  DIMEGLUMINE 529 MG/ML IV SOLN COMPARISON:  12/14/2015 MRI of the head. 04/20/2016 and CT of the head. FINDINGS: Brain: Small focus of diffusion restriction within the right superolateral parietal lobe at the margins of an area of  encephalomalacia from prior infarction compatible with interval acute infarct. There are multiple additional areas of encephalomalacia within the right occipital lobe, right posterior temporal lobe, bilateral frontal lobes, and right insula compatible with chronic infarction. The infarctions in the right posterior temporal, right parietal, and bilateral superolateral frontal lobes demonstrate susceptibility hypointensity compatible with hemosiderin deposition from prior hemorrhage. This is most pronounced in the right superolateral frontal lobe (series 9, image 74) where the prominent susceptibility artifact gives the appearance of mass effect. The signal pattern is unchanged when compared with the prior MRI of the brain. There is no evidence of extra-axial collection, hydrocephalus, or effacement of basilar cisterns. After administration of intravenous contrast there is no abnormal enhancement of the brain. There is slight smooth pachymeningeal thickening which is likely the result of volume loss in the brain and altered flow dynamics post multiple infarctions. Vascular: Normal flow voids. Skull and upper cervical spine: Normal marrow signal. Sinuses/Orbits: Mild maxillary sinus mucosal thickening. Otherwise negative. Other: None. IMPRESSION: 1. Small focus of acute infarction within the right superolateral parietal lobe at the margins of a prior chronic right MCA infarction. 2. Multiple stable chronic infarcts of the supratentorial brain bilaterally. Electronically Signed   By: Mitzi Hansen M.D.   On: 04/20/2016 22:41   Dg Chest Port 1 View  Result Date: 04/20/2016 CLINICAL DATA:  Initial evaluation for increase generalized weakness, slurred speech. EXAM: PORTABLE CHEST 1 VIEW COMPARISON:  Prior radiograph from 02/06/2016. FINDINGS: Cardiac and mediastinal silhouettes are stable in size and contour, and remain within normal limits. Aortic atherosclerosis noted. Lungs normally inflated. No focal  infiltrates. No pulmonary edema. No definite pleural effusion, although the costophrenic angles are incompletely visualized. No pneumothorax. No acute osseous abnormality. Rounded opacity overlying the medial left hemidiaphragm suspected to reflect a a hernia. IMPRESSION: 1. No active cardiopulmonary disease. 2. Well-circumscribed rounded opacity overlying the medial left hemidiaphragm. Finding suspected to reflect a hernia. 3. Aortic atherosclerosis. Electronically Signed   By: Rise Mu M.D.   On: 04/20/2016 17:14    Assessment/Plan 1. 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 Working with therapy here and will need home health upon discharge. conts on plavix.   2. Vascular dementia without behavioral disturbance Advanced disease, conts on namenda at this time.   3. Essential hypertension Stable, permissive hypertension per neurology.  4. Abnormal CT scan of lung On CT angiogram of the neck patchy apical lung opacities were noted. Follow-up was recommended in 6 months.  5. Long term anticoagulation. Pt was noncompliant with xarelto due to cost however INR has not been consistently at goal on coumadin and currently on hold due to supra-therapeutic INR. Once INR drops to <2 will start eliquis 5 mg twice daily.   pt is stable for discharge-will need PT/OT/nursing per home health. DME needed. Rx sent via epic for 1 month supply. will need to follow up with PCP within 2 weeks.   Janene Harvey. Biagio Borg  [facility] [phone] 8 am - 5 pm) [phone] (after hours)

## 2016-05-12 MED ORDER — APIXABAN 5 MG PO TABS: 5.0000 mg | ORAL_TABLET | Freq: Two times a day (BID) | ORAL | 0 refills | Status: DC

## 2016-05-12 NOTE — Addendum Note (Signed)
Addended by: Sharon Seller on: 05/12/2016 04:19 PM   Modules accepted: Orders

## 2016-05-12 NOTE — Progress Notes (Signed)
INR 1.6 on 4/20; will start eliquis 5 mg by mouth twice daily, Rx sent to pharmacy for 30 day supply

## 2016-05-15 ENCOUNTER — Telehealth: Payer: Self-pay | Admitting: Family Medicine

## 2016-05-15 DIAGNOSIS — I481 Persistent atrial fibrillation: Secondary | ICD-10-CM | POA: Diagnosis not present

## 2016-05-15 DIAGNOSIS — I69328 Other speech and language deficits following cerebral infarction: Secondary | ICD-10-CM | POA: Diagnosis not present

## 2016-05-15 DIAGNOSIS — I69398 Other sequelae of cerebral infarction: Secondary | ICD-10-CM | POA: Diagnosis not present

## 2016-05-15 DIAGNOSIS — I10 Essential (primary) hypertension: Secondary | ICD-10-CM | POA: Diagnosis not present

## 2016-05-15 DIAGNOSIS — M6281 Muscle weakness (generalized): Secondary | ICD-10-CM | POA: Diagnosis not present

## 2016-05-15 DIAGNOSIS — M62562 Muscle wasting and atrophy, not elsewhere classified, left lower leg: Secondary | ICD-10-CM | POA: Diagnosis not present

## 2016-05-15 NOTE — Telephone Encounter (Signed)
Ok

## 2016-05-15 NOTE — Telephone Encounter (Signed)
Arline Asp from [facility] called and wants to know if you are ok with Them ok doing PT and OT and Nursing and social work on [place], They went and done aessement on him today. She can be reached at [phone], she always wanted to make sure that you was aware that he was in [facility],

## 2016-05-16 ENCOUNTER — Telehealth: Payer: Self-pay | Admitting: Internal Medicine

## 2016-05-16 NOTE — Telephone Encounter (Signed)
Tiffany with [facility] called needing skilled nursing orders and dressing orders. Please call her back for verbal orders if ok'ed. [phone]  Skilled Nursing- twice a week for 8 weeks  Bilateral ulcers on heels- continue with collagen and rolled qauzes

## 2016-05-16 NOTE — Telephone Encounter (Signed)
Okay 

## 2016-05-16 NOTE — Telephone Encounter (Signed)
Tried to call but no answer

## 2016-05-16 NOTE — Telephone Encounter (Signed)
Gave melissa rn the okay

## 2016-05-17 DIAGNOSIS — I10 Essential (primary) hypertension: Secondary | ICD-10-CM | POA: Diagnosis not present

## 2016-05-17 DIAGNOSIS — M62562 Muscle wasting and atrophy, not elsewhere classified, left lower leg: Secondary | ICD-10-CM | POA: Diagnosis not present

## 2016-05-17 DIAGNOSIS — I69398 Other sequelae of cerebral infarction: Secondary | ICD-10-CM | POA: Diagnosis not present

## 2016-05-17 DIAGNOSIS — I69328 Other speech and language deficits following cerebral infarction: Secondary | ICD-10-CM | POA: Diagnosis not present

## 2016-05-17 DIAGNOSIS — M6281 Muscle weakness (generalized): Secondary | ICD-10-CM | POA: Diagnosis not present

## 2016-05-17 DIAGNOSIS — I481 Persistent atrial fibrillation: Secondary | ICD-10-CM | POA: Diagnosis not present

## 2016-05-17 NOTE — Telephone Encounter (Signed)
Left VM to call back 

## 2016-05-18 DIAGNOSIS — I69398 Other sequelae of cerebral infarction: Secondary | ICD-10-CM | POA: Diagnosis not present

## 2016-05-18 DIAGNOSIS — I69328 Other speech and language deficits following cerebral infarction: Secondary | ICD-10-CM | POA: Diagnosis not present

## 2016-05-18 DIAGNOSIS — M6281 Muscle weakness (generalized): Secondary | ICD-10-CM | POA: Diagnosis not present

## 2016-05-18 DIAGNOSIS — I481 Persistent atrial fibrillation: Secondary | ICD-10-CM | POA: Diagnosis not present

## 2016-05-18 DIAGNOSIS — M62562 Muscle wasting and atrophy, not elsewhere classified, left lower leg: Secondary | ICD-10-CM | POA: Diagnosis not present

## 2016-05-18 DIAGNOSIS — I10 Essential (primary) hypertension: Secondary | ICD-10-CM | POA: Diagnosis not present

## 2016-05-19 ENCOUNTER — Telehealth: Payer: Self-pay

## 2016-05-19 DIAGNOSIS — M62562 Muscle wasting and atrophy, not elsewhere classified, left lower leg: Secondary | ICD-10-CM | POA: Diagnosis not present

## 2016-05-19 DIAGNOSIS — I10 Essential (primary) hypertension: Secondary | ICD-10-CM | POA: Diagnosis not present

## 2016-05-19 DIAGNOSIS — I481 Persistent atrial fibrillation: Secondary | ICD-10-CM | POA: Diagnosis not present

## 2016-05-19 DIAGNOSIS — I69398 Other sequelae of cerebral infarction: Secondary | ICD-10-CM | POA: Diagnosis not present

## 2016-05-19 DIAGNOSIS — I69328 Other speech and language deficits following cerebral infarction: Secondary | ICD-10-CM | POA: Diagnosis not present

## 2016-05-19 DIAGNOSIS — M6281 Muscle weakness (generalized): Secondary | ICD-10-CM | POA: Diagnosis not present

## 2016-05-19 NOTE — Telephone Encounter (Signed)
Ron Parker- OT with [facility] calling to request orders to treat pt 1x/1w, 2x/4w 1x/1w. Also requesting SW eval for May 1st.   CB # [phone].

## 2016-05-21 NOTE — Telephone Encounter (Signed)
Ok but what is SW?

## 2016-05-22 ENCOUNTER — Telehealth: Payer: Self-pay | Admitting: Family Medicine

## 2016-05-22 DIAGNOSIS — I10 Essential (primary) hypertension: Secondary | ICD-10-CM | POA: Diagnosis not present

## 2016-05-22 DIAGNOSIS — I481 Persistent atrial fibrillation: Secondary | ICD-10-CM | POA: Diagnosis not present

## 2016-05-22 DIAGNOSIS — I69398 Other sequelae of cerebral infarction: Secondary | ICD-10-CM | POA: Diagnosis not present

## 2016-05-22 DIAGNOSIS — M6281 Muscle weakness (generalized): Secondary | ICD-10-CM | POA: Diagnosis not present

## 2016-05-22 DIAGNOSIS — I69328 Other speech and language deficits following cerebral infarction: Secondary | ICD-10-CM | POA: Diagnosis not present

## 2016-05-22 DIAGNOSIS — M62562 Muscle wasting and atrophy, not elsewhere classified, left lower leg: Secondary | ICD-10-CM | POA: Diagnosis not present

## 2016-05-22 NOTE — Telephone Encounter (Signed)
Byrd Hesselbach from [facility] wants the verbal orders for home PT one time for one week then 2 times a week for 8 weeks she can be reached at [phone]

## 2016-05-22 NOTE — Telephone Encounter (Signed)
ok 

## 2016-05-22 NOTE — Telephone Encounter (Signed)
Social work / Ron Parker informed okay to do

## 2016-05-22 NOTE — Telephone Encounter (Signed)
I have gave tiffany the okay for this this morning

## 2016-05-22 NOTE — Telephone Encounter (Signed)
Tiffany informed okay

## 2016-05-23 ENCOUNTER — Encounter: Payer: Self-pay | Admitting: Family Medicine

## 2016-05-23 ENCOUNTER — Ambulatory Visit (INDEPENDENT_AMBULATORY_CARE_PROVIDER_SITE_OTHER): Payer: Medicare Other | Admitting: Family Medicine

## 2016-05-23 VITALS — BP 100/50 | HR 74

## 2016-05-23 DIAGNOSIS — J209 Acute bronchitis, unspecified: Secondary | ICD-10-CM

## 2016-05-23 DIAGNOSIS — F0150 Vascular dementia without behavioral disturbance: Secondary | ICD-10-CM

## 2016-05-23 DIAGNOSIS — I69328 Other speech and language deficits following cerebral infarction: Secondary | ICD-10-CM | POA: Diagnosis not present

## 2016-05-23 DIAGNOSIS — I6930 Unspecified sequelae of cerebral infarction: Secondary | ICD-10-CM

## 2016-05-23 DIAGNOSIS — M62562 Muscle wasting and atrophy, not elsewhere classified, left lower leg: Secondary | ICD-10-CM | POA: Diagnosis not present

## 2016-05-23 DIAGNOSIS — I639 Cerebral infarction, unspecified: Secondary | ICD-10-CM

## 2016-05-23 DIAGNOSIS — I481 Persistent atrial fibrillation: Secondary | ICD-10-CM

## 2016-05-23 DIAGNOSIS — I69398 Other sequelae of cerebral infarction: Secondary | ICD-10-CM | POA: Diagnosis not present

## 2016-05-23 DIAGNOSIS — M6281 Muscle weakness (generalized): Secondary | ICD-10-CM | POA: Diagnosis not present

## 2016-05-23 DIAGNOSIS — I10 Essential (primary) hypertension: Secondary | ICD-10-CM | POA: Diagnosis not present

## 2016-05-23 DIAGNOSIS — I4819 Other persistent atrial fibrillation: Secondary | ICD-10-CM

## 2016-05-23 MED ORDER — AZITHROMYCIN 500 MG PO TABS: 500.0000 mg | ORAL_TABLET | Freq: Every day | ORAL | 0 refills | Status: DC

## 2016-05-23 NOTE — Patient Instructions (Signed)
Take the antibiotic and if there is any question in 7-10 days call okay. Use Claritin or Allegra for the runny nose

## 2016-05-23 NOTE — Progress Notes (Signed)
   Subjective:    Patient ID: Calvin Diaz, male    DOB: 1940-08-17, 76 y.o.   MRN: [id number]  HPI He is here for an interval evaluation. He was recently in a rehabilitation facility to help with getting his strength and stamina back. He has now been at home since April 22. He has helped bathing twice per week and also needs help with dressing because of his previous CVA. He is able to verbalize his needs but is not oriented to person place or time. He is using a walker and getting his strength. Over the last week he has had difficulty with cough and runny nose but no fever, chills, sore throat or earache.   Review of Systems     Objective:   Physical Exam Alert and in no distress, not oriented 2 he did know who he was. Tympanic membranes and canals are normal. Pharyngeal area is normal. Neck is supple without adenopathy or thyromegaly. Cardiac exam shows a regular sinus rhythm without murmurs or gallops. Lungs are clear to auscultation.        Assessment & Plan:  Acute bronchitis, unspecified organism - Plan: azithromycin (ZITHROMAX) 500 MG tablet  Persistent atrial fibrillation (HCC)  History of CVA with residual deficit  Vascular dementia without behavioral disturbance I think it would be prudent to treat him as if he has a bronchitis due to his overall physical health. Interesting that today his rate was regular. Encouraged him to be as physically active as possible. Apparently they will be bringing out a new walker and this should hopefully help keep him physically active. Apparently there is not enough money to get him to an adult daycare working have more interaction and physical activity. Recheck here in roughly 6 months.

## 2016-05-24 DIAGNOSIS — I69398 Other sequelae of cerebral infarction: Secondary | ICD-10-CM | POA: Diagnosis not present

## 2016-05-24 DIAGNOSIS — I69328 Other speech and language deficits following cerebral infarction: Secondary | ICD-10-CM | POA: Diagnosis not present

## 2016-05-24 DIAGNOSIS — I481 Persistent atrial fibrillation: Secondary | ICD-10-CM | POA: Diagnosis not present

## 2016-05-24 DIAGNOSIS — M6281 Muscle weakness (generalized): Secondary | ICD-10-CM | POA: Diagnosis not present

## 2016-05-24 DIAGNOSIS — M62562 Muscle wasting and atrophy, not elsewhere classified, left lower leg: Secondary | ICD-10-CM | POA: Diagnosis not present

## 2016-05-24 DIAGNOSIS — I10 Essential (primary) hypertension: Secondary | ICD-10-CM | POA: Diagnosis not present

## 2016-05-25 DIAGNOSIS — I69398 Other sequelae of cerebral infarction: Secondary | ICD-10-CM | POA: Diagnosis not present

## 2016-05-25 DIAGNOSIS — M6281 Muscle weakness (generalized): Secondary | ICD-10-CM | POA: Diagnosis not present

## 2016-05-25 DIAGNOSIS — I69328 Other speech and language deficits following cerebral infarction: Secondary | ICD-10-CM | POA: Diagnosis not present

## 2016-05-25 DIAGNOSIS — M62562 Muscle wasting and atrophy, not elsewhere classified, left lower leg: Secondary | ICD-10-CM | POA: Diagnosis not present

## 2016-05-25 DIAGNOSIS — I10 Essential (primary) hypertension: Secondary | ICD-10-CM | POA: Diagnosis not present

## 2016-05-25 DIAGNOSIS — I481 Persistent atrial fibrillation: Secondary | ICD-10-CM | POA: Diagnosis not present

## 2016-05-26 DIAGNOSIS — I10 Essential (primary) hypertension: Secondary | ICD-10-CM | POA: Diagnosis not present

## 2016-05-26 DIAGNOSIS — I481 Persistent atrial fibrillation: Secondary | ICD-10-CM | POA: Diagnosis not present

## 2016-05-26 DIAGNOSIS — M62562 Muscle wasting and atrophy, not elsewhere classified, left lower leg: Secondary | ICD-10-CM | POA: Diagnosis not present

## 2016-05-26 DIAGNOSIS — M6281 Muscle weakness (generalized): Secondary | ICD-10-CM | POA: Diagnosis not present

## 2016-05-26 DIAGNOSIS — I69328 Other speech and language deficits following cerebral infarction: Secondary | ICD-10-CM | POA: Diagnosis not present

## 2016-05-26 DIAGNOSIS — I69398 Other sequelae of cerebral infarction: Secondary | ICD-10-CM | POA: Diagnosis not present

## 2016-05-29 DIAGNOSIS — I69328 Other speech and language deficits following cerebral infarction: Secondary | ICD-10-CM | POA: Diagnosis not present

## 2016-05-29 DIAGNOSIS — I69398 Other sequelae of cerebral infarction: Secondary | ICD-10-CM | POA: Diagnosis not present

## 2016-05-29 DIAGNOSIS — I10 Essential (primary) hypertension: Secondary | ICD-10-CM | POA: Diagnosis not present

## 2016-05-29 DIAGNOSIS — M6281 Muscle weakness (generalized): Secondary | ICD-10-CM | POA: Diagnosis not present

## 2016-05-29 DIAGNOSIS — M62562 Muscle wasting and atrophy, not elsewhere classified, left lower leg: Secondary | ICD-10-CM | POA: Diagnosis not present

## 2016-05-29 DIAGNOSIS — I481 Persistent atrial fibrillation: Secondary | ICD-10-CM | POA: Diagnosis not present

## 2016-05-30 DIAGNOSIS — I69328 Other speech and language deficits following cerebral infarction: Secondary | ICD-10-CM | POA: Diagnosis not present

## 2016-05-30 DIAGNOSIS — M62562 Muscle wasting and atrophy, not elsewhere classified, left lower leg: Secondary | ICD-10-CM | POA: Diagnosis not present

## 2016-05-30 DIAGNOSIS — I10 Essential (primary) hypertension: Secondary | ICD-10-CM | POA: Diagnosis not present

## 2016-05-30 DIAGNOSIS — I69398 Other sequelae of cerebral infarction: Secondary | ICD-10-CM | POA: Diagnosis not present

## 2016-05-30 DIAGNOSIS — M6281 Muscle weakness (generalized): Secondary | ICD-10-CM | POA: Diagnosis not present

## 2016-05-30 DIAGNOSIS — I481 Persistent atrial fibrillation: Secondary | ICD-10-CM | POA: Diagnosis not present

## 2016-05-31 DIAGNOSIS — I69328 Other speech and language deficits following cerebral infarction: Secondary | ICD-10-CM | POA: Diagnosis not present

## 2016-05-31 DIAGNOSIS — I69398 Other sequelae of cerebral infarction: Secondary | ICD-10-CM | POA: Diagnosis not present

## 2016-05-31 DIAGNOSIS — I481 Persistent atrial fibrillation: Secondary | ICD-10-CM | POA: Diagnosis not present

## 2016-05-31 DIAGNOSIS — M6281 Muscle weakness (generalized): Secondary | ICD-10-CM | POA: Diagnosis not present

## 2016-05-31 DIAGNOSIS — I10 Essential (primary) hypertension: Secondary | ICD-10-CM | POA: Diagnosis not present

## 2016-05-31 DIAGNOSIS — M62562 Muscle wasting and atrophy, not elsewhere classified, left lower leg: Secondary | ICD-10-CM | POA: Diagnosis not present

## 2016-06-01 DIAGNOSIS — I481 Persistent atrial fibrillation: Secondary | ICD-10-CM | POA: Diagnosis not present

## 2016-06-01 DIAGNOSIS — I69398 Other sequelae of cerebral infarction: Secondary | ICD-10-CM | POA: Diagnosis not present

## 2016-06-01 DIAGNOSIS — I69328 Other speech and language deficits following cerebral infarction: Secondary | ICD-10-CM | POA: Diagnosis not present

## 2016-06-01 DIAGNOSIS — M6281 Muscle weakness (generalized): Secondary | ICD-10-CM | POA: Diagnosis not present

## 2016-06-01 DIAGNOSIS — M62562 Muscle wasting and atrophy, not elsewhere classified, left lower leg: Secondary | ICD-10-CM | POA: Diagnosis not present

## 2016-06-01 DIAGNOSIS — I10 Essential (primary) hypertension: Secondary | ICD-10-CM | POA: Diagnosis not present

## 2016-06-05 DIAGNOSIS — M6281 Muscle weakness (generalized): Secondary | ICD-10-CM | POA: Diagnosis not present

## 2016-06-05 DIAGNOSIS — M62562 Muscle wasting and atrophy, not elsewhere classified, left lower leg: Secondary | ICD-10-CM | POA: Diagnosis not present

## 2016-06-05 DIAGNOSIS — I481 Persistent atrial fibrillation: Secondary | ICD-10-CM | POA: Diagnosis not present

## 2016-06-05 DIAGNOSIS — I69328 Other speech and language deficits following cerebral infarction: Secondary | ICD-10-CM | POA: Diagnosis not present

## 2016-06-05 DIAGNOSIS — I10 Essential (primary) hypertension: Secondary | ICD-10-CM | POA: Diagnosis not present

## 2016-06-05 DIAGNOSIS — I69398 Other sequelae of cerebral infarction: Secondary | ICD-10-CM | POA: Diagnosis not present

## 2016-06-06 DIAGNOSIS — M6281 Muscle weakness (generalized): Secondary | ICD-10-CM | POA: Diagnosis not present

## 2016-06-06 DIAGNOSIS — I69398 Other sequelae of cerebral infarction: Secondary | ICD-10-CM | POA: Diagnosis not present

## 2016-06-06 DIAGNOSIS — M62562 Muscle wasting and atrophy, not elsewhere classified, left lower leg: Secondary | ICD-10-CM | POA: Diagnosis not present

## 2016-06-06 DIAGNOSIS — I481 Persistent atrial fibrillation: Secondary | ICD-10-CM | POA: Diagnosis not present

## 2016-06-06 DIAGNOSIS — I69328 Other speech and language deficits following cerebral infarction: Secondary | ICD-10-CM | POA: Diagnosis not present

## 2016-06-06 DIAGNOSIS — I10 Essential (primary) hypertension: Secondary | ICD-10-CM | POA: Diagnosis not present

## 2016-06-08 DIAGNOSIS — I481 Persistent atrial fibrillation: Secondary | ICD-10-CM | POA: Diagnosis not present

## 2016-06-08 DIAGNOSIS — I69398 Other sequelae of cerebral infarction: Secondary | ICD-10-CM | POA: Diagnosis not present

## 2016-06-08 DIAGNOSIS — I69328 Other speech and language deficits following cerebral infarction: Secondary | ICD-10-CM | POA: Diagnosis not present

## 2016-06-08 DIAGNOSIS — M62562 Muscle wasting and atrophy, not elsewhere classified, left lower leg: Secondary | ICD-10-CM | POA: Diagnosis not present

## 2016-06-08 DIAGNOSIS — M6281 Muscle weakness (generalized): Secondary | ICD-10-CM | POA: Diagnosis not present

## 2016-06-08 DIAGNOSIS — I10 Essential (primary) hypertension: Secondary | ICD-10-CM | POA: Diagnosis not present

## 2016-06-09 DIAGNOSIS — I69328 Other speech and language deficits following cerebral infarction: Secondary | ICD-10-CM | POA: Diagnosis not present

## 2016-06-09 DIAGNOSIS — M6281 Muscle weakness (generalized): Secondary | ICD-10-CM | POA: Diagnosis not present

## 2016-06-09 DIAGNOSIS — I10 Essential (primary) hypertension: Secondary | ICD-10-CM | POA: Diagnosis not present

## 2016-06-09 DIAGNOSIS — M62562 Muscle wasting and atrophy, not elsewhere classified, left lower leg: Secondary | ICD-10-CM | POA: Diagnosis not present

## 2016-06-09 DIAGNOSIS — I481 Persistent atrial fibrillation: Secondary | ICD-10-CM | POA: Diagnosis not present

## 2016-06-09 DIAGNOSIS — I69398 Other sequelae of cerebral infarction: Secondary | ICD-10-CM | POA: Diagnosis not present

## 2016-06-12 DIAGNOSIS — I10 Essential (primary) hypertension: Secondary | ICD-10-CM | POA: Diagnosis not present

## 2016-06-12 DIAGNOSIS — M6281 Muscle weakness (generalized): Secondary | ICD-10-CM | POA: Diagnosis not present

## 2016-06-12 DIAGNOSIS — M62562 Muscle wasting and atrophy, not elsewhere classified, left lower leg: Secondary | ICD-10-CM | POA: Diagnosis not present

## 2016-06-12 DIAGNOSIS — I69398 Other sequelae of cerebral infarction: Secondary | ICD-10-CM | POA: Diagnosis not present

## 2016-06-12 DIAGNOSIS — I481 Persistent atrial fibrillation: Secondary | ICD-10-CM | POA: Diagnosis not present

## 2016-06-12 DIAGNOSIS — I69328 Other speech and language deficits following cerebral infarction: Secondary | ICD-10-CM | POA: Diagnosis not present

## 2016-06-13 ENCOUNTER — Telehealth: Payer: Self-pay

## 2016-06-13 ENCOUNTER — Other Ambulatory Visit: Payer: Self-pay | Admitting: Nurse Practitioner

## 2016-06-13 DIAGNOSIS — M6281 Muscle weakness (generalized): Secondary | ICD-10-CM | POA: Diagnosis not present

## 2016-06-13 DIAGNOSIS — I69354 Hemiplegia and hemiparesis following cerebral infarction affecting left non-dominant side: Secondary | ICD-10-CM

## 2016-06-13 DIAGNOSIS — I69398 Other sequelae of cerebral infarction: Secondary | ICD-10-CM | POA: Diagnosis not present

## 2016-06-13 DIAGNOSIS — I10 Essential (primary) hypertension: Secondary | ICD-10-CM | POA: Diagnosis not present

## 2016-06-13 DIAGNOSIS — M62562 Muscle wasting and atrophy, not elsewhere classified, left lower leg: Secondary | ICD-10-CM | POA: Diagnosis not present

## 2016-06-13 DIAGNOSIS — I69328 Other speech and language deficits following cerebral infarction: Secondary | ICD-10-CM | POA: Diagnosis not present

## 2016-06-13 DIAGNOSIS — I481 Persistent atrial fibrillation: Secondary | ICD-10-CM | POA: Diagnosis not present

## 2016-06-13 MED ORDER — COLCHICINE 0.6 MG PO TABS: 0.6000 mg | ORAL_TABLET | Freq: Every day | ORAL | 5 refills | Status: AC

## 2016-06-13 MED ORDER — CLOPIDOGREL BISULFATE 75 MG PO TABS: 75.0000 mg | ORAL_TABLET | Freq: Every day | ORAL | 5 refills | Status: DC

## 2016-06-13 MED ORDER — APIXABAN 5 MG PO TABS: 5.0000 mg | ORAL_TABLET | Freq: Two times a day (BID) | ORAL | 5 refills | Status: DC

## 2016-06-13 MED ORDER — CITALOPRAM HYDROBROMIDE 20 MG PO TABS: 20.0000 mg | ORAL_TABLET | Freq: Every day | ORAL | 5 refills | Status: AC

## 2016-06-13 MED ORDER — MEMANTINE HCL 5 MG PO TABS: 10.0000 mg | ORAL_TABLET | Freq: Every day | ORAL | 5 refills | Status: AC

## 2016-06-13 NOTE — Telephone Encounter (Signed)
Pt needs refill of citalopram, cholchicine, eliquis, plavix, memantine [facility] [address]

## 2016-06-14 DIAGNOSIS — I481 Persistent atrial fibrillation: Secondary | ICD-10-CM | POA: Diagnosis not present

## 2016-06-14 DIAGNOSIS — M62562 Muscle wasting and atrophy, not elsewhere classified, left lower leg: Secondary | ICD-10-CM | POA: Diagnosis not present

## 2016-06-14 DIAGNOSIS — M6281 Muscle weakness (generalized): Secondary | ICD-10-CM | POA: Diagnosis not present

## 2016-06-14 DIAGNOSIS — I10 Essential (primary) hypertension: Secondary | ICD-10-CM | POA: Diagnosis not present

## 2016-06-14 DIAGNOSIS — I69328 Other speech and language deficits following cerebral infarction: Secondary | ICD-10-CM | POA: Diagnosis not present

## 2016-06-14 DIAGNOSIS — I69398 Other sequelae of cerebral infarction: Secondary | ICD-10-CM | POA: Diagnosis not present

## 2016-06-15 DIAGNOSIS — I69398 Other sequelae of cerebral infarction: Secondary | ICD-10-CM | POA: Diagnosis not present

## 2016-06-15 DIAGNOSIS — I10 Essential (primary) hypertension: Secondary | ICD-10-CM | POA: Diagnosis not present

## 2016-06-15 DIAGNOSIS — M6281 Muscle weakness (generalized): Secondary | ICD-10-CM | POA: Diagnosis not present

## 2016-06-15 DIAGNOSIS — M62562 Muscle wasting and atrophy, not elsewhere classified, left lower leg: Secondary | ICD-10-CM | POA: Diagnosis not present

## 2016-06-15 DIAGNOSIS — I69328 Other speech and language deficits following cerebral infarction: Secondary | ICD-10-CM | POA: Diagnosis not present

## 2016-06-15 DIAGNOSIS — I481 Persistent atrial fibrillation: Secondary | ICD-10-CM | POA: Diagnosis not present

## 2016-06-16 DIAGNOSIS — I10 Essential (primary) hypertension: Secondary | ICD-10-CM | POA: Diagnosis not present

## 2016-06-16 DIAGNOSIS — I69328 Other speech and language deficits following cerebral infarction: Secondary | ICD-10-CM | POA: Diagnosis not present

## 2016-06-16 DIAGNOSIS — I481 Persistent atrial fibrillation: Secondary | ICD-10-CM | POA: Diagnosis not present

## 2016-06-16 DIAGNOSIS — M62562 Muscle wasting and atrophy, not elsewhere classified, left lower leg: Secondary | ICD-10-CM | POA: Diagnosis not present

## 2016-06-16 DIAGNOSIS — M6281 Muscle weakness (generalized): Secondary | ICD-10-CM | POA: Diagnosis not present

## 2016-06-16 DIAGNOSIS — I69398 Other sequelae of cerebral infarction: Secondary | ICD-10-CM | POA: Diagnosis not present

## 2016-06-20 DIAGNOSIS — I69328 Other speech and language deficits following cerebral infarction: Secondary | ICD-10-CM | POA: Diagnosis not present

## 2016-06-20 DIAGNOSIS — M6281 Muscle weakness (generalized): Secondary | ICD-10-CM | POA: Diagnosis not present

## 2016-06-20 DIAGNOSIS — I481 Persistent atrial fibrillation: Secondary | ICD-10-CM | POA: Diagnosis not present

## 2016-06-20 DIAGNOSIS — I10 Essential (primary) hypertension: Secondary | ICD-10-CM | POA: Diagnosis not present

## 2016-06-20 DIAGNOSIS — I69398 Other sequelae of cerebral infarction: Secondary | ICD-10-CM | POA: Diagnosis not present

## 2016-06-20 DIAGNOSIS — M62562 Muscle wasting and atrophy, not elsewhere classified, left lower leg: Secondary | ICD-10-CM | POA: Diagnosis not present

## 2016-06-21 DIAGNOSIS — I69328 Other speech and language deficits following cerebral infarction: Secondary | ICD-10-CM | POA: Diagnosis not present

## 2016-06-21 DIAGNOSIS — I10 Essential (primary) hypertension: Secondary | ICD-10-CM | POA: Diagnosis not present

## 2016-06-21 DIAGNOSIS — I69398 Other sequelae of cerebral infarction: Secondary | ICD-10-CM | POA: Diagnosis not present

## 2016-06-21 DIAGNOSIS — M62562 Muscle wasting and atrophy, not elsewhere classified, left lower leg: Secondary | ICD-10-CM | POA: Diagnosis not present

## 2016-06-21 DIAGNOSIS — I481 Persistent atrial fibrillation: Secondary | ICD-10-CM | POA: Diagnosis not present

## 2016-06-21 DIAGNOSIS — M6281 Muscle weakness (generalized): Secondary | ICD-10-CM | POA: Diagnosis not present

## 2016-06-22 DIAGNOSIS — M62562 Muscle wasting and atrophy, not elsewhere classified, left lower leg: Secondary | ICD-10-CM | POA: Diagnosis not present

## 2016-06-22 DIAGNOSIS — I69328 Other speech and language deficits following cerebral infarction: Secondary | ICD-10-CM | POA: Diagnosis not present

## 2016-06-22 DIAGNOSIS — I10 Essential (primary) hypertension: Secondary | ICD-10-CM | POA: Diagnosis not present

## 2016-06-22 DIAGNOSIS — I481 Persistent atrial fibrillation: Secondary | ICD-10-CM | POA: Diagnosis not present

## 2016-06-22 DIAGNOSIS — I69398 Other sequelae of cerebral infarction: Secondary | ICD-10-CM | POA: Diagnosis not present

## 2016-06-22 DIAGNOSIS — M6281 Muscle weakness (generalized): Secondary | ICD-10-CM | POA: Diagnosis not present

## 2016-06-23 DIAGNOSIS — I481 Persistent atrial fibrillation: Secondary | ICD-10-CM | POA: Diagnosis not present

## 2016-06-23 DIAGNOSIS — I69328 Other speech and language deficits following cerebral infarction: Secondary | ICD-10-CM | POA: Diagnosis not present

## 2016-06-23 DIAGNOSIS — M6281 Muscle weakness (generalized): Secondary | ICD-10-CM | POA: Diagnosis not present

## 2016-06-23 DIAGNOSIS — M62562 Muscle wasting and atrophy, not elsewhere classified, left lower leg: Secondary | ICD-10-CM | POA: Diagnosis not present

## 2016-06-23 DIAGNOSIS — I69398 Other sequelae of cerebral infarction: Secondary | ICD-10-CM | POA: Diagnosis not present

## 2016-06-23 DIAGNOSIS — I10 Essential (primary) hypertension: Secondary | ICD-10-CM | POA: Diagnosis not present

## 2016-06-27 DIAGNOSIS — I69328 Other speech and language deficits following cerebral infarction: Secondary | ICD-10-CM | POA: Diagnosis not present

## 2016-06-27 DIAGNOSIS — I10 Essential (primary) hypertension: Secondary | ICD-10-CM | POA: Diagnosis not present

## 2016-06-27 DIAGNOSIS — M6281 Muscle weakness (generalized): Secondary | ICD-10-CM | POA: Diagnosis not present

## 2016-06-27 DIAGNOSIS — I69398 Other sequelae of cerebral infarction: Secondary | ICD-10-CM | POA: Diagnosis not present

## 2016-06-27 DIAGNOSIS — I481 Persistent atrial fibrillation: Secondary | ICD-10-CM | POA: Diagnosis not present

## 2016-06-27 DIAGNOSIS — M62562 Muscle wasting and atrophy, not elsewhere classified, left lower leg: Secondary | ICD-10-CM | POA: Diagnosis not present

## 2016-06-29 DIAGNOSIS — I481 Persistent atrial fibrillation: Secondary | ICD-10-CM | POA: Diagnosis not present

## 2016-06-29 DIAGNOSIS — M62562 Muscle wasting and atrophy, not elsewhere classified, left lower leg: Secondary | ICD-10-CM | POA: Diagnosis not present

## 2016-06-29 DIAGNOSIS — I10 Essential (primary) hypertension: Secondary | ICD-10-CM | POA: Diagnosis not present

## 2016-06-29 DIAGNOSIS — I69398 Other sequelae of cerebral infarction: Secondary | ICD-10-CM | POA: Diagnosis not present

## 2016-06-29 DIAGNOSIS — M6281 Muscle weakness (generalized): Secondary | ICD-10-CM | POA: Diagnosis not present

## 2016-06-29 DIAGNOSIS — I69328 Other speech and language deficits following cerebral infarction: Secondary | ICD-10-CM | POA: Diagnosis not present

## 2016-07-03 DIAGNOSIS — I481 Persistent atrial fibrillation: Secondary | ICD-10-CM | POA: Diagnosis not present

## 2016-07-03 DIAGNOSIS — I69328 Other speech and language deficits following cerebral infarction: Secondary | ICD-10-CM | POA: Diagnosis not present

## 2016-07-03 DIAGNOSIS — M62562 Muscle wasting and atrophy, not elsewhere classified, left lower leg: Secondary | ICD-10-CM | POA: Diagnosis not present

## 2016-07-03 DIAGNOSIS — I10 Essential (primary) hypertension: Secondary | ICD-10-CM | POA: Diagnosis not present

## 2016-07-03 DIAGNOSIS — M6281 Muscle weakness (generalized): Secondary | ICD-10-CM | POA: Diagnosis not present

## 2016-07-03 DIAGNOSIS — I69398 Other sequelae of cerebral infarction: Secondary | ICD-10-CM | POA: Diagnosis not present

## 2016-07-04 DIAGNOSIS — M6281 Muscle weakness (generalized): Secondary | ICD-10-CM | POA: Diagnosis not present

## 2016-07-04 DIAGNOSIS — I481 Persistent atrial fibrillation: Secondary | ICD-10-CM | POA: Diagnosis not present

## 2016-07-04 DIAGNOSIS — M62562 Muscle wasting and atrophy, not elsewhere classified, left lower leg: Secondary | ICD-10-CM | POA: Diagnosis not present

## 2016-07-04 DIAGNOSIS — I69398 Other sequelae of cerebral infarction: Secondary | ICD-10-CM | POA: Diagnosis not present

## 2016-07-04 DIAGNOSIS — I10 Essential (primary) hypertension: Secondary | ICD-10-CM | POA: Diagnosis not present

## 2016-07-04 DIAGNOSIS — I69328 Other speech and language deficits following cerebral infarction: Secondary | ICD-10-CM | POA: Diagnosis not present

## 2016-07-06 DIAGNOSIS — I69398 Other sequelae of cerebral infarction: Secondary | ICD-10-CM | POA: Diagnosis not present

## 2016-07-06 DIAGNOSIS — M6281 Muscle weakness (generalized): Secondary | ICD-10-CM | POA: Diagnosis not present

## 2016-07-06 DIAGNOSIS — M62562 Muscle wasting and atrophy, not elsewhere classified, left lower leg: Secondary | ICD-10-CM | POA: Diagnosis not present

## 2016-07-06 DIAGNOSIS — I481 Persistent atrial fibrillation: Secondary | ICD-10-CM | POA: Diagnosis not present

## 2016-07-06 DIAGNOSIS — I69328 Other speech and language deficits following cerebral infarction: Secondary | ICD-10-CM | POA: Diagnosis not present

## 2016-07-06 DIAGNOSIS — I10 Essential (primary) hypertension: Secondary | ICD-10-CM | POA: Diagnosis not present

## 2016-07-10 DIAGNOSIS — I10 Essential (primary) hypertension: Secondary | ICD-10-CM | POA: Diagnosis not present

## 2016-07-10 DIAGNOSIS — I69328 Other speech and language deficits following cerebral infarction: Secondary | ICD-10-CM | POA: Diagnosis not present

## 2016-07-10 DIAGNOSIS — I481 Persistent atrial fibrillation: Secondary | ICD-10-CM | POA: Diagnosis not present

## 2016-07-10 DIAGNOSIS — I69398 Other sequelae of cerebral infarction: Secondary | ICD-10-CM | POA: Diagnosis not present

## 2016-07-10 DIAGNOSIS — M62562 Muscle wasting and atrophy, not elsewhere classified, left lower leg: Secondary | ICD-10-CM | POA: Diagnosis not present

## 2016-07-10 DIAGNOSIS — M6281 Muscle weakness (generalized): Secondary | ICD-10-CM | POA: Diagnosis not present

## 2016-07-12 ENCOUNTER — Ambulatory Visit: Payer: Medicare Other | Admitting: Cardiovascular Disease

## 2016-07-12 DIAGNOSIS — I481 Persistent atrial fibrillation: Secondary | ICD-10-CM | POA: Diagnosis not present

## 2016-07-12 DIAGNOSIS — M62562 Muscle wasting and atrophy, not elsewhere classified, left lower leg: Secondary | ICD-10-CM | POA: Diagnosis not present

## 2016-07-12 DIAGNOSIS — M6281 Muscle weakness (generalized): Secondary | ICD-10-CM | POA: Diagnosis not present

## 2016-07-12 DIAGNOSIS — I69328 Other speech and language deficits following cerebral infarction: Secondary | ICD-10-CM | POA: Diagnosis not present

## 2016-07-12 DIAGNOSIS — I69398 Other sequelae of cerebral infarction: Secondary | ICD-10-CM | POA: Diagnosis not present

## 2016-07-12 DIAGNOSIS — I10 Essential (primary) hypertension: Secondary | ICD-10-CM | POA: Diagnosis not present

## 2016-07-13 DIAGNOSIS — I481 Persistent atrial fibrillation: Secondary | ICD-10-CM | POA: Diagnosis not present

## 2016-07-13 DIAGNOSIS — I69328 Other speech and language deficits following cerebral infarction: Secondary | ICD-10-CM | POA: Diagnosis not present

## 2016-07-13 DIAGNOSIS — I10 Essential (primary) hypertension: Secondary | ICD-10-CM | POA: Diagnosis not present

## 2016-07-13 DIAGNOSIS — M6281 Muscle weakness (generalized): Secondary | ICD-10-CM | POA: Diagnosis not present

## 2016-07-13 DIAGNOSIS — M62562 Muscle wasting and atrophy, not elsewhere classified, left lower leg: Secondary | ICD-10-CM | POA: Diagnosis not present

## 2016-07-13 DIAGNOSIS — I69398 Other sequelae of cerebral infarction: Secondary | ICD-10-CM | POA: Diagnosis not present

## 2016-07-24 ENCOUNTER — Emergency Department (HOSPITAL_COMMUNITY)
Admission: EM | Admit: 2016-07-24 | Discharge: 2016-07-24 | Disposition: A | Discharge status: Home / Self Care | Payer: Medicare Other | Attending: Internal Medicine | Admitting: Internal Medicine

## 2016-07-24 ENCOUNTER — Telehealth: Payer: Self-pay | Admitting: Family Medicine

## 2016-07-24 ENCOUNTER — Emergency Department (HOSPITAL_COMMUNITY): Payer: Medicare Other

## 2016-07-24 ENCOUNTER — Encounter (HOSPITAL_COMMUNITY): Payer: Self-pay | Admitting: Emergency Medicine

## 2016-07-24 DIAGNOSIS — E7800 Pure hypercholesterolemia, unspecified: Secondary | ICD-10-CM | POA: Insufficient documentation

## 2016-07-24 DIAGNOSIS — F0390 Unspecified dementia without behavioral disturbance: Secondary | ICD-10-CM | POA: Insufficient documentation

## 2016-07-24 DIAGNOSIS — I4891 Unspecified atrial fibrillation: Secondary | ICD-10-CM | POA: Insufficient documentation

## 2016-07-24 DIAGNOSIS — Z87891 Personal history of nicotine dependence: Secondary | ICD-10-CM | POA: Diagnosis not present

## 2016-07-24 DIAGNOSIS — Y92009 Unspecified place in unspecified non-institutional (private) residence as the place of occurrence of the external cause: Secondary | ICD-10-CM | POA: Diagnosis not present

## 2016-07-24 DIAGNOSIS — Y998 Other external cause status: Secondary | ICD-10-CM | POA: Diagnosis not present

## 2016-07-24 DIAGNOSIS — Y33XXXA Other specified events, undetermined intent, initial encounter: Secondary | ICD-10-CM | POA: Insufficient documentation

## 2016-07-24 DIAGNOSIS — R4182 Altered mental status, unspecified: Secondary | ICD-10-CM | POA: Diagnosis not present

## 2016-07-24 DIAGNOSIS — S0990XA Unspecified injury of head, initial encounter: Secondary | ICD-10-CM | POA: Insufficient documentation

## 2016-07-24 DIAGNOSIS — I10 Essential (primary) hypertension: Secondary | ICD-10-CM | POA: Diagnosis not present

## 2016-07-24 DIAGNOSIS — Z8673 Personal history of transient ischemic attack (TIA), and cerebral infarction without residual deficits: Secondary | ICD-10-CM | POA: Insufficient documentation

## 2016-07-24 DIAGNOSIS — Y939 Activity, unspecified: Secondary | ICD-10-CM | POA: Insufficient documentation

## 2016-07-24 DIAGNOSIS — G839 Paralytic syndrome, unspecified: Secondary | ICD-10-CM | POA: Diagnosis not present

## 2016-07-24 DIAGNOSIS — I252 Old myocardial infarction: Secondary | ICD-10-CM | POA: Diagnosis not present

## 2016-07-24 DIAGNOSIS — I6789 Other cerebrovascular disease: Secondary | ICD-10-CM | POA: Diagnosis not present

## 2016-07-24 NOTE — Telephone Encounter (Signed)
Pt's wife called and stated that it's time to place him in a nursing home. He is starting to wander off out of the house. She is asking for assistance. She can be reached at [phone].

## 2016-07-24 NOTE — Telephone Encounter (Signed)
Wife informed to talk to admin at nursing home to get  With social worker

## 2016-07-24 NOTE — Discharge Instructions (Signed)
Return here as needed.  Follow-up with your primary care doctor °

## 2016-07-24 NOTE — ED Notes (Signed)
Patient transported to CT 

## 2016-07-24 NOTE — Telephone Encounter (Signed)
I assume that we will need to get social services to help with this

## 2016-07-24 NOTE — ED Notes (Signed)
Spouse & brother at bedside at this time. Spouse states that when she awoke to loud noise, she "jumped out of bed and ran to the living room. He was trying to get out of the door." Now states that the noise may have been patient attempting to open door - instead of stating that he fell. Spouse reports that she wants patient placed in nursing facility. Patient had Home Health, but was discharged on Friday. Social Work did not place patient.

## 2016-07-24 NOTE — ED Triage Notes (Addendum)
Patient arrived to ED via [facility] from home. EMS reports: Spouse reported that approx 0500 she heard a "loud thud" in the living room. Went to check on patient. Patient walking around in living room. Called EMS. Patient denies fall. No injuries noted.  Patient has mild dementia, L sided weakness/deficit d/t prior CVA. Patient taking Eliquis. BP 160/90, Pulse 70, Pulse ox 100%. CBG 150. Spouse reports patient is oriented to his baseline.

## 2016-07-26 NOTE — ED Provider Notes (Signed)
[facility] Provider Note   CSN: [id number] Arrival date & time:        History   Chief Complaint Chief Complaint  Patient presents with  . Fall    HPI Calvin Diaz is a 76 y.o. male.  HPI Patient presents to the emergency department with a possible fall.  The patient states that he did not fall.  He states that he was attempting to go outside and is having a hard time getting the door open, the wife states that she went into the living room when she heard the loud bang and the patient was standing, walking around the room.  Patient states he was attempting to go outside to get a flashlight out of his car.  The wife states that the loud bang could have been the front door, hitting the wall. The patient denies chest pain, shortness of breath, headache,blurred vision, neck pain, fever, cough, weakness, numbness, dizziness, anorexia, edema, abdominal pain, nausea, vomiting, diarrhea, rash, back pain, dysuria, hematemesis, bloody stool, near syncope, or syncope. Past Medical History:  Diagnosis Date  . Dementia   . High cholesterol   . Hypertension   . Myocardial infarction (HCC)   . Stroke [facility](HCC)     Patient Active Problem List   Diagnosis Date Noted  . Seizures (HCC) 03/31/2016  . Elbow swelling, left   . Delirium   . Dysphagia, post-stroke   . Tachycardia   . Leukocytosis   . Polyarticular arthritis   . Hypokalemia 12/14/2015  . OAB (overactive bladder) 09/20/2015  . Essential hypertension 09/07/2015  . Atrial fibrillation (HCC) 09/07/2015  . Dementia 09/07/2015  . History of CVA with residual deficit 08/18/2015  . Former smoker 08/18/2015  . History of alcohol abuse 08/18/2015  . Hyperlipidemia 08/18/2015    Past Surgical History:  Procedure Laterality Date  . CORONARY ANGIOPLASTY         Home Medications    Prior to Admission medications   Medication Sig Start Date End Date Taking? Authorizing Provider  apixaban (ELIQUIS) 5 MG TABS tablet  Take 1 tablet (5 mg total) by mouth 2 (two) times daily. 06/13/16   Ronnald NianLalonde, John C, MD  azithromycin (ZITHROMAX) 500 MG tablet Take 1 tablet (500 mg total) by mouth daily. 05/23/16   Ronnald NianLalonde, John C, MD  citalopram (CELEXA) 20 MG tablet Take 1 tablet (20 mg total) by mouth daily. 06/13/16   Ronnald NianLalonde, John C, MD  clopidogrel (PLAVIX) 75 MG tablet Take 1 tablet (75 mg total) by mouth daily. 06/13/16   Ronnald NianLalonde, John C, MD  colchicine 0.6 MG tablet Take 1 tablet (0.6 mg total) by mouth daily. 06/13/16   Ronnald NianLalonde, John C, MD  divalproex (DEPAKOTE SPRINKLE) 125 MG capsule Take 1 capsule (125 mg total) by mouth 4 (four) times daily. 05/10/16   Sharon SellerEubanks, Jessica K, NP  memantine (NAMENDA) 5 MG tablet Take 2 tablets (10 mg total) by mouth daily. 06/13/16   Ronnald NianLalonde, John C, MD  Multiple Vitamin (MULTIVITAMIN WITH MINERALS) TABS tablet Take 1 tablet by mouth daily.    [provider]  pravastatin (PRAVACHOL) 80 MG tablet Take 1 tablet (80 mg total) by mouth at bedtime. 05/10/16   Sharon SellerEubanks, Jessica K, NP  terazosin (HYTRIN) 1 MG capsule Take 1 capsule (1 mg total) by mouth at bedtime. 05/10/16   Sharon SellerEubanks, Jessica K, NP    Family History Family History  Problem Relation Age of Onset  . Arrhythmia Mother        Had  pacemaker  . Stroke Mother   . Stroke Maternal Aunt   . Heart attack Father     Social History Social History  Substance Use Topics  . Smoking status: Former Smoker    Types: Cigarettes  . Smokeless tobacco: Former Neurosurgeon    Quit date: 08/18/2015  . Alcohol use No     Comment: Previously daily     Allergies   Patient has no known allergies.   Review of Systems Review of Systems  All other systems negative except as documented in the HPI. All pertinent positives and negatives as reviewed in the HPI. Physical Exam Updated Vital Signs BP (!) 148/79   Pulse 64   Temp 98.2 F (36.8 C) (Oral)   Resp 16   Ht 5\' 11"  (1.803 m)   Wt 65.8 kg (145 lb)   SpO2 99%   BMI 20.22 kg/m    Physical Exam  Constitutional: He is oriented to person, place, and time. He appears well-developed and well-nourished. No distress.  HENT:  Head: Normocephalic and atraumatic.  Mouth/Throat: Oropharynx is clear and moist.  Eyes: Pupils are equal, round, and reactive to light.  Neck: Normal range of motion. Neck supple.  Cardiovascular: Normal rate, regular rhythm and normal heart sounds.  Exam reveals no gallop and no friction rub.   No murmur heard. Pulmonary/Chest: Effort normal and breath sounds normal. No respiratory distress. He has no wheezes.  Neurological: He is alert and oriented to person, place, and time. He exhibits normal muscle tone. Coordination normal.  Skin: Skin is warm and dry. Capillary refill takes less than 2 seconds. No rash noted. No erythema.  Psychiatric: He has a normal mood and affect. His behavior is normal.  Nursing note and vitals reviewed.    ED Treatments / Results  Labs (all labs ordered are listed, but only abnormal results are displayed) Labs Reviewed - No data to display  EKG  EKG Interpretation None       Radiology No results found.  Procedures Procedures (including critical care time)  Medications Ordered in ED Medications - No data to display   Initial Impression / Assessment and Plan / ED Course  I have reviewed the triage vital signs and the nursing notes.  Pertinent labs & imaging results that were available during my care of the patient were reviewed by me and considered in my medical decision making (see chart for details).     A head CT was obtained due to the fact that the patient does have some mild dementia and this shows to ensure there was no injury.  Patient has been stable here in the emergency department without any issues.  Patient be discharged home to follow up with his primary doctor.  Patient and wife agree with plan and all questions were answered  Final Clinical Impressions(s) / ED Diagnoses   Final  diagnoses:  Minor head injury, initial encounter    New Prescriptions Discharge Medication List as of 07/24/2016 12:02 PM       Charlestine Night, PA-C 07/26/16 1522    Jacalyn Lefevre, MD 07/26/16 [phone]

## 2016-07-28 ENCOUNTER — Telehealth: Payer: Self-pay | Admitting: Family Medicine

## 2016-07-28 NOTE — Telephone Encounter (Signed)
Scheduled appointment so we can fill out that form

## 2016-07-28 NOTE — Telephone Encounter (Signed)
Pt's wife, [name], called and stated that she needs a FL2 form faxed to [facility]. I have pulled form and am sending back to be completed.  When complete please fax to:  [facility] Attn: Aurther LoftKendra Hines Fax # [phone]   Pt's wife Calvin Diaz can be reached at [phone]

## 2016-07-28 NOTE — Telephone Encounter (Signed)
Pt has appointment Monday

## 2016-07-31 ENCOUNTER — Ambulatory Visit (INDEPENDENT_AMBULATORY_CARE_PROVIDER_SITE_OTHER): Payer: Medicare Other | Admitting: Family Medicine

## 2016-07-31 ENCOUNTER — Encounter: Payer: Self-pay | Admitting: Family Medicine

## 2016-07-31 VITALS — BP 140/82 | HR 80

## 2016-07-31 DIAGNOSIS — R569 Unspecified convulsions: Secondary | ICD-10-CM | POA: Diagnosis not present

## 2016-07-31 DIAGNOSIS — E7800 Pure hypercholesterolemia, unspecified: Secondary | ICD-10-CM | POA: Diagnosis not present

## 2016-07-31 DIAGNOSIS — I639 Cerebral infarction, unspecified: Secondary | ICD-10-CM | POA: Diagnosis not present

## 2016-07-31 DIAGNOSIS — I4819 Other persistent atrial fibrillation: Secondary | ICD-10-CM

## 2016-07-31 DIAGNOSIS — F0150 Vascular dementia without behavioral disturbance: Secondary | ICD-10-CM

## 2016-07-31 DIAGNOSIS — I481 Persistent atrial fibrillation: Secondary | ICD-10-CM | POA: Diagnosis not present

## 2016-07-31 DIAGNOSIS — M109 Gout, unspecified: Secondary | ICD-10-CM

## 2016-07-31 NOTE — Progress Notes (Signed)
   Subjective:    Patient ID: Calvin HawkingJames Ressel, male    DOB: [birth year], 76 y.o.   MRN: [id number]  HPI He is here with his wife. She has underlying COPD and presently is on oxygen. This makes it difficult for her to take care of her husband. He does have difficulty with his dementia causing him to be disoriented, occasionally being verbally abusive. He does need help with bathing and with dressing. He is ambulatory however does use a cane to get around. He is also bowel and bladder incontinent and is wearing diapers. His eating habits are normal. He continues on medications listed in the chart. She does need an F L2 form filled out.   Review of Systems     Objective:   Physical Exam Alert and oriented 3. Dressed appropriately.       Assessment & Plan:  Persistent atrial fibrillation (HCC)  Vascular dementia without behavioral disturbance  Seizures (HCC)  Gout, unspecified cause, unspecified chronicity, unspecified site  Pure hypercholesterolemia The appropriate FL 2 forms were filled out and are in the record.

## 2016-08-11 ENCOUNTER — Telehealth: Payer: Self-pay | Admitting: Family Medicine

## 2016-08-11 NOTE — Telephone Encounter (Signed)
Patient's wife called to let you know that he did not go into nursing home due to $$. She wants to know if she can get help thru Hospice or something else  Please call

## 2016-08-11 NOTE — Telephone Encounter (Signed)
See if he qualifies through [facility]. The only other thing is to get a hold when the home health agencies. Explained that hospice is only end-of-life care.

## 2016-08-14 ENCOUNTER — Telehealth: Payer: Self-pay

## 2016-08-14 DIAGNOSIS — F0150 Vascular dementia without behavioral disturbance: Secondary | ICD-10-CM

## 2016-08-14 DIAGNOSIS — I6930 Unspecified sequelae of cerebral infarction: Secondary | ICD-10-CM

## 2016-08-14 DIAGNOSIS — I4819 Other persistent atrial fibrillation: Secondary | ICD-10-CM

## 2016-08-14 NOTE — Telephone Encounter (Signed)
Left message advising Calvin Diaz that we can order home health to help with the patient at home.  Advised her to call back with what she would like to do or discuss other options.

## 2016-08-14 NOTE — Telephone Encounter (Signed)
Patient wife would like to go forward with home health.  Referral placed to [facility].

## 2016-08-17 ENCOUNTER — Telehealth: Payer: Self-pay | Admitting: Family Medicine

## 2016-08-17 NOTE — Telephone Encounter (Signed)
Home health was informed today the floater that was here didn't set this done

## 2016-08-17 NOTE — Telephone Encounter (Signed)
Pt's wife, [name], called stating she has not heard from home health yet and wants to report that info to [facility].

## 2016-08-18 ENCOUNTER — Ambulatory Visit (INDEPENDENT_AMBULATORY_CARE_PROVIDER_SITE_OTHER): Payer: Medicare Other | Admitting: Cardiovascular Disease

## 2016-08-18 ENCOUNTER — Encounter: Payer: Self-pay | Admitting: Cardiovascular Disease

## 2016-08-18 DIAGNOSIS — Z9181 History of falling: Secondary | ICD-10-CM | POA: Diagnosis not present

## 2016-08-18 DIAGNOSIS — I639 Cerebral infarction, unspecified: Secondary | ICD-10-CM | POA: Diagnosis not present

## 2016-08-18 DIAGNOSIS — R159 Full incontinence of feces: Secondary | ICD-10-CM | POA: Diagnosis not present

## 2016-08-18 DIAGNOSIS — N3281 Overactive bladder: Secondary | ICD-10-CM | POA: Diagnosis not present

## 2016-08-18 DIAGNOSIS — I481 Persistent atrial fibrillation: Secondary | ICD-10-CM | POA: Diagnosis not present

## 2016-08-18 DIAGNOSIS — I10 Essential (primary) hypertension: Secondary | ICD-10-CM

## 2016-08-18 DIAGNOSIS — L97529 Non-pressure chronic ulcer of other part of left foot with unspecified severity: Secondary | ICD-10-CM | POA: Diagnosis not present

## 2016-08-18 DIAGNOSIS — F0150 Vascular dementia without behavioral disturbance: Secondary | ICD-10-CM | POA: Diagnosis not present

## 2016-08-18 DIAGNOSIS — I69354 Hemiplegia and hemiparesis following cerebral infarction affecting left non-dominant side: Secondary | ICD-10-CM | POA: Diagnosis not present

## 2016-08-18 DIAGNOSIS — M109 Gout, unspecified: Secondary | ICD-10-CM | POA: Diagnosis not present

## 2016-08-18 DIAGNOSIS — I69318 Other symptoms and signs involving cognitive functions following cerebral infarction: Secondary | ICD-10-CM | POA: Diagnosis not present

## 2016-08-18 DIAGNOSIS — I69391 Dysphagia following cerebral infarction: Secondary | ICD-10-CM | POA: Diagnosis not present

## 2016-08-18 DIAGNOSIS — R1310 Dysphagia, unspecified: Secondary | ICD-10-CM | POA: Diagnosis not present

## 2016-08-18 DIAGNOSIS — R569 Unspecified convulsions: Secondary | ICD-10-CM | POA: Diagnosis not present

## 2016-08-18 DIAGNOSIS — Z7901 Long term (current) use of anticoagulants: Secondary | ICD-10-CM | POA: Diagnosis not present

## 2016-08-18 DIAGNOSIS — M159 Polyosteoarthritis, unspecified: Secondary | ICD-10-CM | POA: Diagnosis not present

## 2016-08-18 DIAGNOSIS — L97519 Non-pressure chronic ulcer of other part of right foot with unspecified severity: Secondary | ICD-10-CM | POA: Insufficient documentation

## 2016-08-18 DIAGNOSIS — E7800 Pure hypercholesterolemia, unspecified: Secondary | ICD-10-CM | POA: Diagnosis not present

## 2016-08-18 NOTE — Patient Instructions (Signed)
Medication Instructions: Your physician recommends that you continue on your current medications as directed. Please refer to the Current Medication list given to you today.   Follow-Up: Your physician recommends that you schedule a follow-up appointment as needed with Dr. Berry.    

## 2016-08-18 NOTE — Progress Notes (Signed)
08/18/2016 Calvin Diaz   Jan 14, 1941  [id number]  Primary Physician Ronnald NianLalonde, John C, MD Primary Cardiologist: Runell GessJonathan J Kirsti Mcalpine MD Roseanne RenoFACP, FACC, FAHA, FSCAI  HPI:  Calvin Diaz  is a 76 year old married African-American male father of 4, grandfather and 6 grandchildren who is accompanied by his wife today. His primary care provider is Dr. Susann GivensLalonde. He is referred by Dr. Leanord Hawkingobson for peripheral vascular evaluation because of bilateral heel pressure ulcers.  I last saw him in the office 04/11/16. His factors include 50-75-pack-years of tobacco abuse having quit 8 months ago as well as history of hypertension and hyperlipidemia. He probably had a heart attack in 1990 records of that are unavailable. He's had a stroke last year which was somewhat disabling and a subsequent stroke in September after which she was discharged to a skilled nursing facility. He developed bilateral heel ulcers presumably related to pressure. He is seeing Dr. Roxan Hockeyobinson at the wound care center. Recent Dopplers performed office 03/01/16 revealed right ABI 0.95 and a left of 1.1. There is three-vessel runoff bilaterally. There was a moderate lesion in the distal right SFA. The patient is minimally ambulatory with a cane and really denies claudication.   since I saw him 4 months ago his heel ulcers have since healed. His dementia has progressed however.   Current Meds  Medication Sig  . apixaban (ELIQUIS) 5 MG TABS tablet Take 1 tablet (5 mg total) by mouth 2 (two) times daily.  . citalopram (CELEXA) 20 MG tablet Take 1 tablet (20 mg total) by mouth daily.  . colchicine 0.6 MG tablet Take 1 tablet (0.6 mg total) by mouth daily.  . divalproex (DEPAKOTE SPRINKLE) 125 MG capsule Take 1 capsule (125 mg total) by mouth 4 (four) times daily.  . memantine (NAMENDA) 5 MG tablet Take 2 tablets (10 mg total) by mouth daily.  . Multiple Vitamin (MULTIVITAMIN WITH MINERALS) TABS tablet Take 1 tablet by mouth daily.  . pravastatin (PRAVACHOL) 80  MG tablet Take 1 tablet (80 mg total) by mouth at bedtime.  . [DISCONTINUED] terazosin (HYTRIN) 1 MG capsule Take 1 capsule (1 mg total) by mouth at bedtime.     No Known Allergies  Social History   Social History  . Marital status: Divorced    Spouse name: N/A  . Number of children: 4  . Years of education: 12   Occupational History  . Retired    Social History Main Topics  . Smoking status: Former Smoker    Types: Cigarettes  . Smokeless tobacco: Former NeurosurgeonUser    Quit date: 08/18/2015  . Alcohol use No     Comment: Previously daily  . Drug use: No  . Sexual activity: Not Currently   Other Topics Concern  . Not on file   Social History Narrative   Lives at home with his wife.   Right-handed.   No caffeine use.        Review of Systems: General: negative for chills, fever, night sweats or weight changes.  Cardiovascular: negative for chest pain, dyspnea on exertion, edema, orthopnea, palpitations, paroxysmal nocturnal dyspnea or shortness of breath Dermatological: negative for rash Respiratory: negative for cough or wheezing Urologic: negative for hematuria Abdominal: negative for nausea, vomiting, diarrhea, bright red blood per rectum, melena, or hematemesis Neurologic: negative for visual changes, syncope, or dizziness All other systems reviewed and are otherwise negative except as noted above.    Blood pressure 122/66, pulse 94, height 5\' 11"  (1.803 m), weight 138  lb (62.6 kg).  General appearance: alert and no distress Neck: no adenopathy, no carotid bruit, no JVD, supple, symmetrical, trachea midline and thyroid not enlarged, symmetric, no tenderness/mass/nodules Lungs: clear to auscultation bilaterally Heart: regular rate and rhythm, S1, S2 normal, no murmur, click, rub or gallop Extremities: extremities normal, atraumatic, no cyanosis or edema  EKG not performed today  ASSESSMENT AND PLAN:   Skin ulcers of foot, bilateral [facility](HCC) Calvin Diaz was referred  to me by Dr. Leanord Hawkingobson for bilateral heel ulcers. His ABIs were essentially normal. I suspect is related to pressure from lying in bed secondary to his stroke. His ulcers have since healed. No further workup is required at this time.  Essential hypertension History of essential hypertension blood pressure measured at 122/66. He is not on blood pressure medications.      Runell GessJonathan J. Kaseem Vastine MD FACP,FACC,FAHA, [facility] 08/18/2016 10:56 AM

## 2016-08-18 NOTE — Assessment & Plan Note (Signed)
Calvin Diaz was referred to me by Dr. Leanord Hawkingobson for bilateral heel ulcers. His ABIs were essentially normal. I suspect is related to pressure from lying in bed secondary to his stroke. His ulcers have since healed. No further workup is required at this time.

## 2016-08-18 NOTE — Assessment & Plan Note (Signed)
History of essential hypertension blood pressure measured at 122/66. He is not on blood pressure medications.

## 2016-08-21 ENCOUNTER — Telehealth: Payer: Self-pay | Admitting: Family Medicine

## 2016-08-21 ENCOUNTER — Other Ambulatory Visit: Payer: Self-pay | Admitting: Nurse Practitioner

## 2016-08-21 DIAGNOSIS — I69318 Other symptoms and signs involving cognitive functions following cerebral infarction: Secondary | ICD-10-CM | POA: Diagnosis not present

## 2016-08-21 DIAGNOSIS — I10 Essential (primary) hypertension: Secondary | ICD-10-CM | POA: Diagnosis not present

## 2016-08-21 DIAGNOSIS — I481 Persistent atrial fibrillation: Secondary | ICD-10-CM | POA: Diagnosis not present

## 2016-08-21 DIAGNOSIS — M159 Polyosteoarthritis, unspecified: Secondary | ICD-10-CM | POA: Diagnosis not present

## 2016-08-21 DIAGNOSIS — F0150 Vascular dementia without behavioral disturbance: Secondary | ICD-10-CM | POA: Diagnosis not present

## 2016-08-21 DIAGNOSIS — I69354 Hemiplegia and hemiparesis following cerebral infarction affecting left non-dominant side: Secondary | ICD-10-CM | POA: Diagnosis not present

## 2016-08-21 NOTE — Telephone Encounter (Signed)
nicoletta from [facility] called requesting a verbal order for a skilled nurse to come out 1 time a week for 5 weeks and 3 prn as need and a 1 time medical social worker consultation  visit and a PT evualtion  She can be reached at [phone]

## 2016-08-21 NOTE — Telephone Encounter (Signed)
Calvin Diaz was informed Dr.lalonde said okay she verbalized understanding

## 2016-08-21 NOTE — Telephone Encounter (Signed)
Okay 

## 2016-08-22 DIAGNOSIS — M159 Polyosteoarthritis, unspecified: Secondary | ICD-10-CM | POA: Diagnosis not present

## 2016-08-22 DIAGNOSIS — F0150 Vascular dementia without behavioral disturbance: Secondary | ICD-10-CM | POA: Diagnosis not present

## 2016-08-22 DIAGNOSIS — I10 Essential (primary) hypertension: Secondary | ICD-10-CM | POA: Diagnosis not present

## 2016-08-22 DIAGNOSIS — I481 Persistent atrial fibrillation: Secondary | ICD-10-CM | POA: Diagnosis not present

## 2016-08-22 DIAGNOSIS — I69318 Other symptoms and signs involving cognitive functions following cerebral infarction: Secondary | ICD-10-CM | POA: Diagnosis not present

## 2016-08-22 DIAGNOSIS — I69354 Hemiplegia and hemiparesis following cerebral infarction affecting left non-dominant side: Secondary | ICD-10-CM | POA: Diagnosis not present

## 2016-08-23 DIAGNOSIS — I69318 Other symptoms and signs involving cognitive functions following cerebral infarction: Secondary | ICD-10-CM | POA: Diagnosis not present

## 2016-08-23 DIAGNOSIS — M159 Polyosteoarthritis, unspecified: Secondary | ICD-10-CM | POA: Diagnosis not present

## 2016-08-23 DIAGNOSIS — I481 Persistent atrial fibrillation: Secondary | ICD-10-CM | POA: Diagnosis not present

## 2016-08-23 DIAGNOSIS — I10 Essential (primary) hypertension: Secondary | ICD-10-CM | POA: Diagnosis not present

## 2016-08-23 DIAGNOSIS — I69354 Hemiplegia and hemiparesis following cerebral infarction affecting left non-dominant side: Secondary | ICD-10-CM | POA: Diagnosis not present

## 2016-08-23 DIAGNOSIS — F0150 Vascular dementia without behavioral disturbance: Secondary | ICD-10-CM | POA: Diagnosis not present

## 2016-08-25 ENCOUNTER — Telehealth: Payer: Self-pay | Admitting: Family Medicine

## 2016-08-25 DIAGNOSIS — I69354 Hemiplegia and hemiparesis following cerebral infarction affecting left non-dominant side: Secondary | ICD-10-CM | POA: Diagnosis not present

## 2016-08-25 DIAGNOSIS — M159 Polyosteoarthritis, unspecified: Secondary | ICD-10-CM | POA: Diagnosis not present

## 2016-08-25 DIAGNOSIS — I481 Persistent atrial fibrillation: Secondary | ICD-10-CM | POA: Diagnosis not present

## 2016-08-25 DIAGNOSIS — I10 Essential (primary) hypertension: Secondary | ICD-10-CM | POA: Diagnosis not present

## 2016-08-25 DIAGNOSIS — I69318 Other symptoms and signs involving cognitive functions following cerebral infarction: Secondary | ICD-10-CM | POA: Diagnosis not present

## 2016-08-25 DIAGNOSIS — F0150 Vascular dementia without behavioral disturbance: Secondary | ICD-10-CM | POA: Diagnosis not present

## 2016-08-25 NOTE — Telephone Encounter (Signed)
Pt's wife call requesting another FL2 be completed so that she can take it to an appointment at social services on Tues (8/7). [facility]. Also she was advised to make sure that [facility] is checked on line #15 of the FL2 and proper dates for this month is notated. Form sent back to Dr. Susann GivensLalonde for completion and signature.

## 2016-08-29 NOTE — Telephone Encounter (Signed)
Form completed, pt called reached voice mail, lmtrc.

## 2016-08-30 DIAGNOSIS — F0150 Vascular dementia without behavioral disturbance: Secondary | ICD-10-CM | POA: Diagnosis not present

## 2016-08-30 DIAGNOSIS — I10 Essential (primary) hypertension: Secondary | ICD-10-CM | POA: Diagnosis not present

## 2016-08-30 DIAGNOSIS — I69318 Other symptoms and signs involving cognitive functions following cerebral infarction: Secondary | ICD-10-CM | POA: Diagnosis not present

## 2016-08-30 DIAGNOSIS — I481 Persistent atrial fibrillation: Secondary | ICD-10-CM | POA: Diagnosis not present

## 2016-08-30 DIAGNOSIS — I69354 Hemiplegia and hemiparesis following cerebral infarction affecting left non-dominant side: Secondary | ICD-10-CM | POA: Diagnosis not present

## 2016-08-30 DIAGNOSIS — M159 Polyosteoarthritis, unspecified: Secondary | ICD-10-CM | POA: Diagnosis not present

## 2016-09-01 DIAGNOSIS — I69354 Hemiplegia and hemiparesis following cerebral infarction affecting left non-dominant side: Secondary | ICD-10-CM | POA: Diagnosis not present

## 2016-09-01 DIAGNOSIS — I481 Persistent atrial fibrillation: Secondary | ICD-10-CM | POA: Diagnosis not present

## 2016-09-01 DIAGNOSIS — M159 Polyosteoarthritis, unspecified: Secondary | ICD-10-CM | POA: Diagnosis not present

## 2016-09-01 DIAGNOSIS — I69318 Other symptoms and signs involving cognitive functions following cerebral infarction: Secondary | ICD-10-CM | POA: Diagnosis not present

## 2016-09-01 DIAGNOSIS — I10 Essential (primary) hypertension: Secondary | ICD-10-CM | POA: Diagnosis not present

## 2016-09-01 DIAGNOSIS — F0150 Vascular dementia without behavioral disturbance: Secondary | ICD-10-CM | POA: Diagnosis not present

## 2016-09-04 DIAGNOSIS — I69318 Other symptoms and signs involving cognitive functions following cerebral infarction: Secondary | ICD-10-CM | POA: Diagnosis not present

## 2016-09-04 DIAGNOSIS — I481 Persistent atrial fibrillation: Secondary | ICD-10-CM | POA: Diagnosis not present

## 2016-09-04 DIAGNOSIS — M159 Polyosteoarthritis, unspecified: Secondary | ICD-10-CM | POA: Diagnosis not present

## 2016-09-04 DIAGNOSIS — I69354 Hemiplegia and hemiparesis following cerebral infarction affecting left non-dominant side: Secondary | ICD-10-CM | POA: Diagnosis not present

## 2016-09-04 DIAGNOSIS — I10 Essential (primary) hypertension: Secondary | ICD-10-CM | POA: Diagnosis not present

## 2016-09-04 DIAGNOSIS — F0150 Vascular dementia without behavioral disturbance: Secondary | ICD-10-CM | POA: Diagnosis not present

## 2016-09-05 ENCOUNTER — Telehealth: Payer: Self-pay | Admitting: Family Medicine

## 2016-09-05 DIAGNOSIS — I69318 Other symptoms and signs involving cognitive functions following cerebral infarction: Secondary | ICD-10-CM | POA: Diagnosis not present

## 2016-09-05 DIAGNOSIS — F0150 Vascular dementia without behavioral disturbance: Secondary | ICD-10-CM | POA: Diagnosis not present

## 2016-09-05 DIAGNOSIS — I481 Persistent atrial fibrillation: Secondary | ICD-10-CM | POA: Diagnosis not present

## 2016-09-05 DIAGNOSIS — I69354 Hemiplegia and hemiparesis following cerebral infarction affecting left non-dominant side: Secondary | ICD-10-CM | POA: Diagnosis not present

## 2016-09-05 DIAGNOSIS — I10 Essential (primary) hypertension: Secondary | ICD-10-CM | POA: Diagnosis not present

## 2016-09-05 DIAGNOSIS — M159 Polyosteoarthritis, unspecified: Secondary | ICD-10-CM | POA: Diagnosis not present

## 2016-09-05 NOTE — Telephone Encounter (Signed)
Mia with [facility] called and states patients are wanting to move in  Wednesday.  Needed FL2 corrected as they are not a skilled nursing facility they are assisted living and memory care.  Corrected and resent to Mia.

## 2016-09-08 DIAGNOSIS — F0150 Vascular dementia without behavioral disturbance: Secondary | ICD-10-CM | POA: Diagnosis not present

## 2016-09-08 DIAGNOSIS — M159 Polyosteoarthritis, unspecified: Secondary | ICD-10-CM | POA: Diagnosis not present

## 2016-09-08 DIAGNOSIS — I69354 Hemiplegia and hemiparesis following cerebral infarction affecting left non-dominant side: Secondary | ICD-10-CM | POA: Diagnosis not present

## 2016-09-08 DIAGNOSIS — I69318 Other symptoms and signs involving cognitive functions following cerebral infarction: Secondary | ICD-10-CM | POA: Diagnosis not present

## 2016-09-08 DIAGNOSIS — I10 Essential (primary) hypertension: Secondary | ICD-10-CM | POA: Diagnosis not present

## 2016-09-08 DIAGNOSIS — I481 Persistent atrial fibrillation: Secondary | ICD-10-CM | POA: Diagnosis not present

## 2016-09-12 DIAGNOSIS — I10 Essential (primary) hypertension: Secondary | ICD-10-CM | POA: Diagnosis not present

## 2016-09-12 DIAGNOSIS — I481 Persistent atrial fibrillation: Secondary | ICD-10-CM | POA: Diagnosis not present

## 2016-09-12 DIAGNOSIS — I69318 Other symptoms and signs involving cognitive functions following cerebral infarction: Secondary | ICD-10-CM | POA: Diagnosis not present

## 2016-09-12 DIAGNOSIS — I69354 Hemiplegia and hemiparesis following cerebral infarction affecting left non-dominant side: Secondary | ICD-10-CM | POA: Diagnosis not present

## 2016-09-12 DIAGNOSIS — F0150 Vascular dementia without behavioral disturbance: Secondary | ICD-10-CM | POA: Diagnosis not present

## 2016-09-12 DIAGNOSIS — M159 Polyosteoarthritis, unspecified: Secondary | ICD-10-CM | POA: Diagnosis not present

## 2016-09-13 DIAGNOSIS — I69318 Other symptoms and signs involving cognitive functions following cerebral infarction: Secondary | ICD-10-CM | POA: Diagnosis not present

## 2016-09-13 DIAGNOSIS — I10 Essential (primary) hypertension: Secondary | ICD-10-CM | POA: Diagnosis not present

## 2016-09-13 DIAGNOSIS — M159 Polyosteoarthritis, unspecified: Secondary | ICD-10-CM | POA: Diagnosis not present

## 2016-09-13 DIAGNOSIS — I69354 Hemiplegia and hemiparesis following cerebral infarction affecting left non-dominant side: Secondary | ICD-10-CM | POA: Diagnosis not present

## 2016-09-13 DIAGNOSIS — F0150 Vascular dementia without behavioral disturbance: Secondary | ICD-10-CM | POA: Diagnosis not present

## 2016-09-13 DIAGNOSIS — I481 Persistent atrial fibrillation: Secondary | ICD-10-CM | POA: Diagnosis not present

## 2016-09-14 DIAGNOSIS — F0391 Unspecified dementia with behavioral disturbance: Secondary | ICD-10-CM | POA: Diagnosis not present

## 2016-09-14 DIAGNOSIS — M159 Polyosteoarthritis, unspecified: Secondary | ICD-10-CM | POA: Diagnosis not present

## 2016-09-14 DIAGNOSIS — F321 Major depressive disorder, single episode, moderate: Secondary | ICD-10-CM | POA: Diagnosis not present

## 2016-09-14 DIAGNOSIS — F0150 Vascular dementia without behavioral disturbance: Secondary | ICD-10-CM | POA: Diagnosis not present

## 2016-09-14 DIAGNOSIS — I481 Persistent atrial fibrillation: Secondary | ICD-10-CM | POA: Diagnosis not present

## 2016-09-14 DIAGNOSIS — I10 Essential (primary) hypertension: Secondary | ICD-10-CM | POA: Diagnosis not present

## 2016-09-14 DIAGNOSIS — I69354 Hemiplegia and hemiparesis following cerebral infarction affecting left non-dominant side: Secondary | ICD-10-CM | POA: Diagnosis not present

## 2016-09-14 DIAGNOSIS — I69318 Other symptoms and signs involving cognitive functions following cerebral infarction: Secondary | ICD-10-CM | POA: Diagnosis not present

## 2016-09-18 DIAGNOSIS — I69354 Hemiplegia and hemiparesis following cerebral infarction affecting left non-dominant side: Secondary | ICD-10-CM | POA: Diagnosis not present

## 2016-09-18 DIAGNOSIS — I69318 Other symptoms and signs involving cognitive functions following cerebral infarction: Secondary | ICD-10-CM | POA: Diagnosis not present

## 2016-09-18 DIAGNOSIS — F0150 Vascular dementia without behavioral disturbance: Secondary | ICD-10-CM | POA: Diagnosis not present

## 2016-09-18 DIAGNOSIS — I10 Essential (primary) hypertension: Secondary | ICD-10-CM | POA: Diagnosis not present

## 2016-09-18 DIAGNOSIS — M159 Polyosteoarthritis, unspecified: Secondary | ICD-10-CM | POA: Diagnosis not present

## 2016-09-18 DIAGNOSIS — I481 Persistent atrial fibrillation: Secondary | ICD-10-CM | POA: Diagnosis not present

## 2016-09-19 DIAGNOSIS — F0150 Vascular dementia without behavioral disturbance: Secondary | ICD-10-CM | POA: Diagnosis not present

## 2016-09-19 DIAGNOSIS — I69354 Hemiplegia and hemiparesis following cerebral infarction affecting left non-dominant side: Secondary | ICD-10-CM | POA: Diagnosis not present

## 2016-09-19 DIAGNOSIS — I69318 Other symptoms and signs involving cognitive functions following cerebral infarction: Secondary | ICD-10-CM | POA: Diagnosis not present

## 2016-09-19 DIAGNOSIS — M159 Polyosteoarthritis, unspecified: Secondary | ICD-10-CM | POA: Diagnosis not present

## 2016-09-19 DIAGNOSIS — I481 Persistent atrial fibrillation: Secondary | ICD-10-CM | POA: Diagnosis not present

## 2016-09-19 DIAGNOSIS — I10 Essential (primary) hypertension: Secondary | ICD-10-CM | POA: Diagnosis not present

## 2016-09-22 DIAGNOSIS — M159 Polyosteoarthritis, unspecified: Secondary | ICD-10-CM | POA: Diagnosis not present

## 2016-09-22 DIAGNOSIS — I69318 Other symptoms and signs involving cognitive functions following cerebral infarction: Secondary | ICD-10-CM | POA: Diagnosis not present

## 2016-09-22 DIAGNOSIS — I481 Persistent atrial fibrillation: Secondary | ICD-10-CM | POA: Diagnosis not present

## 2016-09-22 DIAGNOSIS — F0150 Vascular dementia without behavioral disturbance: Secondary | ICD-10-CM | POA: Diagnosis not present

## 2016-09-22 DIAGNOSIS — I69354 Hemiplegia and hemiparesis following cerebral infarction affecting left non-dominant side: Secondary | ICD-10-CM | POA: Diagnosis not present

## 2016-09-22 DIAGNOSIS — I10 Essential (primary) hypertension: Secondary | ICD-10-CM | POA: Diagnosis not present

## 2016-09-25 DIAGNOSIS — I69318 Other symptoms and signs involving cognitive functions following cerebral infarction: Secondary | ICD-10-CM | POA: Diagnosis not present

## 2016-09-25 DIAGNOSIS — I481 Persistent atrial fibrillation: Secondary | ICD-10-CM | POA: Diagnosis not present

## 2016-09-25 DIAGNOSIS — F0150 Vascular dementia without behavioral disturbance: Secondary | ICD-10-CM | POA: Diagnosis not present

## 2016-09-25 DIAGNOSIS — I69354 Hemiplegia and hemiparesis following cerebral infarction affecting left non-dominant side: Secondary | ICD-10-CM | POA: Diagnosis not present

## 2016-09-25 DIAGNOSIS — I10 Essential (primary) hypertension: Secondary | ICD-10-CM | POA: Diagnosis not present

## 2016-09-25 DIAGNOSIS — M159 Polyosteoarthritis, unspecified: Secondary | ICD-10-CM | POA: Diagnosis not present

## 2016-09-27 DIAGNOSIS — R2681 Unsteadiness on feet: Secondary | ICD-10-CM | POA: Diagnosis not present

## 2016-09-27 DIAGNOSIS — M19079 Primary osteoarthritis, unspecified ankle and foot: Secondary | ICD-10-CM | POA: Diagnosis not present

## 2016-09-27 DIAGNOSIS — M79673 Pain in unspecified foot: Secondary | ICD-10-CM | POA: Diagnosis not present

## 2016-09-27 DIAGNOSIS — L603 Nail dystrophy: Secondary | ICD-10-CM | POA: Diagnosis not present

## 2016-09-28 DIAGNOSIS — F0391 Unspecified dementia with behavioral disturbance: Secondary | ICD-10-CM | POA: Diagnosis not present

## 2016-09-28 DIAGNOSIS — F321 Major depressive disorder, single episode, moderate: Secondary | ICD-10-CM | POA: Diagnosis not present

## 2016-09-29 DIAGNOSIS — M159 Polyosteoarthritis, unspecified: Secondary | ICD-10-CM | POA: Diagnosis not present

## 2016-09-29 DIAGNOSIS — I69354 Hemiplegia and hemiparesis following cerebral infarction affecting left non-dominant side: Secondary | ICD-10-CM | POA: Diagnosis not present

## 2016-09-29 DIAGNOSIS — I10 Essential (primary) hypertension: Secondary | ICD-10-CM | POA: Diagnosis not present

## 2016-09-29 DIAGNOSIS — F0150 Vascular dementia without behavioral disturbance: Secondary | ICD-10-CM | POA: Diagnosis not present

## 2016-09-29 DIAGNOSIS — I69318 Other symptoms and signs involving cognitive functions following cerebral infarction: Secondary | ICD-10-CM | POA: Diagnosis not present

## 2016-09-29 DIAGNOSIS — I481 Persistent atrial fibrillation: Secondary | ICD-10-CM | POA: Diagnosis not present

## 2016-10-03 DIAGNOSIS — F0150 Vascular dementia without behavioral disturbance: Secondary | ICD-10-CM | POA: Diagnosis not present

## 2016-10-03 DIAGNOSIS — M159 Polyosteoarthritis, unspecified: Secondary | ICD-10-CM | POA: Diagnosis not present

## 2016-10-03 DIAGNOSIS — I69354 Hemiplegia and hemiparesis following cerebral infarction affecting left non-dominant side: Secondary | ICD-10-CM | POA: Diagnosis not present

## 2016-10-03 DIAGNOSIS — I10 Essential (primary) hypertension: Secondary | ICD-10-CM | POA: Diagnosis not present

## 2016-10-03 DIAGNOSIS — I69318 Other symptoms and signs involving cognitive functions following cerebral infarction: Secondary | ICD-10-CM | POA: Diagnosis not present

## 2016-10-03 DIAGNOSIS — I481 Persistent atrial fibrillation: Secondary | ICD-10-CM | POA: Diagnosis not present

## 2016-10-05 ENCOUNTER — Ambulatory Visit: Payer: Medicare Other | Admitting: Neurology

## 2016-10-06 DIAGNOSIS — M159 Polyosteoarthritis, unspecified: Secondary | ICD-10-CM | POA: Diagnosis not present

## 2016-10-06 DIAGNOSIS — I481 Persistent atrial fibrillation: Secondary | ICD-10-CM | POA: Diagnosis not present

## 2016-10-06 DIAGNOSIS — F0150 Vascular dementia without behavioral disturbance: Secondary | ICD-10-CM | POA: Diagnosis not present

## 2016-10-06 DIAGNOSIS — I69318 Other symptoms and signs involving cognitive functions following cerebral infarction: Secondary | ICD-10-CM | POA: Diagnosis not present

## 2016-10-06 DIAGNOSIS — I10 Essential (primary) hypertension: Secondary | ICD-10-CM | POA: Diagnosis not present

## 2016-10-06 DIAGNOSIS — I69354 Hemiplegia and hemiparesis following cerebral infarction affecting left non-dominant side: Secondary | ICD-10-CM | POA: Diagnosis not present

## 2016-10-08 ENCOUNTER — Encounter (HOSPITAL_COMMUNITY): Payer: Self-pay | Admitting: Emergency Medicine

## 2016-10-08 ENCOUNTER — Emergency Department (HOSPITAL_COMMUNITY)
Admission: EM | Admit: 2016-10-08 | Discharge: 2016-10-09 | Disposition: A | Discharge status: Home / Self Care | Payer: Medicare Other | Attending: Emergency Medicine | Admitting: Emergency Medicine

## 2016-10-08 DIAGNOSIS — F0390 Unspecified dementia without behavioral disturbance: Secondary | ICD-10-CM | POA: Insufficient documentation

## 2016-10-08 DIAGNOSIS — W06XXXA Fall from bed, initial encounter: Secondary | ICD-10-CM | POA: Diagnosis not present

## 2016-10-08 DIAGNOSIS — I10 Essential (primary) hypertension: Secondary | ICD-10-CM | POA: Diagnosis not present

## 2016-10-08 DIAGNOSIS — R531 Weakness: Secondary | ICD-10-CM | POA: Diagnosis not present

## 2016-10-08 DIAGNOSIS — S0990XA Unspecified injury of head, initial encounter: Secondary | ICD-10-CM | POA: Diagnosis not present

## 2016-10-08 DIAGNOSIS — Z955 Presence of coronary angioplasty implant and graft: Secondary | ICD-10-CM | POA: Insufficient documentation

## 2016-10-08 DIAGNOSIS — I252 Old myocardial infarction: Secondary | ICD-10-CM | POA: Diagnosis not present

## 2016-10-08 DIAGNOSIS — Z7901 Long term (current) use of anticoagulants: Secondary | ICD-10-CM | POA: Insufficient documentation

## 2016-10-08 DIAGNOSIS — Z8673 Personal history of transient ischemic attack (TIA), and cerebral infarction without residual deficits: Secondary | ICD-10-CM | POA: Diagnosis not present

## 2016-10-08 DIAGNOSIS — Z87891 Personal history of nicotine dependence: Secondary | ICD-10-CM | POA: Diagnosis not present

## 2016-10-08 DIAGNOSIS — Z79899 Other long term (current) drug therapy: Secondary | ICD-10-CM | POA: Diagnosis not present

## 2016-10-08 DIAGNOSIS — Z0389 Encounter for observation for other suspected diseases and conditions ruled out: Secondary | ICD-10-CM | POA: Diagnosis present

## 2016-10-08 DIAGNOSIS — W19XXXA Unspecified fall, initial encounter: Secondary | ICD-10-CM

## 2016-10-08 DIAGNOSIS — M2560 Stiffness of unspecified joint, not elsewhere classified: Secondary | ICD-10-CM | POA: Insufficient documentation

## 2016-10-08 DIAGNOSIS — S199XXA Unspecified injury of neck, initial encounter: Secondary | ICD-10-CM | POA: Diagnosis not present

## 2016-10-08 DIAGNOSIS — R404 Transient alteration of awareness: Secondary | ICD-10-CM | POA: Diagnosis not present
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
## 2016-10-09 ENCOUNTER — Emergency Department (HOSPITAL_COMMUNITY): Payer: Medicare Other

## 2016-10-09 DIAGNOSIS — F0150 Vascular dementia without behavioral disturbance: Secondary | ICD-10-CM | POA: Diagnosis not present

## 2016-10-09 DIAGNOSIS — S199XXA Unspecified injury of neck, initial encounter: Secondary | ICD-10-CM | POA: Diagnosis not present

## 2016-10-09 DIAGNOSIS — R55 Syncope and collapse: Secondary | ICD-10-CM | POA: Diagnosis not present

## 2016-10-09 DIAGNOSIS — I481 Persistent atrial fibrillation: Secondary | ICD-10-CM | POA: Diagnosis not present

## 2016-10-09 DIAGNOSIS — I10 Essential (primary) hypertension: Secondary | ICD-10-CM | POA: Diagnosis not present

## 2016-10-09 DIAGNOSIS — S0990XA Unspecified injury of head, initial encounter: Secondary | ICD-10-CM | POA: Diagnosis not present

## 2016-10-09 DIAGNOSIS — I69318 Other symptoms and signs involving cognitive functions following cerebral infarction: Secondary | ICD-10-CM | POA: Diagnosis not present

## 2016-10-09 DIAGNOSIS — I69354 Hemiplegia and hemiparesis following cerebral infarction affecting left non-dominant side: Secondary | ICD-10-CM | POA: Diagnosis not present

## 2016-10-09 DIAGNOSIS — M159 Polyosteoarthritis, unspecified: Secondary | ICD-10-CM | POA: Diagnosis not present

## 2016-10-09 NOTE — Discharge Instructions (Signed)
No traumatic injuries were identified in your emergency department evaluation today.  Please follow-up with your doctor.

## 2016-10-09 NOTE — ED Notes (Signed)
Patient transported to CT 

## 2016-10-09 NOTE — ED Provider Notes (Signed)
[facility] DEPT Provider Note   CSN: [id number] Arrival date & time: 10/08/16  2326     History   Chief Complaint Chief Complaint  Patient presents with  . Fall    HPI Calvin Diaz is a 76 y.o. male.  Patient with a hx of dementia, prior stroke, MI, HTN, HL, and who is DNR, presents to the ED with a chief complaint of fall.  He states that he rolled out of his bed.  He states that he rolled out sideways.  He denies any pain.  History is somewhat limited due to dementia.  LEVEL 5 caveat applies.  Chart review shows that patient takes eliquis.   The history is provided by the patient. No language interpreter was used.    Past Medical History:  Diagnosis Date  . Dementia   . High cholesterol   . Hypertension   . Myocardial infarction (HCC)   . Stroke [facility])     Patient Active Problem List   Diagnosis Date Noted  . Skin ulcers of foot, bilateral (HCC) 08/18/2016  . Gout 07/31/2016  . Seizures (HCC) 03/31/2016  . Dysphagia, post-stroke   . Tachycardia   . Leukocytosis   . Polyarticular arthritis   . Hypokalemia 12/14/2015  . OAB (overactive bladder) 09/20/2015  . Essential hypertension 09/07/2015  . Atrial fibrillation (HCC) 09/07/2015  . Dementia 09/07/2015  . History of CVA with residual deficit 08/18/2015  . Former smoker 08/18/2015  . History of alcohol abuse 08/18/2015  . Hyperlipidemia 08/18/2015    Past Surgical History:  Procedure Laterality Date  . CORONARY ANGIOPLASTY         Home Medications    Prior to Admission medications   Medication Sig Start Date End Date Taking? Authorizing Provider  apixaban (ELIQUIS) 5 MG TABS tablet Take 1 tablet (5 mg total) by mouth 2 (two) times daily. 06/13/16   Ronnald Nian, MD  citalopram (CELEXA) 20 MG tablet Take 1 tablet (20 mg total) by mouth daily. 06/13/16   Ronnald Nian, MD  colchicine 0.6 MG tablet Take 1 tablet (0.6 mg total) by mouth daily. 06/13/16   Ronnald Nian, MD  divalproex (DEPAKOTE  SPRINKLE) 125 MG capsule Take 1 capsule (125 mg total) by mouth 4 (four) times daily. 05/10/16   Sharon Seller, NP  memantine (NAMENDA) 5 MG tablet Take 2 tablets (10 mg total) by mouth daily. 06/13/16   Ronnald Nian, MD  Multiple Vitamin (MULTIVITAMIN WITH MINERALS) TABS tablet Take 1 tablet by mouth daily.    [provider]  pravastatin (PRAVACHOL) 80 MG tablet Take 1 tablet (80 mg total) by mouth at bedtime. 05/10/16   Sharon Seller, NP    Family History Family History  Problem Relation Age of Onset  . Arrhythmia Mother        Had pacemaker  . Stroke Mother   . Stroke Maternal Aunt   . Heart attack Father     Social History Social History  Substance Use Topics  . Smoking status: Former Smoker    Types: Cigarettes  . Smokeless tobacco: Former Neurosurgeon    Quit date: 08/18/2015  . Alcohol use No     Comment: Previously daily     Allergies   Patient has no known allergies.   Review of Systems Review of Systems  Unable to perform ROS: Dementia     Physical Exam Updated Vital Signs BP 140/77 (BP Location: Right Arm)   Pulse 64   Temp 97.7  F (36.5 C) (Oral)   Resp 17   SpO2 100%   Physical Exam  Constitutional: He is oriented to person, place, and time. He appears well-developed and well-nourished.  In c-collar  HENT:  Head: Normocephalic and atraumatic.  Eyes: Pupils are equal, round, and reactive to light. Conjunctivae and EOM are normal. Right eye exhibits no discharge. Left eye exhibits no discharge. No scleral icterus.  Neck: Normal range of motion. Neck supple. No JVD present.  Cardiovascular: Normal rate, regular rhythm and normal heart sounds.  Exam reveals no gallop and no friction rub.   No murmur heard. Pulmonary/Chest: Effort normal and breath sounds normal. No respiratory distress. He has no wheezes. He has no rales. He exhibits no tenderness.  Abdominal: Soft. He exhibits no distension and no mass. There is no tenderness. There is no  rebound and no guarding.  Musculoskeletal: Normal range of motion. He exhibits no edema or tenderness.  Left arm decreased active ROM, remaining extremities have normal active ROM  Passive ROM of all extremities is without pain  No obvious bony abnormality or deformity  Neurological: He is alert and oriented to person, place, and time.  Skin: Skin is warm and dry.  Psychiatric: He has a normal mood and affect. His behavior is normal. Judgment and thought content normal.  Nursing note and vitals reviewed.    ED Treatments / Results  Labs (all labs ordered are listed, but only abnormal results are displayed) Labs Reviewed - No data to display  EKG  EKG Interpretation None       Radiology Ct Head Wo Contrast  Result Date: 10/09/2016 CLINICAL DATA:  Minor head trauma. EXAM: CT HEAD WITHOUT CONTRAST CT CERVICAL SPINE WITHOUT CONTRAST TECHNIQUE: Multidetector CT imaging of the head and cervical spine was performed following the standard protocol without intravenous contrast. Multiplanar CT image reconstructions of the cervical spine were also generated. COMPARISON:  CT head 07/24/2016.  CT angio head and neck 12/15/2015 FINDINGS: CT HEAD FINDINGS Brain: Diffuse bilateral cerebral atrophy. Ventricular dilatation consistent with central atrophy. Low-attenuation changes in the deep white matter consistent small vessel ischemia. Large area of encephalomalacia involving the right frontotemporoparietal region consistent with old infarct in the distribution of the right middle cerebral artery. Old encephalomalacia in the left frontal lobe consistent with infarct in the left middle cerebral artery distribution. No change since previous study. No mass effect or midline shift. No abnormal extra-axial fluid collections. Gray-white matter junctions are distinct. Basal cisterns are not effaced. No acute intracranial hemorrhage. Vascular: Vascular calcifications in the internal carotid artery's. Skull:  Calvarium appears intact. Sinuses/Orbits: Paranasal sinuses and mastoid air cells are clear. Other: No change since previous study. CT CERVICAL SPINE FINDINGS Alignment: Reversal of the usual cervical lordosis. This is unchanged since previous study in may be degenerative or due to patient positioning but ligamentous injury or muscle spasm could also have this appearance and are not excluded. No anterior subluxation. Normal alignment of the facet joints. C1-2 articulation appears intact. Skull base and vertebrae: No vertebral compression deformities. No focal bone lesion or bone destruction. Bone cortex appears intact. Soft tissues and spinal canal: No prevertebral soft tissue swelling. No paraspinal infiltration. Vascular calcifications. Disc levels: Degenerative changes throughout the cervical spine with narrowed interspaces and associated endplate hypertrophic changes. Upper chest: Scarring in the lung apices. Other: None. IMPRESSION: 1. No acute intracranial abnormalities. Chronic atrophy and small vessel ischemic changes. Old bilateral infarcts. 2. Nonspecific straightening of usual cervical lordosis. Degenerative changes throughout the  cervical spine. No acute displaced fractures identified. Electronically Signed   By: Burman Nieves M.D.   On: 10/09/2016 01:30   Ct Cervical Spine Wo Contrast  Result Date: 10/09/2016 CLINICAL DATA:  Minor head trauma. EXAM: CT HEAD WITHOUT CONTRAST CT CERVICAL SPINE WITHOUT CONTRAST TECHNIQUE: Multidetector CT imaging of the head and cervical spine was performed following the standard protocol without intravenous contrast. Multiplanar CT image reconstructions of the cervical spine were also generated. COMPARISON:  CT head 07/24/2016.  CT angio head and neck 12/15/2015 FINDINGS: CT HEAD FINDINGS Brain: Diffuse bilateral cerebral atrophy. Ventricular dilatation consistent with central atrophy. Low-attenuation changes in the deep white matter consistent small vessel  ischemia. Large area of encephalomalacia involving the right frontotemporoparietal region consistent with old infarct in the distribution of the right middle cerebral artery. Old encephalomalacia in the left frontal lobe consistent with infarct in the left middle cerebral artery distribution. No change since previous study. No mass effect or midline shift. No abnormal extra-axial fluid collections. Gray-white matter junctions are distinct. Basal cisterns are not effaced. No acute intracranial hemorrhage. Vascular: Vascular calcifications in the internal carotid artery's. Skull: Calvarium appears intact. Sinuses/Orbits: Paranasal sinuses and mastoid air cells are clear. Other: No change since previous study. CT CERVICAL SPINE FINDINGS Alignment: Reversal of the usual cervical lordosis. This is unchanged since previous study in may be degenerative or due to patient positioning but ligamentous injury or muscle spasm could also have this appearance and are not excluded. No anterior subluxation. Normal alignment of the facet joints. C1-2 articulation appears intact. Skull base and vertebrae: No vertebral compression deformities. No focal bone lesion or bone destruction. Bone cortex appears intact. Soft tissues and spinal canal: No prevertebral soft tissue swelling. No paraspinal infiltration. Vascular calcifications. Disc levels: Degenerative changes throughout the cervical spine with narrowed interspaces and associated endplate hypertrophic changes. Upper chest: Scarring in the lung apices. Other: None. IMPRESSION: 1. No acute intracranial abnormalities. Chronic atrophy and small vessel ischemic changes. Old bilateral infarcts. 2. Nonspecific straightening of usual cervical lordosis. Degenerative changes throughout the cervical spine. No acute displaced fractures identified. Electronically Signed   By: Burman Nieves M.D.   On: 10/09/2016 01:30    Procedures Procedures (including critical care time)  Medications  Ordered in ED Medications - No data to display   Initial Impression / Assessment and Plan / ED Course  I have reviewed the triage vital signs and the nursing notes.  Pertinent labs & imaging results that were available during my care of the patient were reviewed by me and considered in my medical decision making (see chart for details).     Patient with fall from bed.  On eliquis.  CT head and neck.  No evidence of traumatic injury on exam.  DC home if normal.  CT imaging is negative for acute findings.    Shared visit with Dr. Preston Fleeting.    Final Clinical Impressions(s) / ED Diagnoses   Final diagnoses:  Fall, initial encounter    New Prescriptions New Prescriptions   No medications on file     Roxy Horseman, Cordelia Poche 10/09/16 0151    Dione Booze, MD 10/09/16 [phone]

## 2016-10-10 ENCOUNTER — Emergency Department (HOSPITAL_COMMUNITY)
Admission: EM | Admit: 2016-10-10 | Discharge: 2016-10-10 | Disposition: A | Discharge status: Home / Self Care | Payer: Medicare Other | Attending: Emergency Medicine | Admitting: Emergency Medicine

## 2016-10-10 ENCOUNTER — Encounter (HOSPITAL_COMMUNITY): Payer: Self-pay | Admitting: Emergency Medicine

## 2016-10-10 ENCOUNTER — Emergency Department (HOSPITAL_COMMUNITY): Payer: Medicare Other

## 2016-10-10 DIAGNOSIS — S0990XA Unspecified injury of head, initial encounter: Secondary | ICD-10-CM | POA: Diagnosis not present

## 2016-10-10 DIAGNOSIS — F0390 Unspecified dementia without behavioral disturbance: Secondary | ICD-10-CM | POA: Diagnosis not present

## 2016-10-10 DIAGNOSIS — S098XXA Other specified injuries of head, initial encounter: Secondary | ICD-10-CM | POA: Diagnosis not present

## 2016-10-10 DIAGNOSIS — Z7901 Long term (current) use of anticoagulants: Secondary | ICD-10-CM | POA: Insufficient documentation

## 2016-10-10 DIAGNOSIS — I252 Old myocardial infarction: Secondary | ICD-10-CM | POA: Insufficient documentation

## 2016-10-10 DIAGNOSIS — W19XXXA Unspecified fall, initial encounter: Secondary | ICD-10-CM

## 2016-10-10 DIAGNOSIS — E7800 Pure hypercholesterolemia, unspecified: Secondary | ICD-10-CM | POA: Diagnosis not present

## 2016-10-10 DIAGNOSIS — I4891 Unspecified atrial fibrillation: Secondary | ICD-10-CM | POA: Insufficient documentation

## 2016-10-10 DIAGNOSIS — Z79899 Other long term (current) drug therapy: Secondary | ICD-10-CM | POA: Diagnosis not present

## 2016-10-10 DIAGNOSIS — E785 Hyperlipidemia, unspecified: Secondary | ICD-10-CM | POA: Insufficient documentation

## 2016-10-10 DIAGNOSIS — Y929 Unspecified place or not applicable: Secondary | ICD-10-CM | POA: Diagnosis not present

## 2016-10-10 DIAGNOSIS — I10 Essential (primary) hypertension: Secondary | ICD-10-CM | POA: Diagnosis not present

## 2016-10-10 DIAGNOSIS — Z8673 Personal history of transient ischemic attack (TIA), and cerebral infarction without residual deficits: Secondary | ICD-10-CM | POA: Diagnosis not present

## 2016-10-10 DIAGNOSIS — W06XXXA Fall from bed, initial encounter: Secondary | ICD-10-CM | POA: Diagnosis not present

## 2016-10-10 DIAGNOSIS — G8911 Acute pain due to trauma: Secondary | ICD-10-CM | POA: Diagnosis not present

## 2016-10-10 DIAGNOSIS — S0180XA Unspecified open wound of other part of head, initial encounter: Secondary | ICD-10-CM | POA: Diagnosis not present

## 2016-10-10 DIAGNOSIS — Y939 Activity, unspecified: Secondary | ICD-10-CM | POA: Insufficient documentation

## 2016-10-10 DIAGNOSIS — Y998 Other external cause status: Secondary | ICD-10-CM | POA: Insufficient documentation

## 2016-10-10 DIAGNOSIS — F17210 Nicotine dependence, cigarettes, uncomplicated: Secondary | ICD-10-CM | POA: Diagnosis not present

## 2016-10-10 NOTE — ED Provider Notes (Signed)
[facility] Provider Note   CSN: [id number] Arrival date & time: 10/10/16  0543     History   Chief Complaint Chief Complaint  Patient presents with  . Fall    on eliquis  LEVEL 5 CAVEAT DUE TO DEMENTIA   HPI Calvin Diaz is a 76 y.o. male.  The history is provided by the patient.  Fall  This is a new problem. Episode onset: just prior to arrival. The problem occurs constantly. The problem has not changed since onset.Associated symptoms include headaches. Pertinent negatives include no chest pain and no abdominal pain. Nothing aggravates the symptoms. Nothing relieves the symptoms.   Pt with h/o dementia, on anticoagulation for afib, presents s/p fall and hitting his head.  Apparently he "rolled out of bed"  He reports LOC and headache He denies any other complaints He has chronic weakness in left UE  Past Medical History:  Diagnosis Date  . Dementia   . High cholesterol   . Hypertension   . Myocardial infarction (HCC)   . Stroke [facility])     Patient Active Problem List   Diagnosis Date Noted  . Skin ulcers of foot, bilateral (HCC) 08/18/2016  . Gout 07/31/2016  . Seizures (HCC) 03/31/2016  . Dysphagia, post-stroke   . Tachycardia   . Leukocytosis   . Polyarticular arthritis   . Hypokalemia 12/14/2015  . OAB (overactive bladder) 09/20/2015  . Essential hypertension 09/07/2015  . Atrial fibrillation (HCC) 09/07/2015  . Dementia 09/07/2015  . History of CVA with residual deficit 08/18/2015  . Former smoker 08/18/2015  . History of alcohol abuse 08/18/2015  . Hyperlipidemia 08/18/2015    Past Surgical History:  Procedure Laterality Date  . CORONARY ANGIOPLASTY         Home Medications    Prior to Admission medications   Medication Sig Start Date End Date Taking? Authorizing Provider  apixaban (ELIQUIS) 5 MG TABS tablet Take 1 tablet (5 mg total) by mouth 2 (two) times daily. 06/13/16   Ronnald Nian, MD  citalopram (CELEXA) 20 MG tablet Take  1 tablet (20 mg total) by mouth daily. 06/13/16   Ronnald Nian, MD  colchicine 0.6 MG tablet Take 1 tablet (0.6 mg total) by mouth daily. 06/13/16   Ronnald Nian, MD  divalproex (DEPAKOTE SPRINKLE) 125 MG capsule Take 1 capsule (125 mg total) by mouth 4 (four) times daily. 05/10/16   Sharon Seller, NP  memantine (NAMENDA) 5 MG tablet Take 2 tablets (10 mg total) by mouth daily. 06/13/16   Ronnald Nian, MD  Multiple Vitamin (MULTIVITAMIN WITH MINERALS) TABS tablet Take 1 tablet by mouth daily.    [provider]  pravastatin (PRAVACHOL) 80 MG tablet Take 1 tablet (80 mg total) by mouth at bedtime. 05/10/16   Sharon Seller, NP    Family History Family History  Problem Relation Age of Onset  . Arrhythmia Mother        Had pacemaker  . Stroke Mother   . Stroke Maternal Aunt   . Heart attack Father     Social History Social History  Substance Use Topics  . Smoking status: Former Smoker    Types: Cigarettes  . Smokeless tobacco: Former Neurosurgeon    Quit date: 08/18/2015  . Alcohol use No     Comment: Previously daily     Allergies   Patient has no known allergies.   Review of Systems Review of Systems  Unable to perform ROS: Dementia  Cardiovascular:  Negative for chest pain.  Gastrointestinal: Negative for abdominal pain.  Neurological: Positive for headaches.     Physical Exam Updated Vital Signs BP 117/67   Pulse 63   Temp 97.8 F (36.6 C) (Oral)   Resp 16   SpO2 100%   Physical Exam CONSTITUTIONAL: Elderly, frail HEAD: mild soft tissue swelling to forehead, no other signs of trauma or stepoffs EYES: EOMI/PERRL ENMT: Mucous membranes moist NECK: supple no meningeal signs SPINE/BACK:entire spine nontender CV:  no murmurs/rubs/gallops noted LUNGS: Lungs are clear to auscultation bilaterally, no apparent distress ABDOMEN: soft, nontender  NEURO: Pt is awake/alert.  He is pleasant but appears confused.  He has chronic paresis of left  UE EXTREMITIES: pulses normal/equal, full ROM, chronic paresis of left UE, no deformities to extremities SKIN: warm, color normal    ED Treatments / Results  Labs (all labs ordered are listed, but only abnormal results are displayed) Labs Reviewed - No data to display  EKG  EKG Interpretation None       Radiology Ct Head Wo Contrast  Result Date: 10/10/2016 CLINICAL DATA:  Unwitnessed fall from bed. History of stroke, hypertension, dementia. Patient is on anticoagulation. EXAM: CT HEAD WITHOUT CONTRAST TECHNIQUE: Contiguous axial images were obtained from the base of the skull through the vertex without intravenous contrast. COMPARISON:  10/09/2016 FINDINGS: Brain: Diffuse cerebral atrophy. Ventricular dilatation consistent with central atrophy. Low-attenuation changes in the deep white matter consistent small vessel ischemia. Old infarcts in the right frontoparietal temporal region and in the left frontal region. No change since previous study. No mass effect or midline shift. No abnormal extra-axial fluid collections. Gray-white matter junctions are distinct. Basal cisterns are not effaced. Calcification along the falx. No acute intracranial hemorrhage. Vascular: Vascular calcifications in the internal carotid arteries. Skull: The calvarium appears intact. Sinuses/Orbits: Mucosal thickening in the paranasal sinuses. No acute air-fluid levels. Mastoid air cells are clear. Other: None. IMPRESSION: No acute intracranial abnormalities. Chronic atrophy and small vessel ischemic changes. Old bilateral cerebral infarcts. Electronically Signed   By: Burman Nieves M.D.   On: 10/10/2016 06:23   Ct Head Wo Contrast  Result Date: 10/09/2016 CLINICAL DATA:  Minor head trauma. EXAM: CT HEAD WITHOUT CONTRAST CT CERVICAL SPINE WITHOUT CONTRAST TECHNIQUE: Multidetector CT imaging of the head and cervical spine was performed following the standard protocol without intravenous contrast. Multiplanar CT  image reconstructions of the cervical spine were also generated. COMPARISON:  CT head 07/24/2016.  CT angio head and neck 12/15/2015 FINDINGS: CT HEAD FINDINGS Brain: Diffuse bilateral cerebral atrophy. Ventricular dilatation consistent with central atrophy. Low-attenuation changes in the deep white matter consistent small vessel ischemia. Large area of encephalomalacia involving the right frontotemporoparietal region consistent with old infarct in the distribution of the right middle cerebral artery. Old encephalomalacia in the left frontal lobe consistent with infarct in the left middle cerebral artery distribution. No change since previous study. No mass effect or midline shift. No abnormal extra-axial fluid collections. Gray-white matter junctions are distinct. Basal cisterns are not effaced. No acute intracranial hemorrhage. Vascular: Vascular calcifications in the internal carotid artery's. Skull: Calvarium appears intact. Sinuses/Orbits: Paranasal sinuses and mastoid air cells are clear. Other: No change since previous study. CT CERVICAL SPINE FINDINGS Alignment: Reversal of the usual cervical lordosis. This is unchanged since previous study in may be degenerative or due to patient positioning but ligamentous injury or muscle spasm could also have this appearance and are not excluded. No anterior subluxation. Normal alignment of the facet joints.  C1-2 articulation appears intact. Skull base and vertebrae: No vertebral compression deformities. No focal bone lesion or bone destruction. Bone cortex appears intact. Soft tissues and spinal canal: No prevertebral soft tissue swelling. No paraspinal infiltration. Vascular calcifications. Disc levels: Degenerative changes throughout the cervical spine with narrowed interspaces and associated endplate hypertrophic changes. Upper chest: Scarring in the lung apices. Other: None. IMPRESSION: 1. No acute intracranial abnormalities. Chronic atrophy and small vessel  ischemic changes. Old bilateral infarcts. 2. Nonspecific straightening of usual cervical lordosis. Degenerative changes throughout the cervical spine. No acute displaced fractures identified. Electronically Signed   By: Burman Nieves M.D.   On: 10/09/2016 01:30   Ct Cervical Spine Wo Contrast  Result Date: 10/09/2016 CLINICAL DATA:  Minor head trauma. EXAM: CT HEAD WITHOUT CONTRAST CT CERVICAL SPINE WITHOUT CONTRAST TECHNIQUE: Multidetector CT imaging of the head and cervical spine was performed following the standard protocol without intravenous contrast. Multiplanar CT image reconstructions of the cervical spine were also generated. COMPARISON:  CT head 07/24/2016.  CT angio head and neck 12/15/2015 FINDINGS: CT HEAD FINDINGS Brain: Diffuse bilateral cerebral atrophy. Ventricular dilatation consistent with central atrophy. Low-attenuation changes in the deep white matter consistent small vessel ischemia. Large area of encephalomalacia involving the right frontotemporoparietal region consistent with old infarct in the distribution of the right middle cerebral artery. Old encephalomalacia in the left frontal lobe consistent with infarct in the left middle cerebral artery distribution. No change since previous study. No mass effect or midline shift. No abnormal extra-axial fluid collections. Gray-white matter junctions are distinct. Basal cisterns are not effaced. No acute intracranial hemorrhage. Vascular: Vascular calcifications in the internal carotid artery's. Skull: Calvarium appears intact. Sinuses/Orbits: Paranasal sinuses and mastoid air cells are clear. Other: No change since previous study. CT CERVICAL SPINE FINDINGS Alignment: Reversal of the usual cervical lordosis. This is unchanged since previous study in may be degenerative or due to patient positioning but ligamentous injury or muscle spasm could also have this appearance and are not excluded. No anterior subluxation. Normal alignment of the  facet joints. C1-2 articulation appears intact. Skull base and vertebrae: No vertebral compression deformities. No focal bone lesion or bone destruction. Bone cortex appears intact. Soft tissues and spinal canal: No prevertebral soft tissue swelling. No paraspinal infiltration. Vascular calcifications. Disc levels: Degenerative changes throughout the cervical spine with narrowed interspaces and associated endplate hypertrophic changes. Upper chest: Scarring in the lung apices. Other: None. IMPRESSION: 1. No acute intracranial abnormalities. Chronic atrophy and small vessel ischemic changes. Old bilateral infarcts. 2. Nonspecific straightening of usual cervical lordosis. Degenerative changes throughout the cervical spine. No acute displaced fractures identified. Electronically Signed   By: Burman Nieves M.D.   On: 10/09/2016 01:30    Procedures Procedures (including critical care time)  Medications Ordered in ED Medications - No data to display   Initial Impression / Assessment and Plan / ED Course  I have reviewed the triage vital signs and the nursing notes.  Pertinent  imaging results that were available during my care of the patient were reviewed by me and considered in my medical decision making (see chart for details).     Ct head negative Pt appears at baseline He may benefit from stopping eliquis due to multiple falls and risk of ICH At this point, no traumatic injury He is appropriate for d/c back to facility   Final Clinical Impressions(s) / ED Diagnoses   Final diagnoses:  Fall, initial encounter  Injury of head, initial encounter  New Prescriptions New Prescriptions   No medications on file     Zadie Rhine, MD 10/10/16 [phone]

## 2016-10-10 NOTE — ED Triage Notes (Signed)
Pt presents with [facility] from [facility] with injuries as a result of an unwitnessed fall; [facility] staff reported to EMS that their last check on this patient was 0200 this morning- pt was in bed; pt was then checked at 0500am and pt was found lying in floor beside bed; pt struck forehead, small hematoma noted, no bleeding; pt alert and oriented at this time (person, DOB, situation); pt denies LOC; facility reports he takes eliquis

## 2016-10-12 DIAGNOSIS — I10 Essential (primary) hypertension: Secondary | ICD-10-CM | POA: Diagnosis not present

## 2016-10-12 DIAGNOSIS — I481 Persistent atrial fibrillation: Secondary | ICD-10-CM | POA: Diagnosis not present

## 2016-10-12 DIAGNOSIS — F0150 Vascular dementia without behavioral disturbance: Secondary | ICD-10-CM | POA: Diagnosis not present

## 2016-10-12 DIAGNOSIS — M159 Polyosteoarthritis, unspecified: Secondary | ICD-10-CM | POA: Diagnosis not present

## 2016-10-12 DIAGNOSIS — I69318 Other symptoms and signs involving cognitive functions following cerebral infarction: Secondary | ICD-10-CM | POA: Diagnosis not present

## 2016-10-12 DIAGNOSIS — I69354 Hemiplegia and hemiparesis following cerebral infarction affecting left non-dominant side: Secondary | ICD-10-CM | POA: Diagnosis not present

## 2016-10-13 ENCOUNTER — Telehealth: Payer: Self-pay

## 2016-10-13 NOTE — Telephone Encounter (Signed)
Wife stopped by the office to let us know that she is bringing the patient home from [facility] on Monday as they are not taking good care of him. They have let him fall 3 times in the past weekend and she is concerned for his safety there. She would like a new FL2 on Monday so she can place him somewhere else. She will discuss this further with you on Monday. I have already started [facility] for Monday.  Trixie Rude

## 2016-10-16 ENCOUNTER — Telehealth: Payer: Self-pay

## 2016-10-16 ENCOUNTER — Ambulatory Visit (INDEPENDENT_AMBULATORY_CARE_PROVIDER_SITE_OTHER): Payer: Medicare Other | Admitting: Family Medicine

## 2016-10-16 ENCOUNTER — Encounter: Payer: Self-pay | Admitting: Family Medicine

## 2016-10-16 VITALS — BP 132/60 | HR 64 | Resp 16 | Wt 136.2 lb

## 2016-10-16 DIAGNOSIS — R296 Repeated falls: Secondary | ICD-10-CM

## 2016-10-16 DIAGNOSIS — M109 Gout, unspecified: Secondary | ICD-10-CM

## 2016-10-16 DIAGNOSIS — I4819 Other persistent atrial fibrillation: Secondary | ICD-10-CM

## 2016-10-16 DIAGNOSIS — I6930 Unspecified sequelae of cerebral infarction: Secondary | ICD-10-CM | POA: Diagnosis not present

## 2016-10-16 DIAGNOSIS — F0150 Vascular dementia without behavioral disturbance: Secondary | ICD-10-CM | POA: Diagnosis not present

## 2016-10-16 DIAGNOSIS — I481 Persistent atrial fibrillation: Secondary | ICD-10-CM

## 2016-10-16 DIAGNOSIS — R569 Unspecified convulsions: Secondary | ICD-10-CM

## 2016-10-16 NOTE — Progress Notes (Signed)
   Subjective:    Patient ID: Calvin Diaz, male    DOB: [birth year], 76 y.o.   MRN: [id number]  HPI He is here for consult. His wife pulled him out of the nursing home stating he was not getting proper care. He apparently fell 3 times while in the nursing home. She recently took him home. He was seen in the emergency room and evaluated with no broken bones. Her concern was over the Eliquis that he was taking. Presently he has no concerns or complaints specifically chest pain, arm or leg discomfort, abdominal distress. He continues on his present medication regimen including Eliquis.  Review of Systems     Objective:   Physical Exam Alert and in no distress. Cardiac exam does show a regular rhythm. Lungs are clear to auscultation. Abdominal exam shows no masses or tenderness. No palpable tenderness to the chest wall, abdomen, pelvis arms or legs.       Assessment & Plan:  History of CVA with residual deficit  Vascular dementia without behavioral disturbance  Persistent atrial fibrillation (HCC)  Seizures (HCC)  Gout, unspecified cause, unspecified chronicity, unspecified site  Multiple falls She will be moving into a different manage care facility. I think under the circumstances, stopping Eliquis would be appropriate as he is at great risk for falls and potential damage from the Eliquis. She was comfortable with this.

## 2016-10-16 NOTE — Telephone Encounter (Signed)
NOTE FROM OV TODAY IS NOT COMPLETE.  Please fax referral with order to [facility] per pt wife request. Trixie Rude

## 2016-10-16 NOTE — Telephone Encounter (Signed)
ok 

## 2016-10-16 NOTE — Telephone Encounter (Signed)
Wife called requesting referral for home health for SN and PT from [facility]. Trixie Rude

## 2016-10-17 ENCOUNTER — Telehealth: Payer: Self-pay | Admitting: Family Medicine

## 2016-10-17 NOTE — Telephone Encounter (Signed)
Called [facility] and gave Elease Hashimoto the verbal

## 2016-10-17 NOTE — Telephone Encounter (Signed)
ok 

## 2016-10-17 NOTE — Telephone Encounter (Signed)
Faxed over referral info to [facility]- Attention: [place].   Fax # [phone] Phone # [phone]

## 2016-10-17 NOTE — Telephone Encounter (Signed)
Calvin Diaz with [facility] called for verbal orders for skilled nursing and physical therapy to Eval and treat.  Elease Hashimoto number [phone]

## 2016-10-18 ENCOUNTER — Telehealth: Payer: Self-pay | Admitting: Family Medicine

## 2016-10-18 ENCOUNTER — Other Ambulatory Visit: Payer: Self-pay | Admitting: Nurse Practitioner

## 2016-10-18 MED ORDER — PRAVASTATIN SODIUM 80 MG PO TABS: 80.0000 mg | ORAL_TABLET | Freq: Every day | ORAL | 2 refills | Status: DC

## 2016-10-18 NOTE — Telephone Encounter (Signed)
Wife called and wanted info on Home Health and I gave her info and also states pt needs refills on all his meds,   Called pharmacy [facility] and pt has refills on all his meds except Pravastatin.  Please refill Pravastatin to [facility]

## 2016-10-18 NOTE — Telephone Encounter (Signed)
Sent in med to pharmacy 

## 2016-10-22 DIAGNOSIS — I69318 Other symptoms and signs involving cognitive functions following cerebral infarction: Secondary | ICD-10-CM | POA: Diagnosis not present

## 2016-10-22 DIAGNOSIS — I69391 Dysphagia following cerebral infarction: Secondary | ICD-10-CM | POA: Diagnosis not present

## 2016-10-22 DIAGNOSIS — I10 Essential (primary) hypertension: Secondary | ICD-10-CM | POA: Diagnosis not present

## 2016-10-22 DIAGNOSIS — M109 Gout, unspecified: Secondary | ICD-10-CM | POA: Diagnosis not present

## 2016-10-22 DIAGNOSIS — I481 Persistent atrial fibrillation: Secondary | ICD-10-CM | POA: Diagnosis not present

## 2016-10-22 DIAGNOSIS — N3281 Overactive bladder: Secondary | ICD-10-CM | POA: Diagnosis not present

## 2016-10-22 DIAGNOSIS — I69354 Hemiplegia and hemiparesis following cerebral infarction affecting left non-dominant side: Secondary | ICD-10-CM | POA: Diagnosis not present

## 2016-10-22 DIAGNOSIS — Z87891 Personal history of nicotine dependence: Secondary | ICD-10-CM | POA: Diagnosis not present

## 2016-10-22 DIAGNOSIS — M159 Polyosteoarthritis, unspecified: Secondary | ICD-10-CM | POA: Diagnosis not present

## 2016-10-22 DIAGNOSIS — Z9181 History of falling: Secondary | ICD-10-CM | POA: Diagnosis not present

## 2016-10-22 DIAGNOSIS — R569 Unspecified convulsions: Secondary | ICD-10-CM | POA: Diagnosis not present

## 2016-10-22 DIAGNOSIS — R1310 Dysphagia, unspecified: Secondary | ICD-10-CM | POA: Diagnosis not present

## 2016-10-22 DIAGNOSIS — F0150 Vascular dementia without behavioral disturbance: Secondary | ICD-10-CM | POA: Diagnosis not present

## 2016-10-23 ENCOUNTER — Telehealth: Payer: Self-pay

## 2016-10-23 NOTE — Telephone Encounter (Signed)
I have faxed over to [facility] for request from wife for personal care services and to please advise. Fax # [phone]

## 2016-10-23 NOTE — Telephone Encounter (Signed)
Pt's wife Vikki Ports called to request personal care services. She said we can request this through [facility].  CB # [id number]. Trixie Rude

## 2016-10-23 NOTE — Telephone Encounter (Signed)
ok 

## 2016-10-25 ENCOUNTER — Telehealth: Payer: Self-pay | Admitting: Family Medicine

## 2016-10-25 NOTE — Telephone Encounter (Signed)
Nicolette was notified of verbal orders

## 2016-10-25 NOTE — Telephone Encounter (Signed)
Nicolette Brown called from [facility] requesting verbal orders for pt to have skilled nursing 1x per week for 3 weeks for disease mgmnt, home health 1x per wk for 2 weeks, OT - med social serv eval, PT 2x per week for 4 weeks. Call Nicolette back at [phone]

## 2016-10-25 NOTE — Telephone Encounter (Signed)
Okay 

## 2016-10-26 DIAGNOSIS — M159 Polyosteoarthritis, unspecified: Secondary | ICD-10-CM | POA: Diagnosis not present

## 2016-10-26 DIAGNOSIS — I69354 Hemiplegia and hemiparesis following cerebral infarction affecting left non-dominant side: Secondary | ICD-10-CM | POA: Diagnosis not present

## 2016-10-26 DIAGNOSIS — I10 Essential (primary) hypertension: Secondary | ICD-10-CM | POA: Diagnosis not present

## 2016-10-26 DIAGNOSIS — F0150 Vascular dementia without behavioral disturbance: Secondary | ICD-10-CM | POA: Diagnosis not present

## 2016-10-26 DIAGNOSIS — I481 Persistent atrial fibrillation: Secondary | ICD-10-CM | POA: Diagnosis not present

## 2016-10-26 DIAGNOSIS — I69318 Other symptoms and signs involving cognitive functions following cerebral infarction: Secondary | ICD-10-CM | POA: Diagnosis not present

## 2016-10-27 DIAGNOSIS — M159 Polyosteoarthritis, unspecified: Secondary | ICD-10-CM | POA: Diagnosis not present

## 2016-10-27 DIAGNOSIS — I69354 Hemiplegia and hemiparesis following cerebral infarction affecting left non-dominant side: Secondary | ICD-10-CM | POA: Diagnosis not present

## 2016-10-27 DIAGNOSIS — I481 Persistent atrial fibrillation: Secondary | ICD-10-CM | POA: Diagnosis not present

## 2016-10-27 DIAGNOSIS — F0150 Vascular dementia without behavioral disturbance: Secondary | ICD-10-CM | POA: Diagnosis not present

## 2016-10-27 DIAGNOSIS — I10 Essential (primary) hypertension: Secondary | ICD-10-CM | POA: Diagnosis not present

## 2016-10-27 DIAGNOSIS — I69318 Other symptoms and signs involving cognitive functions following cerebral infarction: Secondary | ICD-10-CM | POA: Diagnosis not present

## 2016-10-28 DIAGNOSIS — I10 Essential (primary) hypertension: Secondary | ICD-10-CM | POA: Diagnosis not present

## 2016-10-28 DIAGNOSIS — M159 Polyosteoarthritis, unspecified: Secondary | ICD-10-CM | POA: Diagnosis not present

## 2016-10-28 DIAGNOSIS — I69354 Hemiplegia and hemiparesis following cerebral infarction affecting left non-dominant side: Secondary | ICD-10-CM | POA: Diagnosis not present

## 2016-10-28 DIAGNOSIS — I69318 Other symptoms and signs involving cognitive functions following cerebral infarction: Secondary | ICD-10-CM | POA: Diagnosis not present

## 2016-10-28 DIAGNOSIS — I481 Persistent atrial fibrillation: Secondary | ICD-10-CM | POA: Diagnosis not present

## 2016-10-28 DIAGNOSIS — F0150 Vascular dementia without behavioral disturbance: Secondary | ICD-10-CM | POA: Diagnosis not present

## 2016-10-30 DIAGNOSIS — I69318 Other symptoms and signs involving cognitive functions following cerebral infarction: Secondary | ICD-10-CM | POA: Diagnosis not present

## 2016-10-30 DIAGNOSIS — I69354 Hemiplegia and hemiparesis following cerebral infarction affecting left non-dominant side: Secondary | ICD-10-CM | POA: Diagnosis not present

## 2016-10-30 DIAGNOSIS — I481 Persistent atrial fibrillation: Secondary | ICD-10-CM | POA: Diagnosis not present

## 2016-10-30 DIAGNOSIS — M159 Polyosteoarthritis, unspecified: Secondary | ICD-10-CM | POA: Diagnosis not present

## 2016-10-30 DIAGNOSIS — I10 Essential (primary) hypertension: Secondary | ICD-10-CM | POA: Diagnosis not present

## 2016-10-30 DIAGNOSIS — F0150 Vascular dementia without behavioral disturbance: Secondary | ICD-10-CM | POA: Diagnosis not present

## 2016-10-31 DIAGNOSIS — I10 Essential (primary) hypertension: Secondary | ICD-10-CM | POA: Diagnosis not present

## 2016-10-31 DIAGNOSIS — I69354 Hemiplegia and hemiparesis following cerebral infarction affecting left non-dominant side: Secondary | ICD-10-CM | POA: Diagnosis not present

## 2016-10-31 DIAGNOSIS — I69318 Other symptoms and signs involving cognitive functions following cerebral infarction: Secondary | ICD-10-CM | POA: Diagnosis not present

## 2016-10-31 DIAGNOSIS — F0150 Vascular dementia without behavioral disturbance: Secondary | ICD-10-CM | POA: Diagnosis not present

## 2016-10-31 DIAGNOSIS — I481 Persistent atrial fibrillation: Secondary | ICD-10-CM | POA: Diagnosis not present

## 2016-10-31 DIAGNOSIS — M159 Polyosteoarthritis, unspecified: Secondary | ICD-10-CM | POA: Diagnosis not present

## 2016-11-01 ENCOUNTER — Telehealth: Payer: Self-pay | Admitting: Family Medicine

## 2016-11-01 DIAGNOSIS — M159 Polyosteoarthritis, unspecified: Secondary | ICD-10-CM | POA: Diagnosis not present

## 2016-11-01 DIAGNOSIS — I10 Essential (primary) hypertension: Secondary | ICD-10-CM | POA: Diagnosis not present

## 2016-11-01 DIAGNOSIS — F0150 Vascular dementia without behavioral disturbance: Secondary | ICD-10-CM | POA: Diagnosis not present

## 2016-11-01 DIAGNOSIS — I69354 Hemiplegia and hemiparesis following cerebral infarction affecting left non-dominant side: Secondary | ICD-10-CM | POA: Diagnosis not present

## 2016-11-01 DIAGNOSIS — I481 Persistent atrial fibrillation: Secondary | ICD-10-CM | POA: Diagnosis not present

## 2016-11-01 DIAGNOSIS — I69318 Other symptoms and signs involving cognitive functions following cerebral infarction: Secondary | ICD-10-CM | POA: Diagnosis not present

## 2016-11-01 NOTE — Telephone Encounter (Signed)
Wife dropped off Independent Assessment for personal care services form in your folder for completion

## 2016-11-03 DIAGNOSIS — I69354 Hemiplegia and hemiparesis following cerebral infarction affecting left non-dominant side: Secondary | ICD-10-CM | POA: Diagnosis not present

## 2016-11-03 DIAGNOSIS — I481 Persistent atrial fibrillation: Secondary | ICD-10-CM | POA: Diagnosis not present

## 2016-11-03 DIAGNOSIS — F0150 Vascular dementia without behavioral disturbance: Secondary | ICD-10-CM | POA: Diagnosis not present

## 2016-11-03 DIAGNOSIS — I10 Essential (primary) hypertension: Secondary | ICD-10-CM | POA: Diagnosis not present

## 2016-11-03 DIAGNOSIS — I69318 Other symptoms and signs involving cognitive functions following cerebral infarction: Secondary | ICD-10-CM | POA: Diagnosis not present

## 2016-11-03 DIAGNOSIS — M159 Polyosteoarthritis, unspecified: Secondary | ICD-10-CM | POA: Diagnosis not present

## 2016-11-06 DIAGNOSIS — I69318 Other symptoms and signs involving cognitive functions following cerebral infarction: Secondary | ICD-10-CM | POA: Diagnosis not present

## 2016-11-06 DIAGNOSIS — F0150 Vascular dementia without behavioral disturbance: Secondary | ICD-10-CM | POA: Diagnosis not present

## 2016-11-06 DIAGNOSIS — I69354 Hemiplegia and hemiparesis following cerebral infarction affecting left non-dominant side: Secondary | ICD-10-CM | POA: Diagnosis not present

## 2016-11-06 DIAGNOSIS — M159 Polyosteoarthritis, unspecified: Secondary | ICD-10-CM | POA: Diagnosis not present

## 2016-11-06 DIAGNOSIS — I481 Persistent atrial fibrillation: Secondary | ICD-10-CM | POA: Diagnosis not present

## 2016-11-06 DIAGNOSIS — I10 Essential (primary) hypertension: Secondary | ICD-10-CM | POA: Diagnosis not present

## 2016-11-06 NOTE — Telephone Encounter (Signed)
Wife request for you to initial all 4 optional attestations in step 4 of form that was dropped off. She states his sundowners has gotten worse.

## 2016-11-06 NOTE — Telephone Encounter (Signed)
LM for wife to CB

## 2016-11-06 NOTE — Telephone Encounter (Signed)
Form faxed. Trixie Rude

## 2016-11-07 DIAGNOSIS — I481 Persistent atrial fibrillation: Secondary | ICD-10-CM | POA: Diagnosis not present

## 2016-11-07 DIAGNOSIS — M159 Polyosteoarthritis, unspecified: Secondary | ICD-10-CM | POA: Diagnosis not present

## 2016-11-07 DIAGNOSIS — F0150 Vascular dementia without behavioral disturbance: Secondary | ICD-10-CM | POA: Diagnosis not present

## 2016-11-07 DIAGNOSIS — I10 Essential (primary) hypertension: Secondary | ICD-10-CM | POA: Diagnosis not present

## 2016-11-07 DIAGNOSIS — I69354 Hemiplegia and hemiparesis following cerebral infarction affecting left non-dominant side: Secondary | ICD-10-CM | POA: Diagnosis not present

## 2016-11-07 DIAGNOSIS — I69318 Other symptoms and signs involving cognitive functions following cerebral infarction: Secondary | ICD-10-CM | POA: Diagnosis not present

## 2016-11-08 DIAGNOSIS — I481 Persistent atrial fibrillation: Secondary | ICD-10-CM | POA: Diagnosis not present

## 2016-11-08 DIAGNOSIS — I69354 Hemiplegia and hemiparesis following cerebral infarction affecting left non-dominant side: Secondary | ICD-10-CM | POA: Diagnosis not present

## 2016-11-08 DIAGNOSIS — I10 Essential (primary) hypertension: Secondary | ICD-10-CM | POA: Diagnosis not present

## 2016-11-08 DIAGNOSIS — I69318 Other symptoms and signs involving cognitive functions following cerebral infarction: Secondary | ICD-10-CM | POA: Diagnosis not present

## 2016-11-08 DIAGNOSIS — M159 Polyosteoarthritis, unspecified: Secondary | ICD-10-CM | POA: Diagnosis not present

## 2016-11-08 DIAGNOSIS — F0150 Vascular dementia without behavioral disturbance: Secondary | ICD-10-CM | POA: Diagnosis not present

## 2016-11-09 ENCOUNTER — Telehealth: Payer: Self-pay

## 2016-11-09 DIAGNOSIS — I69318 Other symptoms and signs involving cognitive functions following cerebral infarction: Secondary | ICD-10-CM | POA: Diagnosis not present

## 2016-11-09 DIAGNOSIS — I481 Persistent atrial fibrillation: Secondary | ICD-10-CM | POA: Diagnosis not present

## 2016-11-09 DIAGNOSIS — I69354 Hemiplegia and hemiparesis following cerebral infarction affecting left non-dominant side: Secondary | ICD-10-CM | POA: Diagnosis not present

## 2016-11-09 DIAGNOSIS — M159 Polyosteoarthritis, unspecified: Secondary | ICD-10-CM | POA: Diagnosis not present

## 2016-11-09 DIAGNOSIS — F0150 Vascular dementia without behavioral disturbance: Secondary | ICD-10-CM | POA: Diagnosis not present

## 2016-11-09 DIAGNOSIS — I10 Essential (primary) hypertension: Secondary | ICD-10-CM | POA: Diagnosis not present

## 2016-11-09 NOTE — Telephone Encounter (Signed)
[facility]'s [facility] was not [facility]. Called wife to confirm ID # [id number] R. Wife states per Ms. Ellerby [phone] states Medicaid is active. Confirmed with front desk to check medicaid status and it is not active.   Attempted call to Ms. Ellerby x2 LMTCB to f/u on the status of pt's medicaid as pt and his wife are actively looking for nursing home placement.

## 2016-11-10 DIAGNOSIS — I481 Persistent atrial fibrillation: Secondary | ICD-10-CM | POA: Diagnosis not present

## 2016-11-10 DIAGNOSIS — I69354 Hemiplegia and hemiparesis following cerebral infarction affecting left non-dominant side: Secondary | ICD-10-CM | POA: Diagnosis not present

## 2016-11-10 DIAGNOSIS — M159 Polyosteoarthritis, unspecified: Secondary | ICD-10-CM | POA: Diagnosis not present

## 2016-11-10 DIAGNOSIS — F0150 Vascular dementia without behavioral disturbance: Secondary | ICD-10-CM | POA: Diagnosis not present

## 2016-11-10 DIAGNOSIS — I10 Essential (primary) hypertension: Secondary | ICD-10-CM | POA: Diagnosis not present

## 2016-11-10 DIAGNOSIS — I69318 Other symptoms and signs involving cognitive functions following cerebral infarction: Secondary | ICD-10-CM | POA: Diagnosis not present

## 2016-11-13 DIAGNOSIS — I481 Persistent atrial fibrillation: Secondary | ICD-10-CM | POA: Diagnosis not present

## 2016-11-13 DIAGNOSIS — I10 Essential (primary) hypertension: Secondary | ICD-10-CM | POA: Diagnosis not present

## 2016-11-13 DIAGNOSIS — F0150 Vascular dementia without behavioral disturbance: Secondary | ICD-10-CM | POA: Diagnosis not present

## 2016-11-13 DIAGNOSIS — M159 Polyosteoarthritis, unspecified: Secondary | ICD-10-CM | POA: Diagnosis not present

## 2016-11-13 DIAGNOSIS — I69354 Hemiplegia and hemiparesis following cerebral infarction affecting left non-dominant side: Secondary | ICD-10-CM | POA: Diagnosis not present

## 2016-11-13 DIAGNOSIS — I69318 Other symptoms and signs involving cognitive functions following cerebral infarction: Secondary | ICD-10-CM | POA: Diagnosis not present

## 2016-11-14 ENCOUNTER — Telehealth: Payer: Self-pay | Admitting: Family Medicine

## 2016-11-14 NOTE — Telephone Encounter (Signed)
Wife called and states need new FL2 as last one expires tomorrow.  [facility] [phone]  Sent same

## 2016-11-15 DIAGNOSIS — M1000 Idiopathic gout, unspecified site: Secondary | ICD-10-CM | POA: Diagnosis not present

## 2016-11-15 DIAGNOSIS — R41841 Cognitive communication deficit: Secondary | ICD-10-CM | POA: Diagnosis not present

## 2016-11-15 DIAGNOSIS — R2689 Other abnormalities of gait and mobility: Secondary | ICD-10-CM | POA: Diagnosis not present

## 2016-11-15 DIAGNOSIS — R262 Difficulty in walking, not elsewhere classified: Secondary | ICD-10-CM | POA: Diagnosis not present

## 2016-11-15 DIAGNOSIS — F0280 Dementia in other diseases classified elsewhere without behavioral disturbance: Secondary | ICD-10-CM | POA: Diagnosis not present

## 2016-11-15 DIAGNOSIS — R569 Unspecified convulsions: Secondary | ICD-10-CM | POA: Diagnosis not present

## 2016-11-15 DIAGNOSIS — M6281 Muscle weakness (generalized): Secondary | ICD-10-CM | POA: Diagnosis not present

## 2016-11-16 DIAGNOSIS — M1000 Idiopathic gout, unspecified site: Secondary | ICD-10-CM | POA: Diagnosis not present

## 2016-11-16 DIAGNOSIS — M6281 Muscle weakness (generalized): Secondary | ICD-10-CM | POA: Diagnosis not present

## 2016-11-16 DIAGNOSIS — R569 Unspecified convulsions: Secondary | ICD-10-CM | POA: Diagnosis not present

## 2016-11-16 DIAGNOSIS — F0280 Dementia in other diseases classified elsewhere without behavioral disturbance: Secondary | ICD-10-CM | POA: Diagnosis not present

## 2016-11-16 DIAGNOSIS — R262 Difficulty in walking, not elsewhere classified: Secondary | ICD-10-CM | POA: Diagnosis not present

## 2016-11-16 DIAGNOSIS — E785 Hyperlipidemia, unspecified: Secondary | ICD-10-CM | POA: Diagnosis not present

## 2016-11-16 DIAGNOSIS — R2689 Other abnormalities of gait and mobility: Secondary | ICD-10-CM | POA: Diagnosis not present

## 2016-11-17 DIAGNOSIS — M1000 Idiopathic gout, unspecified site: Secondary | ICD-10-CM | POA: Diagnosis not present

## 2016-11-17 DIAGNOSIS — E785 Hyperlipidemia, unspecified: Secondary | ICD-10-CM | POA: Diagnosis not present

## 2016-11-17 DIAGNOSIS — F339 Major depressive disorder, recurrent, unspecified: Secondary | ICD-10-CM | POA: Diagnosis not present

## 2016-11-17 DIAGNOSIS — F0280 Dementia in other diseases classified elsewhere without behavioral disturbance: Secondary | ICD-10-CM | POA: Diagnosis not present

## 2016-11-17 DIAGNOSIS — R2689 Other abnormalities of gait and mobility: Secondary | ICD-10-CM | POA: Diagnosis not present

## 2016-11-17 DIAGNOSIS — R569 Unspecified convulsions: Secondary | ICD-10-CM | POA: Diagnosis not present

## 2016-11-17 DIAGNOSIS — M6281 Muscle weakness (generalized): Secondary | ICD-10-CM | POA: Diagnosis not present

## 2016-11-17 DIAGNOSIS — R262 Difficulty in walking, not elsewhere classified: Secondary | ICD-10-CM | POA: Diagnosis not present

## 2016-11-18 DIAGNOSIS — M1000 Idiopathic gout, unspecified site: Secondary | ICD-10-CM | POA: Diagnosis not present

## 2016-11-18 DIAGNOSIS — R262 Difficulty in walking, not elsewhere classified: Secondary | ICD-10-CM | POA: Diagnosis not present

## 2016-11-18 DIAGNOSIS — R2689 Other abnormalities of gait and mobility: Secondary | ICD-10-CM | POA: Diagnosis not present

## 2016-11-18 DIAGNOSIS — M6281 Muscle weakness (generalized): Secondary | ICD-10-CM | POA: Diagnosis not present

## 2016-11-18 DIAGNOSIS — F0280 Dementia in other diseases classified elsewhere without behavioral disturbance: Secondary | ICD-10-CM | POA: Diagnosis not present

## 2016-11-18 DIAGNOSIS — R569 Unspecified convulsions: Secondary | ICD-10-CM | POA: Diagnosis not present

## 2016-11-19 DIAGNOSIS — R2689 Other abnormalities of gait and mobility: Secondary | ICD-10-CM | POA: Diagnosis not present

## 2016-11-19 DIAGNOSIS — M6281 Muscle weakness (generalized): Secondary | ICD-10-CM | POA: Diagnosis not present

## 2016-11-19 DIAGNOSIS — R262 Difficulty in walking, not elsewhere classified: Secondary | ICD-10-CM | POA: Diagnosis not present

## 2016-11-19 DIAGNOSIS — R569 Unspecified convulsions: Secondary | ICD-10-CM | POA: Diagnosis not present

## 2016-11-19 DIAGNOSIS — F0280 Dementia in other diseases classified elsewhere without behavioral disturbance: Secondary | ICD-10-CM | POA: Diagnosis not present

## 2016-11-19 DIAGNOSIS — M1000 Idiopathic gout, unspecified site: Secondary | ICD-10-CM | POA: Diagnosis not present

## 2016-11-20 DIAGNOSIS — E785 Hyperlipidemia, unspecified: Secondary | ICD-10-CM | POA: Diagnosis not present

## 2016-11-20 DIAGNOSIS — E039 Hypothyroidism, unspecified: Secondary | ICD-10-CM | POA: Diagnosis not present

## 2016-11-20 DIAGNOSIS — F0280 Dementia in other diseases classified elsewhere without behavioral disturbance: Secondary | ICD-10-CM | POA: Diagnosis not present

## 2016-11-20 DIAGNOSIS — F0150 Vascular dementia without behavioral disturbance: Secondary | ICD-10-CM | POA: Diagnosis not present

## 2016-11-20 DIAGNOSIS — M1000 Idiopathic gout, unspecified site: Secondary | ICD-10-CM | POA: Diagnosis not present

## 2016-11-20 DIAGNOSIS — R569 Unspecified convulsions: Secondary | ICD-10-CM | POA: Diagnosis not present

## 2016-11-20 DIAGNOSIS — M6281 Muscle weakness (generalized): Secondary | ICD-10-CM | POA: Diagnosis not present

## 2016-11-20 DIAGNOSIS — F330 Major depressive disorder, recurrent, mild: Secondary | ICD-10-CM | POA: Diagnosis not present

## 2016-11-20 DIAGNOSIS — R2689 Other abnormalities of gait and mobility: Secondary | ICD-10-CM | POA: Diagnosis not present

## 2016-11-20 DIAGNOSIS — R262 Difficulty in walking, not elsewhere classified: Secondary | ICD-10-CM | POA: Diagnosis not present

## 2016-11-20 DIAGNOSIS — D649 Anemia, unspecified: Secondary | ICD-10-CM | POA: Diagnosis not present

## 2016-11-21 DIAGNOSIS — F0280 Dementia in other diseases classified elsewhere without behavioral disturbance: Secondary | ICD-10-CM | POA: Diagnosis not present

## 2016-11-21 DIAGNOSIS — M6281 Muscle weakness (generalized): Secondary | ICD-10-CM | POA: Diagnosis not present

## 2016-11-21 DIAGNOSIS — R262 Difficulty in walking, not elsewhere classified: Secondary | ICD-10-CM | POA: Diagnosis not present

## 2016-11-21 DIAGNOSIS — M1000 Idiopathic gout, unspecified site: Secondary | ICD-10-CM | POA: Diagnosis not present

## 2016-11-21 DIAGNOSIS — R2689 Other abnormalities of gait and mobility: Secondary | ICD-10-CM | POA: Diagnosis not present

## 2016-11-21 DIAGNOSIS — R569 Unspecified convulsions: Secondary | ICD-10-CM | POA: Diagnosis not present

## 2016-11-22 DIAGNOSIS — R2689 Other abnormalities of gait and mobility: Secondary | ICD-10-CM | POA: Diagnosis not present

## 2016-11-22 DIAGNOSIS — R569 Unspecified convulsions: Secondary | ICD-10-CM | POA: Diagnosis not present

## 2016-11-22 DIAGNOSIS — M1000 Idiopathic gout, unspecified site: Secondary | ICD-10-CM | POA: Diagnosis not present

## 2016-11-22 DIAGNOSIS — R262 Difficulty in walking, not elsewhere classified: Secondary | ICD-10-CM | POA: Diagnosis not present

## 2016-11-22 DIAGNOSIS — M6281 Muscle weakness (generalized): Secondary | ICD-10-CM | POA: Diagnosis not present

## 2016-11-22 DIAGNOSIS — F0280 Dementia in other diseases classified elsewhere without behavioral disturbance: Secondary | ICD-10-CM | POA: Diagnosis not present

## 2016-11-23 ENCOUNTER — Ambulatory Visit: Payer: Medicare Other | Admitting: Family Medicine

## 2016-11-23 DIAGNOSIS — F0280 Dementia in other diseases classified elsewhere without behavioral disturbance: Secondary | ICD-10-CM | POA: Diagnosis not present

## 2016-11-23 DIAGNOSIS — R569 Unspecified convulsions: Secondary | ICD-10-CM | POA: Diagnosis not present

## 2016-11-23 DIAGNOSIS — R2689 Other abnormalities of gait and mobility: Secondary | ICD-10-CM | POA: Diagnosis not present

## 2016-11-23 DIAGNOSIS — M1000 Idiopathic gout, unspecified site: Secondary | ICD-10-CM | POA: Diagnosis not present

## 2016-11-23 DIAGNOSIS — M6281 Muscle weakness (generalized): Secondary | ICD-10-CM | POA: Diagnosis not present

## 2016-11-23 DIAGNOSIS — R41841 Cognitive communication deficit: Secondary | ICD-10-CM | POA: Diagnosis not present

## 2016-11-23 DIAGNOSIS — R262 Difficulty in walking, not elsewhere classified: Secondary | ICD-10-CM | POA: Diagnosis not present

## 2016-11-24 DIAGNOSIS — F0280 Dementia in other diseases classified elsewhere without behavioral disturbance: Secondary | ICD-10-CM | POA: Diagnosis not present

## 2016-11-24 DIAGNOSIS — M6281 Muscle weakness (generalized): Secondary | ICD-10-CM | POA: Diagnosis not present

## 2016-11-24 DIAGNOSIS — R2689 Other abnormalities of gait and mobility: Secondary | ICD-10-CM | POA: Diagnosis not present

## 2016-11-24 DIAGNOSIS — F329 Major depressive disorder, single episode, unspecified: Secondary | ICD-10-CM | POA: Diagnosis not present

## 2016-11-24 DIAGNOSIS — R262 Difficulty in walking, not elsewhere classified: Secondary | ICD-10-CM | POA: Diagnosis not present

## 2016-11-24 DIAGNOSIS — F0390 Unspecified dementia without behavioral disturbance: Secondary | ICD-10-CM | POA: Diagnosis not present

## 2016-11-24 DIAGNOSIS — M1000 Idiopathic gout, unspecified site: Secondary | ICD-10-CM | POA: Diagnosis not present

## 2016-11-24 DIAGNOSIS — R569 Unspecified convulsions: Secondary | ICD-10-CM | POA: Diagnosis not present

## 2016-11-24 DIAGNOSIS — E785 Hyperlipidemia, unspecified: Secondary | ICD-10-CM | POA: Diagnosis not present

## 2016-11-26 DIAGNOSIS — R262 Difficulty in walking, not elsewhere classified: Secondary | ICD-10-CM | POA: Diagnosis not present

## 2016-11-26 DIAGNOSIS — F0280 Dementia in other diseases classified elsewhere without behavioral disturbance: Secondary | ICD-10-CM | POA: Diagnosis not present

## 2016-11-26 DIAGNOSIS — R569 Unspecified convulsions: Secondary | ICD-10-CM | POA: Diagnosis not present

## 2016-11-26 DIAGNOSIS — R2689 Other abnormalities of gait and mobility: Secondary | ICD-10-CM | POA: Diagnosis not present

## 2016-11-26 DIAGNOSIS — M6281 Muscle weakness (generalized): Secondary | ICD-10-CM | POA: Diagnosis not present

## 2016-11-26 DIAGNOSIS — M1000 Idiopathic gout, unspecified site: Secondary | ICD-10-CM | POA: Diagnosis not present

## 2016-11-27 DIAGNOSIS — M1000 Idiopathic gout, unspecified site: Secondary | ICD-10-CM | POA: Diagnosis not present

## 2016-11-27 DIAGNOSIS — M6281 Muscle weakness (generalized): Secondary | ICD-10-CM | POA: Diagnosis not present

## 2016-11-27 DIAGNOSIS — F0280 Dementia in other diseases classified elsewhere without behavioral disturbance: Secondary | ICD-10-CM | POA: Diagnosis not present

## 2016-11-27 DIAGNOSIS — R262 Difficulty in walking, not elsewhere classified: Secondary | ICD-10-CM | POA: Diagnosis not present

## 2016-11-27 DIAGNOSIS — R569 Unspecified convulsions: Secondary | ICD-10-CM | POA: Diagnosis not present

## 2016-11-27 DIAGNOSIS — R2689 Other abnormalities of gait and mobility: Secondary | ICD-10-CM | POA: Diagnosis not present

## 2016-11-28 DIAGNOSIS — R2689 Other abnormalities of gait and mobility: Secondary | ICD-10-CM | POA: Diagnosis not present

## 2016-11-28 DIAGNOSIS — R569 Unspecified convulsions: Secondary | ICD-10-CM | POA: Diagnosis not present

## 2016-11-28 DIAGNOSIS — F0280 Dementia in other diseases classified elsewhere without behavioral disturbance: Secondary | ICD-10-CM | POA: Diagnosis not present

## 2016-11-28 DIAGNOSIS — M1000 Idiopathic gout, unspecified site: Secondary | ICD-10-CM | POA: Diagnosis not present

## 2016-11-28 DIAGNOSIS — M6281 Muscle weakness (generalized): Secondary | ICD-10-CM | POA: Diagnosis not present

## 2016-11-28 DIAGNOSIS — R262 Difficulty in walking, not elsewhere classified: Secondary | ICD-10-CM | POA: Diagnosis not present

## 2016-11-29 DIAGNOSIS — M6281 Muscle weakness (generalized): Secondary | ICD-10-CM | POA: Diagnosis not present

## 2016-11-29 DIAGNOSIS — R262 Difficulty in walking, not elsewhere classified: Secondary | ICD-10-CM | POA: Diagnosis not present

## 2016-11-29 DIAGNOSIS — R569 Unspecified convulsions: Secondary | ICD-10-CM | POA: Diagnosis not present

## 2016-11-29 DIAGNOSIS — F0280 Dementia in other diseases classified elsewhere without behavioral disturbance: Secondary | ICD-10-CM | POA: Diagnosis not present

## 2016-11-29 DIAGNOSIS — R2689 Other abnormalities of gait and mobility: Secondary | ICD-10-CM | POA: Diagnosis not present

## 2016-11-29 DIAGNOSIS — M1000 Idiopathic gout, unspecified site: Secondary | ICD-10-CM | POA: Diagnosis not present

## 2016-11-30 DIAGNOSIS — M6281 Muscle weakness (generalized): Secondary | ICD-10-CM | POA: Diagnosis not present

## 2016-11-30 DIAGNOSIS — R569 Unspecified convulsions: Secondary | ICD-10-CM | POA: Diagnosis not present

## 2016-11-30 DIAGNOSIS — R262 Difficulty in walking, not elsewhere classified: Secondary | ICD-10-CM | POA: Diagnosis not present

## 2016-11-30 DIAGNOSIS — F0280 Dementia in other diseases classified elsewhere without behavioral disturbance: Secondary | ICD-10-CM | POA: Diagnosis not present

## 2016-11-30 DIAGNOSIS — R2689 Other abnormalities of gait and mobility: Secondary | ICD-10-CM | POA: Diagnosis not present

## 2016-11-30 DIAGNOSIS — M1000 Idiopathic gout, unspecified site: Secondary | ICD-10-CM | POA: Diagnosis not present

## 2016-12-01 DIAGNOSIS — R569 Unspecified convulsions: Secondary | ICD-10-CM | POA: Diagnosis not present

## 2016-12-01 DIAGNOSIS — M1000 Idiopathic gout, unspecified site: Secondary | ICD-10-CM | POA: Diagnosis not present

## 2016-12-01 DIAGNOSIS — M6281 Muscle weakness (generalized): Secondary | ICD-10-CM | POA: Diagnosis not present

## 2016-12-01 DIAGNOSIS — R262 Difficulty in walking, not elsewhere classified: Secondary | ICD-10-CM | POA: Diagnosis not present

## 2016-12-01 DIAGNOSIS — E785 Hyperlipidemia, unspecified: Secondary | ICD-10-CM | POA: Diagnosis not present

## 2016-12-01 DIAGNOSIS — F0280 Dementia in other diseases classified elsewhere without behavioral disturbance: Secondary | ICD-10-CM | POA: Diagnosis not present

## 2016-12-01 DIAGNOSIS — R2689 Other abnormalities of gait and mobility: Secondary | ICD-10-CM | POA: Diagnosis not present

## 2016-12-03 DIAGNOSIS — M6281 Muscle weakness (generalized): Secondary | ICD-10-CM | POA: Diagnosis not present

## 2016-12-03 DIAGNOSIS — M1000 Idiopathic gout, unspecified site: Secondary | ICD-10-CM | POA: Diagnosis not present

## 2016-12-03 DIAGNOSIS — R2689 Other abnormalities of gait and mobility: Secondary | ICD-10-CM | POA: Diagnosis not present

## 2016-12-03 DIAGNOSIS — R569 Unspecified convulsions: Secondary | ICD-10-CM | POA: Diagnosis not present

## 2016-12-03 DIAGNOSIS — F0280 Dementia in other diseases classified elsewhere without behavioral disturbance: Secondary | ICD-10-CM | POA: Diagnosis not present

## 2016-12-03 DIAGNOSIS — R262 Difficulty in walking, not elsewhere classified: Secondary | ICD-10-CM | POA: Diagnosis not present

## 2016-12-04 DIAGNOSIS — E119 Type 2 diabetes mellitus without complications: Secondary | ICD-10-CM | POA: Diagnosis not present

## 2016-12-04 DIAGNOSIS — D649 Anemia, unspecified: Secondary | ICD-10-CM | POA: Diagnosis not present

## 2016-12-04 DIAGNOSIS — M6281 Muscle weakness (generalized): Secondary | ICD-10-CM | POA: Diagnosis not present

## 2016-12-04 DIAGNOSIS — R262 Difficulty in walking, not elsewhere classified: Secondary | ICD-10-CM | POA: Diagnosis not present

## 2016-12-04 DIAGNOSIS — E0829 Diabetes mellitus due to underlying condition with other diabetic kidney complication: Secondary | ICD-10-CM | POA: Diagnosis not present

## 2016-12-04 DIAGNOSIS — F0280 Dementia in other diseases classified elsewhere without behavioral disturbance: Secondary | ICD-10-CM | POA: Diagnosis not present

## 2016-12-04 DIAGNOSIS — G309 Alzheimer's disease, unspecified: Secondary | ICD-10-CM | POA: Diagnosis not present

## 2016-12-04 DIAGNOSIS — R2689 Other abnormalities of gait and mobility: Secondary | ICD-10-CM | POA: Diagnosis not present

## 2016-12-04 DIAGNOSIS — M1000 Idiopathic gout, unspecified site: Secondary | ICD-10-CM | POA: Diagnosis not present

## 2016-12-04 DIAGNOSIS — R569 Unspecified convulsions: Secondary | ICD-10-CM | POA: Diagnosis not present

## 2016-12-04 DIAGNOSIS — E039 Hypothyroidism, unspecified: Secondary | ICD-10-CM | POA: Diagnosis not present

## 2016-12-05 DIAGNOSIS — R262 Difficulty in walking, not elsewhere classified: Secondary | ICD-10-CM | POA: Diagnosis not present

## 2016-12-05 DIAGNOSIS — M6281 Muscle weakness (generalized): Secondary | ICD-10-CM | POA: Diagnosis not present

## 2016-12-05 DIAGNOSIS — M1000 Idiopathic gout, unspecified site: Secondary | ICD-10-CM | POA: Diagnosis not present

## 2016-12-05 DIAGNOSIS — R569 Unspecified convulsions: Secondary | ICD-10-CM | POA: Diagnosis not present

## 2016-12-05 DIAGNOSIS — F0280 Dementia in other diseases classified elsewhere without behavioral disturbance: Secondary | ICD-10-CM | POA: Diagnosis not present

## 2016-12-05 DIAGNOSIS — R2689 Other abnormalities of gait and mobility: Secondary | ICD-10-CM | POA: Diagnosis not present

## 2016-12-06 DIAGNOSIS — M6281 Muscle weakness (generalized): Secondary | ICD-10-CM | POA: Diagnosis not present

## 2016-12-06 DIAGNOSIS — R569 Unspecified convulsions: Secondary | ICD-10-CM | POA: Diagnosis not present

## 2016-12-06 DIAGNOSIS — R262 Difficulty in walking, not elsewhere classified: Secondary | ICD-10-CM | POA: Diagnosis not present

## 2016-12-06 DIAGNOSIS — R2689 Other abnormalities of gait and mobility: Secondary | ICD-10-CM | POA: Diagnosis not present

## 2016-12-06 DIAGNOSIS — M1000 Idiopathic gout, unspecified site: Secondary | ICD-10-CM | POA: Diagnosis not present

## 2016-12-06 DIAGNOSIS — F0280 Dementia in other diseases classified elsewhere without behavioral disturbance: Secondary | ICD-10-CM | POA: Diagnosis not present

## 2016-12-07 DIAGNOSIS — M1000 Idiopathic gout, unspecified site: Secondary | ICD-10-CM | POA: Diagnosis not present

## 2016-12-07 DIAGNOSIS — F0280 Dementia in other diseases classified elsewhere without behavioral disturbance: Secondary | ICD-10-CM | POA: Diagnosis not present

## 2016-12-07 DIAGNOSIS — R2689 Other abnormalities of gait and mobility: Secondary | ICD-10-CM | POA: Diagnosis not present

## 2016-12-07 DIAGNOSIS — R569 Unspecified convulsions: Secondary | ICD-10-CM | POA: Diagnosis not present

## 2016-12-07 DIAGNOSIS — R262 Difficulty in walking, not elsewhere classified: Secondary | ICD-10-CM | POA: Diagnosis not present

## 2016-12-07 DIAGNOSIS — M6281 Muscle weakness (generalized): Secondary | ICD-10-CM | POA: Diagnosis not present

## 2016-12-08 DIAGNOSIS — F0280 Dementia in other diseases classified elsewhere without behavioral disturbance: Secondary | ICD-10-CM | POA: Diagnosis not present

## 2016-12-08 DIAGNOSIS — R569 Unspecified convulsions: Secondary | ICD-10-CM | POA: Diagnosis not present

## 2016-12-08 DIAGNOSIS — M1000 Idiopathic gout, unspecified site: Secondary | ICD-10-CM | POA: Diagnosis not present

## 2016-12-08 DIAGNOSIS — M6281 Muscle weakness (generalized): Secondary | ICD-10-CM | POA: Diagnosis not present

## 2016-12-08 DIAGNOSIS — R262 Difficulty in walking, not elsewhere classified: Secondary | ICD-10-CM | POA: Diagnosis not present

## 2016-12-08 DIAGNOSIS — R2689 Other abnormalities of gait and mobility: Secondary | ICD-10-CM | POA: Diagnosis not present

## 2016-12-09 DIAGNOSIS — R262 Difficulty in walking, not elsewhere classified: Secondary | ICD-10-CM | POA: Diagnosis not present

## 2016-12-09 DIAGNOSIS — R2689 Other abnormalities of gait and mobility: Secondary | ICD-10-CM | POA: Diagnosis not present

## 2016-12-09 DIAGNOSIS — M6281 Muscle weakness (generalized): Secondary | ICD-10-CM | POA: Diagnosis not present

## 2016-12-09 DIAGNOSIS — F0280 Dementia in other diseases classified elsewhere without behavioral disturbance: Secondary | ICD-10-CM | POA: Diagnosis not present

## 2016-12-09 DIAGNOSIS — R569 Unspecified convulsions: Secondary | ICD-10-CM | POA: Diagnosis not present

## 2016-12-09 DIAGNOSIS — M1000 Idiopathic gout, unspecified site: Secondary | ICD-10-CM | POA: Diagnosis not present

## 2016-12-10 DIAGNOSIS — R2689 Other abnormalities of gait and mobility: Secondary | ICD-10-CM | POA: Diagnosis not present

## 2016-12-10 DIAGNOSIS — R569 Unspecified convulsions: Secondary | ICD-10-CM | POA: Diagnosis not present

## 2016-12-10 DIAGNOSIS — M1000 Idiopathic gout, unspecified site: Secondary | ICD-10-CM | POA: Diagnosis not present

## 2016-12-10 DIAGNOSIS — R262 Difficulty in walking, not elsewhere classified: Secondary | ICD-10-CM | POA: Diagnosis not present

## 2016-12-10 DIAGNOSIS — F0280 Dementia in other diseases classified elsewhere without behavioral disturbance: Secondary | ICD-10-CM | POA: Diagnosis not present

## 2016-12-10 DIAGNOSIS — M6281 Muscle weakness (generalized): Secondary | ICD-10-CM | POA: Diagnosis not present

## 2016-12-11 DIAGNOSIS — R569 Unspecified convulsions: Secondary | ICD-10-CM | POA: Diagnosis not present

## 2016-12-11 DIAGNOSIS — M1000 Idiopathic gout, unspecified site: Secondary | ICD-10-CM | POA: Diagnosis not present

## 2016-12-11 DIAGNOSIS — R296 Repeated falls: Secondary | ICD-10-CM | POA: Diagnosis not present

## 2016-12-11 DIAGNOSIS — M6281 Muscle weakness (generalized): Secondary | ICD-10-CM | POA: Diagnosis not present

## 2016-12-11 DIAGNOSIS — F0280 Dementia in other diseases classified elsewhere without behavioral disturbance: Secondary | ICD-10-CM | POA: Diagnosis not present

## 2016-12-11 DIAGNOSIS — E785 Hyperlipidemia, unspecified: Secondary | ICD-10-CM | POA: Diagnosis not present

## 2016-12-11 DIAGNOSIS — F339 Major depressive disorder, recurrent, unspecified: Secondary | ICD-10-CM | POA: Diagnosis not present

## 2016-12-11 DIAGNOSIS — R262 Difficulty in walking, not elsewhere classified: Secondary | ICD-10-CM | POA: Diagnosis not present

## 2016-12-11 DIAGNOSIS — R2689 Other abnormalities of gait and mobility: Secondary | ICD-10-CM | POA: Diagnosis not present

## 2016-12-12 DIAGNOSIS — F0280 Dementia in other diseases classified elsewhere without behavioral disturbance: Secondary | ICD-10-CM | POA: Diagnosis not present

## 2016-12-12 DIAGNOSIS — R569 Unspecified convulsions: Secondary | ICD-10-CM | POA: Diagnosis not present

## 2016-12-12 DIAGNOSIS — M6281 Muscle weakness (generalized): Secondary | ICD-10-CM | POA: Diagnosis not present

## 2016-12-12 DIAGNOSIS — M1000 Idiopathic gout, unspecified site: Secondary | ICD-10-CM | POA: Diagnosis not present

## 2016-12-12 DIAGNOSIS — R262 Difficulty in walking, not elsewhere classified: Secondary | ICD-10-CM | POA: Diagnosis not present

## 2016-12-12 DIAGNOSIS — R2689 Other abnormalities of gait and mobility: Secondary | ICD-10-CM | POA: Diagnosis not present

## 2016-12-13 DIAGNOSIS — R262 Difficulty in walking, not elsewhere classified: Secondary | ICD-10-CM | POA: Diagnosis not present

## 2016-12-13 DIAGNOSIS — R569 Unspecified convulsions: Secondary | ICD-10-CM | POA: Diagnosis not present

## 2016-12-13 DIAGNOSIS — F0280 Dementia in other diseases classified elsewhere without behavioral disturbance: Secondary | ICD-10-CM | POA: Diagnosis not present

## 2016-12-13 DIAGNOSIS — E785 Hyperlipidemia, unspecified: Secondary | ICD-10-CM | POA: Diagnosis not present

## 2016-12-13 DIAGNOSIS — R2689 Other abnormalities of gait and mobility: Secondary | ICD-10-CM | POA: Diagnosis not present

## 2016-12-13 DIAGNOSIS — M1000 Idiopathic gout, unspecified site: Secondary | ICD-10-CM | POA: Diagnosis not present

## 2016-12-13 DIAGNOSIS — M6281 Muscle weakness (generalized): Secondary | ICD-10-CM | POA: Diagnosis not present

## 2016-12-14 DIAGNOSIS — M1000 Idiopathic gout, unspecified site: Secondary | ICD-10-CM | POA: Diagnosis not present

## 2016-12-14 DIAGNOSIS — F0280 Dementia in other diseases classified elsewhere without behavioral disturbance: Secondary | ICD-10-CM | POA: Diagnosis not present

## 2016-12-14 DIAGNOSIS — M6281 Muscle weakness (generalized): Secondary | ICD-10-CM | POA: Diagnosis not present

## 2016-12-14 DIAGNOSIS — R2689 Other abnormalities of gait and mobility: Secondary | ICD-10-CM | POA: Diagnosis not present

## 2016-12-14 DIAGNOSIS — R569 Unspecified convulsions: Secondary | ICD-10-CM | POA: Diagnosis not present

## 2016-12-14 DIAGNOSIS — R262 Difficulty in walking, not elsewhere classified: Secondary | ICD-10-CM | POA: Diagnosis not present

## 2016-12-15 DIAGNOSIS — R2689 Other abnormalities of gait and mobility: Secondary | ICD-10-CM | POA: Diagnosis not present

## 2016-12-15 DIAGNOSIS — R569 Unspecified convulsions: Secondary | ICD-10-CM | POA: Diagnosis not present

## 2016-12-15 DIAGNOSIS — M6281 Muscle weakness (generalized): Secondary | ICD-10-CM | POA: Diagnosis not present

## 2016-12-15 DIAGNOSIS — M1000 Idiopathic gout, unspecified site: Secondary | ICD-10-CM | POA: Diagnosis not present

## 2016-12-15 DIAGNOSIS — F0280 Dementia in other diseases classified elsewhere without behavioral disturbance: Secondary | ICD-10-CM | POA: Diagnosis not present

## 2016-12-15 DIAGNOSIS — R262 Difficulty in walking, not elsewhere classified: Secondary | ICD-10-CM | POA: Diagnosis not present

## 2016-12-16 DIAGNOSIS — F0280 Dementia in other diseases classified elsewhere without behavioral disturbance: Secondary | ICD-10-CM | POA: Diagnosis not present

## 2016-12-16 DIAGNOSIS — M1000 Idiopathic gout, unspecified site: Secondary | ICD-10-CM | POA: Diagnosis not present

## 2016-12-16 DIAGNOSIS — R2689 Other abnormalities of gait and mobility: Secondary | ICD-10-CM | POA: Diagnosis not present

## 2016-12-16 DIAGNOSIS — M6281 Muscle weakness (generalized): Secondary | ICD-10-CM | POA: Diagnosis not present

## 2016-12-16 DIAGNOSIS — R569 Unspecified convulsions: Secondary | ICD-10-CM | POA: Diagnosis not present

## 2016-12-16 DIAGNOSIS — R262 Difficulty in walking, not elsewhere classified: Secondary | ICD-10-CM | POA: Diagnosis not present

## 2016-12-18 DIAGNOSIS — R2689 Other abnormalities of gait and mobility: Secondary | ICD-10-CM | POA: Diagnosis not present

## 2016-12-18 DIAGNOSIS — M1000 Idiopathic gout, unspecified site: Secondary | ICD-10-CM | POA: Diagnosis not present

## 2016-12-18 DIAGNOSIS — E785 Hyperlipidemia, unspecified: Secondary | ICD-10-CM | POA: Diagnosis not present

## 2016-12-18 DIAGNOSIS — M6281 Muscle weakness (generalized): Secondary | ICD-10-CM | POA: Diagnosis not present

## 2016-12-18 DIAGNOSIS — F0280 Dementia in other diseases classified elsewhere without behavioral disturbance: Secondary | ICD-10-CM | POA: Diagnosis not present

## 2016-12-18 DIAGNOSIS — R262 Difficulty in walking, not elsewhere classified: Secondary | ICD-10-CM | POA: Diagnosis not present

## 2016-12-18 DIAGNOSIS — R569 Unspecified convulsions: Secondary | ICD-10-CM | POA: Diagnosis not present

## 2016-12-18 DIAGNOSIS — R296 Repeated falls: Secondary | ICD-10-CM | POA: Diagnosis not present

## 2016-12-19 DIAGNOSIS — R2689 Other abnormalities of gait and mobility: Secondary | ICD-10-CM | POA: Diagnosis not present

## 2016-12-19 DIAGNOSIS — M6281 Muscle weakness (generalized): Secondary | ICD-10-CM | POA: Diagnosis not present

## 2016-12-19 DIAGNOSIS — R262 Difficulty in walking, not elsewhere classified: Secondary | ICD-10-CM | POA: Diagnosis not present

## 2016-12-19 DIAGNOSIS — F0280 Dementia in other diseases classified elsewhere without behavioral disturbance: Secondary | ICD-10-CM | POA: Diagnosis not present

## 2016-12-19 DIAGNOSIS — R569 Unspecified convulsions: Secondary | ICD-10-CM | POA: Diagnosis not present

## 2016-12-19 DIAGNOSIS — M1000 Idiopathic gout, unspecified site: Secondary | ICD-10-CM | POA: Diagnosis not present

## 2016-12-20 DIAGNOSIS — M1000 Idiopathic gout, unspecified site: Secondary | ICD-10-CM | POA: Diagnosis not present

## 2016-12-20 DIAGNOSIS — R2689 Other abnormalities of gait and mobility: Secondary | ICD-10-CM | POA: Diagnosis not present

## 2016-12-20 DIAGNOSIS — R569 Unspecified convulsions: Secondary | ICD-10-CM | POA: Diagnosis not present

## 2016-12-20 DIAGNOSIS — M6281 Muscle weakness (generalized): Secondary | ICD-10-CM | POA: Diagnosis not present

## 2016-12-20 DIAGNOSIS — R262 Difficulty in walking, not elsewhere classified: Secondary | ICD-10-CM | POA: Diagnosis not present

## 2016-12-20 DIAGNOSIS — F0280 Dementia in other diseases classified elsewhere without behavioral disturbance: Secondary | ICD-10-CM | POA: Diagnosis not present

## 2016-12-21 DIAGNOSIS — R2689 Other abnormalities of gait and mobility: Secondary | ICD-10-CM | POA: Diagnosis not present

## 2016-12-21 DIAGNOSIS — F0280 Dementia in other diseases classified elsewhere without behavioral disturbance: Secondary | ICD-10-CM | POA: Diagnosis not present

## 2016-12-21 DIAGNOSIS — M1000 Idiopathic gout, unspecified site: Secondary | ICD-10-CM | POA: Diagnosis not present

## 2016-12-21 DIAGNOSIS — R569 Unspecified convulsions: Secondary | ICD-10-CM | POA: Diagnosis not present

## 2016-12-21 DIAGNOSIS — R262 Difficulty in walking, not elsewhere classified: Secondary | ICD-10-CM | POA: Diagnosis not present

## 2016-12-21 DIAGNOSIS — M6281 Muscle weakness (generalized): Secondary | ICD-10-CM | POA: Diagnosis not present

## 2016-12-22 DIAGNOSIS — R262 Difficulty in walking, not elsewhere classified: Secondary | ICD-10-CM | POA: Diagnosis not present

## 2016-12-22 DIAGNOSIS — M1000 Idiopathic gout, unspecified site: Secondary | ICD-10-CM | POA: Diagnosis not present

## 2016-12-22 DIAGNOSIS — R569 Unspecified convulsions: Secondary | ICD-10-CM | POA: Diagnosis not present

## 2016-12-22 DIAGNOSIS — F0280 Dementia in other diseases classified elsewhere without behavioral disturbance: Secondary | ICD-10-CM | POA: Diagnosis not present

## 2016-12-22 DIAGNOSIS — R2689 Other abnormalities of gait and mobility: Secondary | ICD-10-CM | POA: Diagnosis not present

## 2016-12-22 DIAGNOSIS — M6281 Muscle weakness (generalized): Secondary | ICD-10-CM | POA: Diagnosis not present

## 2016-12-24 DIAGNOSIS — F0280 Dementia in other diseases classified elsewhere without behavioral disturbance: Secondary | ICD-10-CM | POA: Diagnosis not present

## 2016-12-24 DIAGNOSIS — R262 Difficulty in walking, not elsewhere classified: Secondary | ICD-10-CM | POA: Diagnosis not present

## 2016-12-24 DIAGNOSIS — R569 Unspecified convulsions: Secondary | ICD-10-CM | POA: Diagnosis not present

## 2016-12-24 DIAGNOSIS — R2689 Other abnormalities of gait and mobility: Secondary | ICD-10-CM | POA: Diagnosis not present

## 2016-12-24 DIAGNOSIS — R41841 Cognitive communication deficit: Secondary | ICD-10-CM | POA: Diagnosis not present

## 2016-12-24 DIAGNOSIS — M6281 Muscle weakness (generalized): Secondary | ICD-10-CM | POA: Diagnosis not present

## 2016-12-24 DIAGNOSIS — M1000 Idiopathic gout, unspecified site: Secondary | ICD-10-CM | POA: Diagnosis not present

## 2016-12-25 DIAGNOSIS — R262 Difficulty in walking, not elsewhere classified: Secondary | ICD-10-CM | POA: Diagnosis not present

## 2016-12-25 DIAGNOSIS — R2689 Other abnormalities of gait and mobility: Secondary | ICD-10-CM | POA: Diagnosis not present

## 2016-12-25 DIAGNOSIS — R569 Unspecified convulsions: Secondary | ICD-10-CM | POA: Diagnosis not present

## 2016-12-25 DIAGNOSIS — M6281 Muscle weakness (generalized): Secondary | ICD-10-CM | POA: Diagnosis not present

## 2016-12-25 DIAGNOSIS — M1000 Idiopathic gout, unspecified site: Secondary | ICD-10-CM | POA: Diagnosis not present

## 2016-12-25 DIAGNOSIS — F0280 Dementia in other diseases classified elsewhere without behavioral disturbance: Secondary | ICD-10-CM | POA: Diagnosis not present

## 2016-12-26 DIAGNOSIS — M6281 Muscle weakness (generalized): Secondary | ICD-10-CM | POA: Diagnosis not present

## 2016-12-26 DIAGNOSIS — R2689 Other abnormalities of gait and mobility: Secondary | ICD-10-CM | POA: Diagnosis not present

## 2016-12-26 DIAGNOSIS — F0280 Dementia in other diseases classified elsewhere without behavioral disturbance: Secondary | ICD-10-CM | POA: Diagnosis not present

## 2016-12-26 DIAGNOSIS — M1000 Idiopathic gout, unspecified site: Secondary | ICD-10-CM | POA: Diagnosis not present

## 2016-12-26 DIAGNOSIS — R262 Difficulty in walking, not elsewhere classified: Secondary | ICD-10-CM | POA: Diagnosis not present

## 2016-12-26 DIAGNOSIS — R569 Unspecified convulsions: Secondary | ICD-10-CM | POA: Diagnosis not present

## 2016-12-27 DIAGNOSIS — F0280 Dementia in other diseases classified elsewhere without behavioral disturbance: Secondary | ICD-10-CM | POA: Diagnosis not present

## 2016-12-27 DIAGNOSIS — M1000 Idiopathic gout, unspecified site: Secondary | ICD-10-CM | POA: Diagnosis not present

## 2016-12-27 DIAGNOSIS — R2689 Other abnormalities of gait and mobility: Secondary | ICD-10-CM | POA: Diagnosis not present

## 2016-12-27 DIAGNOSIS — R569 Unspecified convulsions: Secondary | ICD-10-CM | POA: Diagnosis not present

## 2016-12-27 DIAGNOSIS — M6281 Muscle weakness (generalized): Secondary | ICD-10-CM | POA: Diagnosis not present

## 2016-12-27 DIAGNOSIS — R262 Difficulty in walking, not elsewhere classified: Secondary | ICD-10-CM | POA: Diagnosis not present

## 2016-12-28 DIAGNOSIS — M1000 Idiopathic gout, unspecified site: Secondary | ICD-10-CM | POA: Diagnosis not present

## 2016-12-28 DIAGNOSIS — R569 Unspecified convulsions: Secondary | ICD-10-CM | POA: Diagnosis not present

## 2016-12-28 DIAGNOSIS — M6281 Muscle weakness (generalized): Secondary | ICD-10-CM | POA: Diagnosis not present

## 2016-12-28 DIAGNOSIS — F0280 Dementia in other diseases classified elsewhere without behavioral disturbance: Secondary | ICD-10-CM | POA: Diagnosis not present

## 2016-12-28 DIAGNOSIS — R2689 Other abnormalities of gait and mobility: Secondary | ICD-10-CM | POA: Diagnosis not present

## 2016-12-28 DIAGNOSIS — R262 Difficulty in walking, not elsewhere classified: Secondary | ICD-10-CM | POA: Diagnosis not present

## 2017-01-02 DIAGNOSIS — R569 Unspecified convulsions: Secondary | ICD-10-CM | POA: Diagnosis not present

## 2017-01-02 DIAGNOSIS — F0280 Dementia in other diseases classified elsewhere without behavioral disturbance: Secondary | ICD-10-CM | POA: Diagnosis not present

## 2017-01-02 DIAGNOSIS — R262 Difficulty in walking, not elsewhere classified: Secondary | ICD-10-CM | POA: Diagnosis not present

## 2017-01-02 DIAGNOSIS — R2689 Other abnormalities of gait and mobility: Secondary | ICD-10-CM | POA: Diagnosis not present

## 2017-01-02 DIAGNOSIS — M1000 Idiopathic gout, unspecified site: Secondary | ICD-10-CM | POA: Diagnosis not present

## 2017-01-02 DIAGNOSIS — M6281 Muscle weakness (generalized): Secondary | ICD-10-CM | POA: Diagnosis not present

## 2017-01-03 DIAGNOSIS — R569 Unspecified convulsions: Secondary | ICD-10-CM | POA: Diagnosis not present

## 2017-01-03 DIAGNOSIS — M1000 Idiopathic gout, unspecified site: Secondary | ICD-10-CM | POA: Diagnosis not present

## 2017-01-03 DIAGNOSIS — F0280 Dementia in other diseases classified elsewhere without behavioral disturbance: Secondary | ICD-10-CM | POA: Diagnosis not present

## 2017-01-03 DIAGNOSIS — R262 Difficulty in walking, not elsewhere classified: Secondary | ICD-10-CM | POA: Diagnosis not present

## 2017-01-03 DIAGNOSIS — R2689 Other abnormalities of gait and mobility: Secondary | ICD-10-CM | POA: Diagnosis not present

## 2017-01-03 DIAGNOSIS — M6281 Muscle weakness (generalized): Secondary | ICD-10-CM | POA: Diagnosis not present

## 2017-01-04 DIAGNOSIS — R569 Unspecified convulsions: Secondary | ICD-10-CM | POA: Diagnosis not present

## 2017-01-04 DIAGNOSIS — F0280 Dementia in other diseases classified elsewhere without behavioral disturbance: Secondary | ICD-10-CM | POA: Diagnosis not present

## 2017-01-04 DIAGNOSIS — M6281 Muscle weakness (generalized): Secondary | ICD-10-CM | POA: Diagnosis not present

## 2017-01-04 DIAGNOSIS — R2689 Other abnormalities of gait and mobility: Secondary | ICD-10-CM | POA: Diagnosis not present

## 2017-01-04 DIAGNOSIS — R262 Difficulty in walking, not elsewhere classified: Secondary | ICD-10-CM | POA: Diagnosis not present

## 2017-01-04 DIAGNOSIS — M1000 Idiopathic gout, unspecified site: Secondary | ICD-10-CM | POA: Diagnosis not present

## 2017-01-05 DIAGNOSIS — M1000 Idiopathic gout, unspecified site: Secondary | ICD-10-CM | POA: Diagnosis not present

## 2017-01-05 DIAGNOSIS — R569 Unspecified convulsions: Secondary | ICD-10-CM | POA: Diagnosis not present

## 2017-01-05 DIAGNOSIS — R2689 Other abnormalities of gait and mobility: Secondary | ICD-10-CM | POA: Diagnosis not present

## 2017-01-05 DIAGNOSIS — R262 Difficulty in walking, not elsewhere classified: Secondary | ICD-10-CM | POA: Diagnosis not present

## 2017-01-05 DIAGNOSIS — F0280 Dementia in other diseases classified elsewhere without behavioral disturbance: Secondary | ICD-10-CM | POA: Diagnosis not present

## 2017-01-05 DIAGNOSIS — M6281 Muscle weakness (generalized): Secondary | ICD-10-CM | POA: Diagnosis not present

## 2017-01-06 DIAGNOSIS — R2689 Other abnormalities of gait and mobility: Secondary | ICD-10-CM | POA: Diagnosis not present

## 2017-01-06 DIAGNOSIS — R262 Difficulty in walking, not elsewhere classified: Secondary | ICD-10-CM | POA: Diagnosis not present

## 2017-01-06 DIAGNOSIS — R569 Unspecified convulsions: Secondary | ICD-10-CM | POA: Diagnosis not present

## 2017-01-06 DIAGNOSIS — F0280 Dementia in other diseases classified elsewhere without behavioral disturbance: Secondary | ICD-10-CM | POA: Diagnosis not present

## 2017-01-06 DIAGNOSIS — M6281 Muscle weakness (generalized): Secondary | ICD-10-CM | POA: Diagnosis not present

## 2017-01-06 DIAGNOSIS — M1000 Idiopathic gout, unspecified site: Secondary | ICD-10-CM | POA: Diagnosis not present

## 2017-01-08 DIAGNOSIS — M1000 Idiopathic gout, unspecified site: Secondary | ICD-10-CM | POA: Diagnosis not present

## 2017-01-08 DIAGNOSIS — R2689 Other abnormalities of gait and mobility: Secondary | ICD-10-CM | POA: Diagnosis not present

## 2017-01-08 DIAGNOSIS — F0280 Dementia in other diseases classified elsewhere without behavioral disturbance: Secondary | ICD-10-CM | POA: Diagnosis not present

## 2017-01-08 DIAGNOSIS — R569 Unspecified convulsions: Secondary | ICD-10-CM | POA: Diagnosis not present

## 2017-01-08 DIAGNOSIS — M6281 Muscle weakness (generalized): Secondary | ICD-10-CM | POA: Diagnosis not present

## 2017-01-08 DIAGNOSIS — R262 Difficulty in walking, not elsewhere classified: Secondary | ICD-10-CM | POA: Diagnosis not present

## 2017-01-09 DIAGNOSIS — R296 Repeated falls: Secondary | ICD-10-CM | POA: Diagnosis not present

## 2017-01-09 DIAGNOSIS — R262 Difficulty in walking, not elsewhere classified: Secondary | ICD-10-CM | POA: Diagnosis not present

## 2017-01-09 DIAGNOSIS — M6281 Muscle weakness (generalized): Secondary | ICD-10-CM | POA: Diagnosis not present

## 2017-01-09 DIAGNOSIS — R5601 Complex febrile convulsions: Secondary | ICD-10-CM | POA: Diagnosis not present

## 2017-01-09 DIAGNOSIS — M1000 Idiopathic gout, unspecified site: Secondary | ICD-10-CM | POA: Diagnosis not present

## 2017-01-09 DIAGNOSIS — F0280 Dementia in other diseases classified elsewhere without behavioral disturbance: Secondary | ICD-10-CM | POA: Diagnosis not present

## 2017-01-09 DIAGNOSIS — R2689 Other abnormalities of gait and mobility: Secondary | ICD-10-CM | POA: Diagnosis not present

## 2017-01-09 DIAGNOSIS — R569 Unspecified convulsions: Secondary | ICD-10-CM | POA: Diagnosis not present

## 2017-01-10 DIAGNOSIS — R569 Unspecified convulsions: Secondary | ICD-10-CM | POA: Diagnosis not present

## 2017-01-10 DIAGNOSIS — M6281 Muscle weakness (generalized): Secondary | ICD-10-CM | POA: Diagnosis not present

## 2017-01-10 DIAGNOSIS — F0280 Dementia in other diseases classified elsewhere without behavioral disturbance: Secondary | ICD-10-CM | POA: Diagnosis not present

## 2017-01-10 DIAGNOSIS — R2689 Other abnormalities of gait and mobility: Secondary | ICD-10-CM | POA: Diagnosis not present

## 2017-01-10 DIAGNOSIS — M1000 Idiopathic gout, unspecified site: Secondary | ICD-10-CM | POA: Diagnosis not present

## 2017-01-10 DIAGNOSIS — R262 Difficulty in walking, not elsewhere classified: Secondary | ICD-10-CM | POA: Diagnosis not present

## 2017-01-11 DIAGNOSIS — F0280 Dementia in other diseases classified elsewhere without behavioral disturbance: Secondary | ICD-10-CM | POA: Diagnosis not present

## 2017-01-11 DIAGNOSIS — R569 Unspecified convulsions: Secondary | ICD-10-CM | POA: Diagnosis not present

## 2017-01-11 DIAGNOSIS — M1000 Idiopathic gout, unspecified site: Secondary | ICD-10-CM | POA: Diagnosis not present

## 2017-01-11 DIAGNOSIS — M6281 Muscle weakness (generalized): Secondary | ICD-10-CM | POA: Diagnosis not present

## 2017-01-11 DIAGNOSIS — R2689 Other abnormalities of gait and mobility: Secondary | ICD-10-CM | POA: Diagnosis not present

## 2017-01-11 DIAGNOSIS — R262 Difficulty in walking, not elsewhere classified: Secondary | ICD-10-CM | POA: Diagnosis not present

## 2017-01-12 DIAGNOSIS — R569 Unspecified convulsions: Secondary | ICD-10-CM | POA: Diagnosis not present

## 2017-01-12 DIAGNOSIS — R2689 Other abnormalities of gait and mobility: Secondary | ICD-10-CM | POA: Diagnosis not present

## 2017-01-12 DIAGNOSIS — M1000 Idiopathic gout, unspecified site: Secondary | ICD-10-CM | POA: Diagnosis not present

## 2017-01-12 DIAGNOSIS — M6281 Muscle weakness (generalized): Secondary | ICD-10-CM | POA: Diagnosis not present

## 2017-01-12 DIAGNOSIS — F0280 Dementia in other diseases classified elsewhere without behavioral disturbance: Secondary | ICD-10-CM | POA: Diagnosis not present

## 2017-01-12 DIAGNOSIS — R262 Difficulty in walking, not elsewhere classified: Secondary | ICD-10-CM | POA: Diagnosis not present

## 2017-01-15 DIAGNOSIS — R262 Difficulty in walking, not elsewhere classified: Secondary | ICD-10-CM | POA: Diagnosis not present

## 2017-01-15 DIAGNOSIS — R569 Unspecified convulsions: Secondary | ICD-10-CM | POA: Diagnosis not present

## 2017-01-15 DIAGNOSIS — M6281 Muscle weakness (generalized): Secondary | ICD-10-CM | POA: Diagnosis not present

## 2017-01-15 DIAGNOSIS — M1000 Idiopathic gout, unspecified site: Secondary | ICD-10-CM | POA: Diagnosis not present

## 2017-01-15 DIAGNOSIS — F0280 Dementia in other diseases classified elsewhere without behavioral disturbance: Secondary | ICD-10-CM | POA: Diagnosis not present

## 2017-01-15 DIAGNOSIS — R2689 Other abnormalities of gait and mobility: Secondary | ICD-10-CM | POA: Diagnosis not present

## 2017-01-22 DIAGNOSIS — E785 Hyperlipidemia, unspecified: Secondary | ICD-10-CM | POA: Diagnosis not present

## 2017-01-22 DIAGNOSIS — R296 Repeated falls: Secondary | ICD-10-CM | POA: Diagnosis not present

## 2017-01-22 DIAGNOSIS — F339 Major depressive disorder, recurrent, unspecified: Secondary | ICD-10-CM | POA: Diagnosis not present

## 2017-01-22 DIAGNOSIS — R569 Unspecified convulsions: Secondary | ICD-10-CM | POA: Diagnosis not present

## 2017-01-25 DIAGNOSIS — M1000 Idiopathic gout, unspecified site: Secondary | ICD-10-CM | POA: Diagnosis not present

## 2017-01-25 DIAGNOSIS — F0280 Dementia in other diseases classified elsewhere without behavioral disturbance: Secondary | ICD-10-CM | POA: Diagnosis not present

## 2017-01-25 DIAGNOSIS — R41841 Cognitive communication deficit: Secondary | ICD-10-CM | POA: Diagnosis not present

## 2017-01-26 DIAGNOSIS — F0280 Dementia in other diseases classified elsewhere without behavioral disturbance: Secondary | ICD-10-CM | POA: Diagnosis not present

## 2017-01-26 DIAGNOSIS — R41841 Cognitive communication deficit: Secondary | ICD-10-CM | POA: Diagnosis not present

## 2017-01-26 DIAGNOSIS — M1000 Idiopathic gout, unspecified site: Secondary | ICD-10-CM | POA: Diagnosis not present

## 2017-01-30 DIAGNOSIS — R41841 Cognitive communication deficit: Secondary | ICD-10-CM | POA: Diagnosis not present

## 2017-01-30 DIAGNOSIS — F0280 Dementia in other diseases classified elsewhere without behavioral disturbance: Secondary | ICD-10-CM | POA: Diagnosis not present

## 2017-01-30 DIAGNOSIS — M1000 Idiopathic gout, unspecified site: Secondary | ICD-10-CM | POA: Diagnosis not present

## 2017-01-31 DIAGNOSIS — R41841 Cognitive communication deficit: Secondary | ICD-10-CM | POA: Diagnosis not present

## 2017-01-31 DIAGNOSIS — F0280 Dementia in other diseases classified elsewhere without behavioral disturbance: Secondary | ICD-10-CM | POA: Diagnosis not present

## 2017-01-31 DIAGNOSIS — M1000 Idiopathic gout, unspecified site: Secondary | ICD-10-CM | POA: Diagnosis not present

## 2017-02-01 DIAGNOSIS — F0280 Dementia in other diseases classified elsewhere without behavioral disturbance: Secondary | ICD-10-CM | POA: Diagnosis not present

## 2017-02-01 DIAGNOSIS — R41841 Cognitive communication deficit: Secondary | ICD-10-CM | POA: Diagnosis not present

## 2017-02-01 DIAGNOSIS — M1000 Idiopathic gout, unspecified site: Secondary | ICD-10-CM | POA: Diagnosis not present

## 2017-02-05 DIAGNOSIS — R41841 Cognitive communication deficit: Secondary | ICD-10-CM | POA: Diagnosis not present

## 2017-02-05 DIAGNOSIS — M1000 Idiopathic gout, unspecified site: Secondary | ICD-10-CM | POA: Diagnosis not present

## 2017-02-05 DIAGNOSIS — F0280 Dementia in other diseases classified elsewhere without behavioral disturbance: Secondary | ICD-10-CM | POA: Diagnosis not present

## 2017-02-08 DIAGNOSIS — F0280 Dementia in other diseases classified elsewhere without behavioral disturbance: Secondary | ICD-10-CM | POA: Diagnosis not present

## 2017-02-08 DIAGNOSIS — M1000 Idiopathic gout, unspecified site: Secondary | ICD-10-CM | POA: Diagnosis not present

## 2017-02-08 DIAGNOSIS — E785 Hyperlipidemia, unspecified: Secondary | ICD-10-CM | POA: Diagnosis not present

## 2017-02-08 DIAGNOSIS — R569 Unspecified convulsions: Secondary | ICD-10-CM | POA: Diagnosis not present

## 2017-02-08 DIAGNOSIS — R41841 Cognitive communication deficit: Secondary | ICD-10-CM | POA: Diagnosis not present

## 2017-02-09 DIAGNOSIS — M1000 Idiopathic gout, unspecified site: Secondary | ICD-10-CM | POA: Diagnosis not present

## 2017-02-09 DIAGNOSIS — R41841 Cognitive communication deficit: Secondary | ICD-10-CM | POA: Diagnosis not present

## 2017-02-09 DIAGNOSIS — F0280 Dementia in other diseases classified elsewhere without behavioral disturbance: Secondary | ICD-10-CM | POA: Diagnosis not present

## 2017-02-12 DIAGNOSIS — R41841 Cognitive communication deficit: Secondary | ICD-10-CM | POA: Diagnosis not present

## 2017-02-12 DIAGNOSIS — M1000 Idiopathic gout, unspecified site: Secondary | ICD-10-CM | POA: Diagnosis not present

## 2017-02-12 DIAGNOSIS — F0280 Dementia in other diseases classified elsewhere without behavioral disturbance: Secondary | ICD-10-CM | POA: Diagnosis not present

## 2017-02-13 DIAGNOSIS — F0280 Dementia in other diseases classified elsewhere without behavioral disturbance: Secondary | ICD-10-CM | POA: Diagnosis not present

## 2017-02-13 DIAGNOSIS — M1000 Idiopathic gout, unspecified site: Secondary | ICD-10-CM | POA: Diagnosis not present

## 2017-02-13 DIAGNOSIS — R41841 Cognitive communication deficit: Secondary | ICD-10-CM | POA: Diagnosis not present

## 2017-02-14 DIAGNOSIS — M1000 Idiopathic gout, unspecified site: Secondary | ICD-10-CM | POA: Diagnosis not present

## 2017-02-14 DIAGNOSIS — R41841 Cognitive communication deficit: Secondary | ICD-10-CM | POA: Diagnosis not present

## 2017-02-14 DIAGNOSIS — F0280 Dementia in other diseases classified elsewhere without behavioral disturbance: Secondary | ICD-10-CM | POA: Diagnosis not present

## 2017-02-18 DIAGNOSIS — F0280 Dementia in other diseases classified elsewhere without behavioral disturbance: Secondary | ICD-10-CM | POA: Diagnosis not present

## 2017-02-18 DIAGNOSIS — R41841 Cognitive communication deficit: Secondary | ICD-10-CM | POA: Diagnosis not present

## 2017-02-18 DIAGNOSIS — M1000 Idiopathic gout, unspecified site: Secondary | ICD-10-CM | POA: Diagnosis not present

## 2017-02-19 DIAGNOSIS — R41841 Cognitive communication deficit: Secondary | ICD-10-CM | POA: Diagnosis not present

## 2017-02-19 DIAGNOSIS — F0280 Dementia in other diseases classified elsewhere without behavioral disturbance: Secondary | ICD-10-CM | POA: Diagnosis not present

## 2017-02-19 DIAGNOSIS — M1000 Idiopathic gout, unspecified site: Secondary | ICD-10-CM | POA: Diagnosis not present

## 2017-02-20 DIAGNOSIS — F0280 Dementia in other diseases classified elsewhere without behavioral disturbance: Secondary | ICD-10-CM | POA: Diagnosis not present

## 2017-02-20 DIAGNOSIS — R41841 Cognitive communication deficit: Secondary | ICD-10-CM | POA: Diagnosis not present

## 2017-02-20 DIAGNOSIS — M1000 Idiopathic gout, unspecified site: Secondary | ICD-10-CM | POA: Diagnosis not present

## 2017-02-21 DIAGNOSIS — F0280 Dementia in other diseases classified elsewhere without behavioral disturbance: Secondary | ICD-10-CM | POA: Diagnosis not present

## 2017-02-21 DIAGNOSIS — M1000 Idiopathic gout, unspecified site: Secondary | ICD-10-CM | POA: Diagnosis not present

## 2017-02-21 DIAGNOSIS — F339 Major depressive disorder, recurrent, unspecified: Secondary | ICD-10-CM | POA: Diagnosis not present

## 2017-02-21 DIAGNOSIS — E785 Hyperlipidemia, unspecified: Secondary | ICD-10-CM | POA: Diagnosis not present

## 2017-02-21 DIAGNOSIS — R41841 Cognitive communication deficit: Secondary | ICD-10-CM | POA: Diagnosis not present

## 2017-02-21 DIAGNOSIS — R569 Unspecified convulsions: Secondary | ICD-10-CM | POA: Diagnosis not present

## 2017-03-01 DIAGNOSIS — R569 Unspecified convulsions: Secondary | ICD-10-CM | POA: Diagnosis not present

## 2017-03-01 DIAGNOSIS — E785 Hyperlipidemia, unspecified: Secondary | ICD-10-CM | POA: Diagnosis not present

## 2017-03-01 DIAGNOSIS — F0280 Dementia in other diseases classified elsewhere without behavioral disturbance: Secondary | ICD-10-CM | POA: Diagnosis not present

## 2017-03-01 DIAGNOSIS — R296 Repeated falls: Secondary | ICD-10-CM | POA: Diagnosis not present

## 2017-03-02 DIAGNOSIS — R0602 Shortness of breath: Secondary | ICD-10-CM | POA: Diagnosis not present

## 2017-03-02 DIAGNOSIS — K5900 Constipation, unspecified: Secondary | ICD-10-CM | POA: Diagnosis not present

## 2017-03-02 DIAGNOSIS — E039 Hypothyroidism, unspecified: Secondary | ICD-10-CM | POA: Diagnosis not present

## 2017-03-02 DIAGNOSIS — G300 Alzheimer's disease with early onset: Secondary | ICD-10-CM | POA: Diagnosis not present

## 2017-03-02 DIAGNOSIS — E559 Vitamin D deficiency, unspecified: Secondary | ICD-10-CM | POA: Diagnosis not present

## 2017-03-02 DIAGNOSIS — E785 Hyperlipidemia, unspecified: Secondary | ICD-10-CM | POA: Diagnosis not present

## 2017-03-02 DIAGNOSIS — Z79899 Other long term (current) drug therapy: Secondary | ICD-10-CM | POA: Diagnosis not present

## 2017-03-02 DIAGNOSIS — E1121 Type 2 diabetes mellitus with diabetic nephropathy: Secondary | ICD-10-CM | POA: Diagnosis not present

## 2017-03-02 DIAGNOSIS — D649 Anemia, unspecified: Secondary | ICD-10-CM | POA: Diagnosis not present

## 2017-03-02 DIAGNOSIS — J1100 Influenza due to unidentified influenza virus with unspecified type of pneumonia: Secondary | ICD-10-CM | POA: Diagnosis not present

## 2017-03-02 DIAGNOSIS — D519 Vitamin B12 deficiency anemia, unspecified: Secondary | ICD-10-CM | POA: Diagnosis not present

## 2017-03-08 ENCOUNTER — Telehealth: Payer: Self-pay | Admitting: Family Medicine

## 2017-03-08 MED ORDER — PRAVASTATIN SODIUM 80 MG PO TABS: 80.0000 mg | ORAL_TABLET | Freq: Every day | ORAL | 0 refills | Status: AC

## 2017-03-08 NOTE — Telephone Encounter (Signed)
Received fax from [facility] requesting clarification on Prevastatin.  Paper states " Is patient still on? If so please send a 90 day supply. "

## 2017-10-01 ENCOUNTER — Telehealth: Payer: Self-pay | Admitting: Family Medicine

## 2017-10-01 NOTE — Telephone Encounter (Signed)
Pt brother stopped by and wanted to let you know that pt passed away Calvin Diaz can be reached at [phone]

## 2017-10-09 ENCOUNTER — Telehealth: Payer: Self-pay | Admitting: Family Medicine

## 2017-10-09 NOTE — Telephone Encounter (Signed)
Sympathy card sent 

## 2017-10-23 DEATH — deceased

## 2019-06-12 IMAGING — CT CT HEAD W/O CM
4 series · 17 of 47 positions shown, 19 images · non-contrast
Comparison: 10/09/2016

CLINICAL DATA: Unwitnessed fall from bed. History of stroke,
hypertension, dementia. Patient is on anticoagulation.

EXAM:
CT HEAD WITHOUT CONTRAST
TECHNIQUE: Contiguous axial images were obtained from the base of the skull
through the vertex without intravenous contrast.

[Series 3: head without · axial · non-contrast · 0.46mm/px · z∈[-72,+48]mm · 7 of 33 slices shown, 9 images]
[im 5/33  brain]
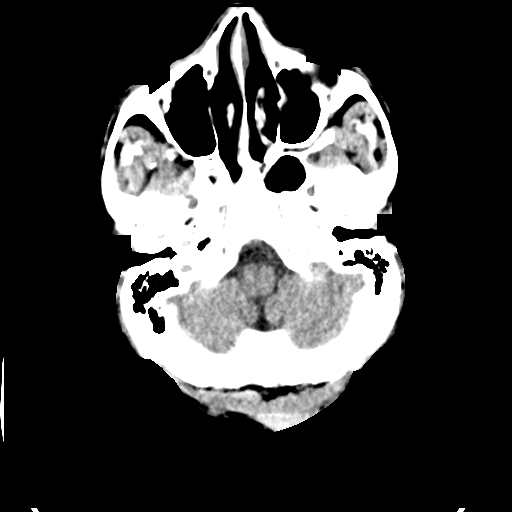
[im 5/33  bone]
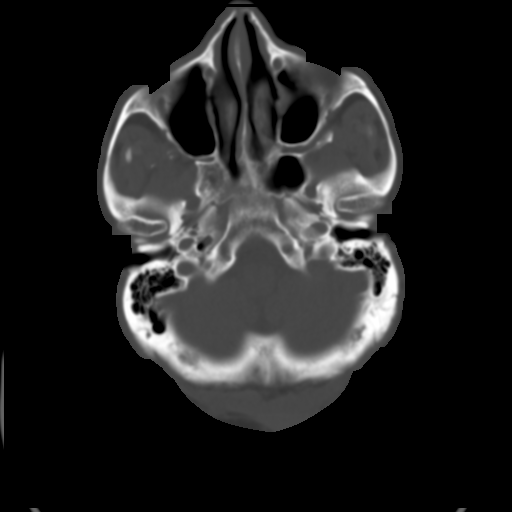
[im 9/33  brain]
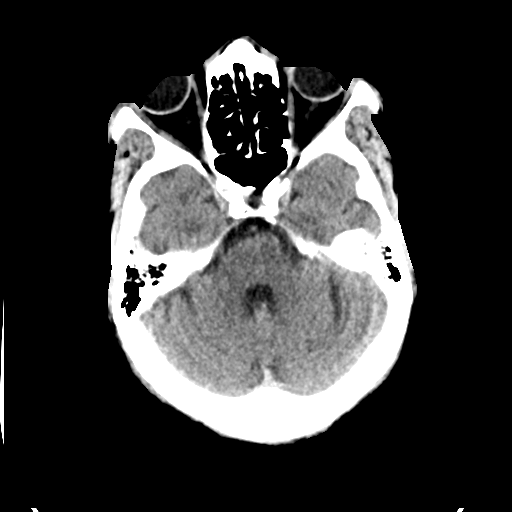
[im 13/33  brain]
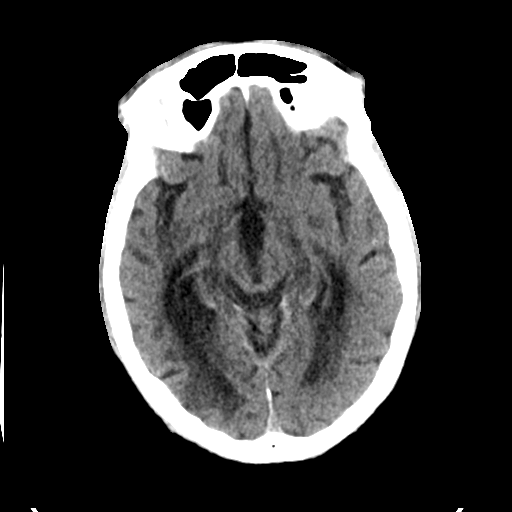
[im 17/33  brain]
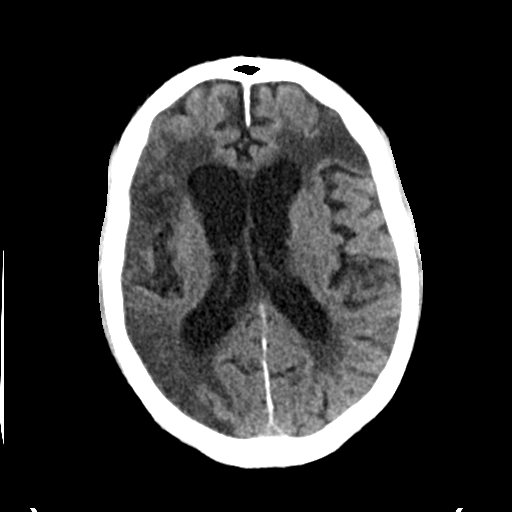
[im 21/33  brain]
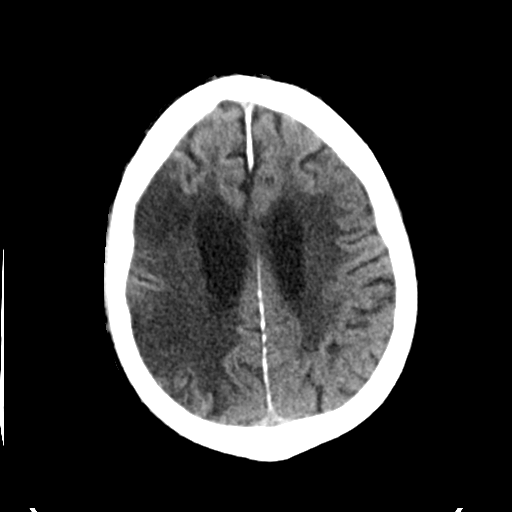
[im 21/33  bone]
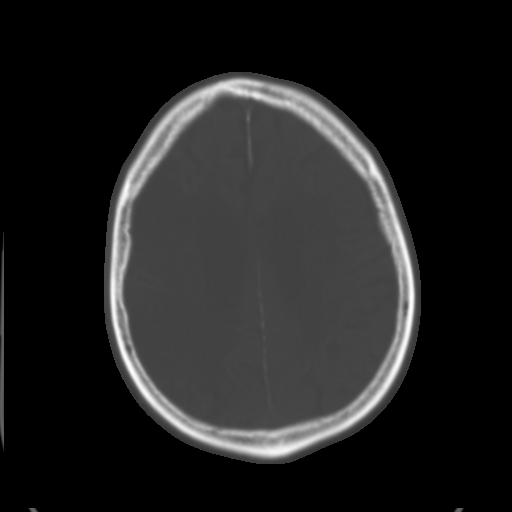
[im 25/33  brain]
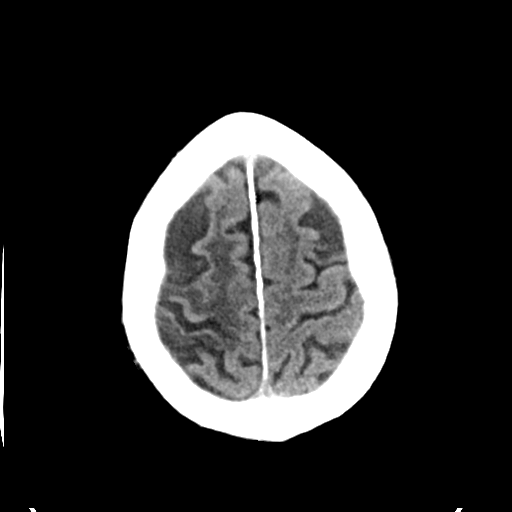
[im 29/33  brain]
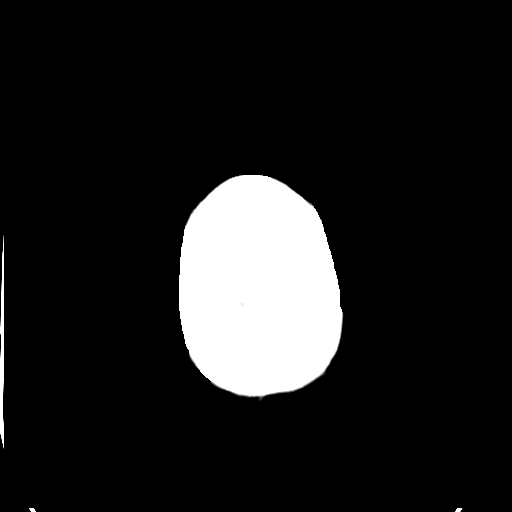

[Series 4: head bone · axial · 0.46mm/px · z∈[-76,-20]mm · 4 of 82 slices shown]
[im 9/82  bone]
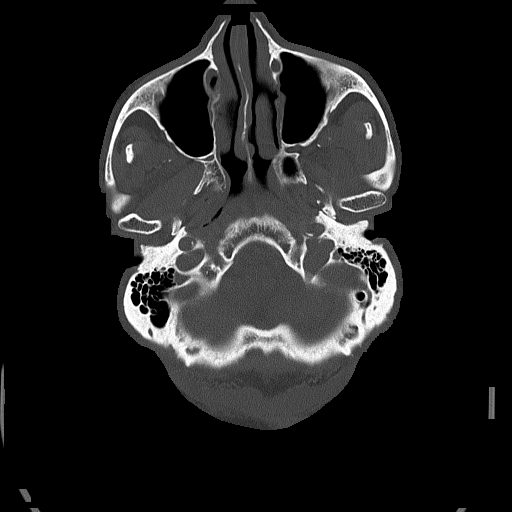
[im 17/82  bone]
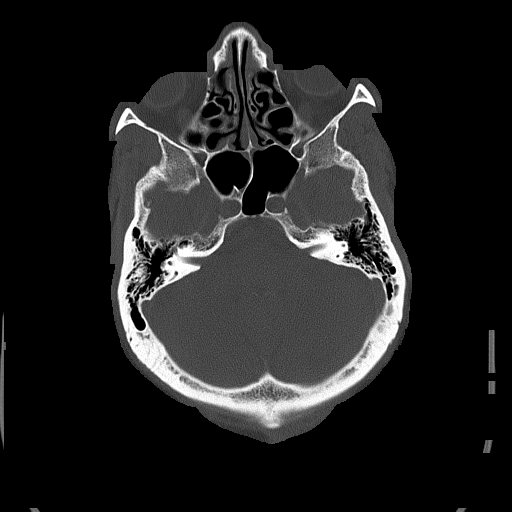
[im 25/82  bone]
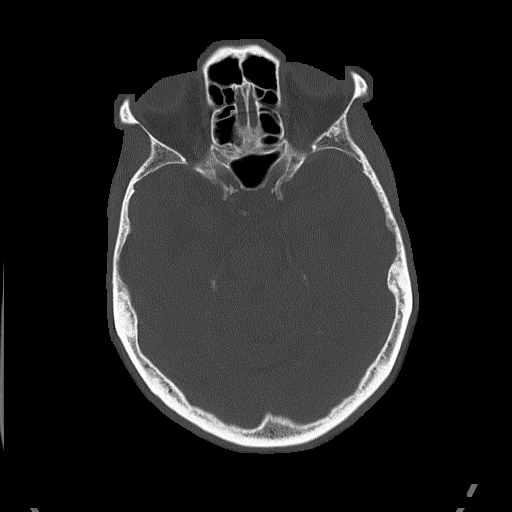
[im 37/82  bone]
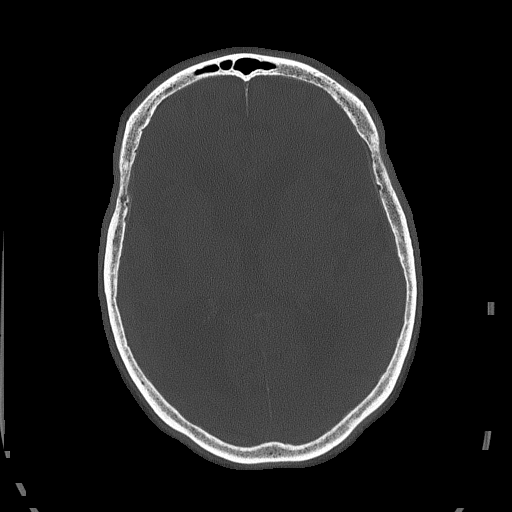

[Series 5: head without cor · coronal · non-contrast · 0.33mm/px · 3 of 68 slices shown]
[im 23/68  brain]
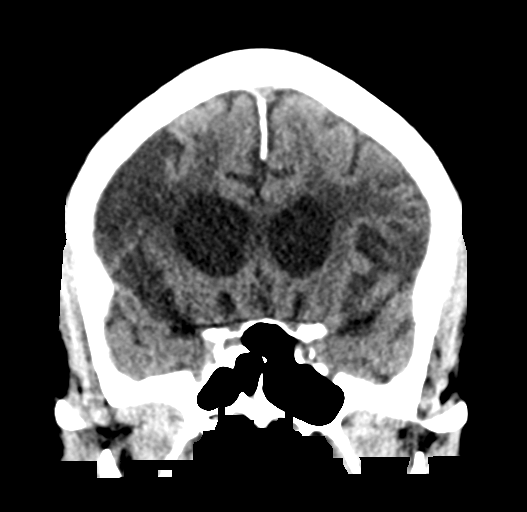
[im 30/68  brain]
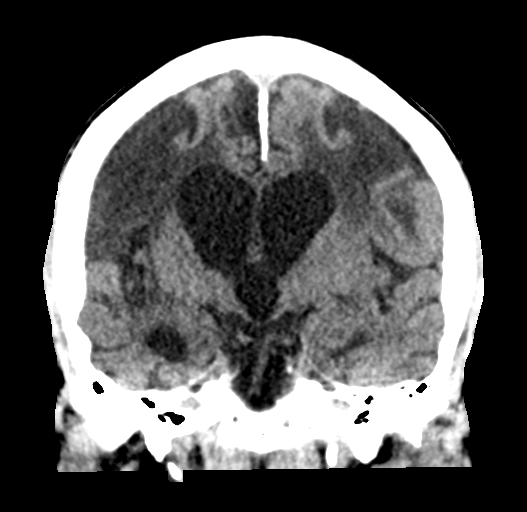
[im 38/68  brain]
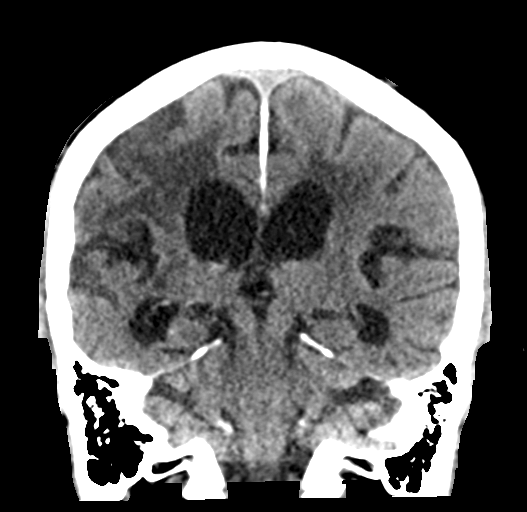

[Series 6: head without sag · sagittal · non-contrast · 0.35mm/px · 3 of 58 slices shown]
[im 20/58  brain]
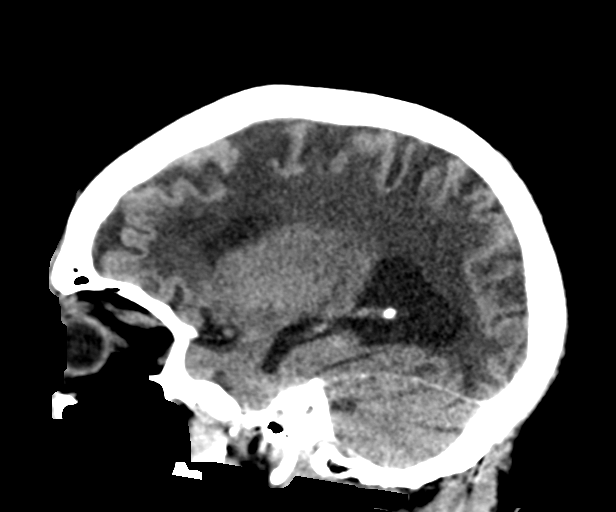
[im 29/58  brain]
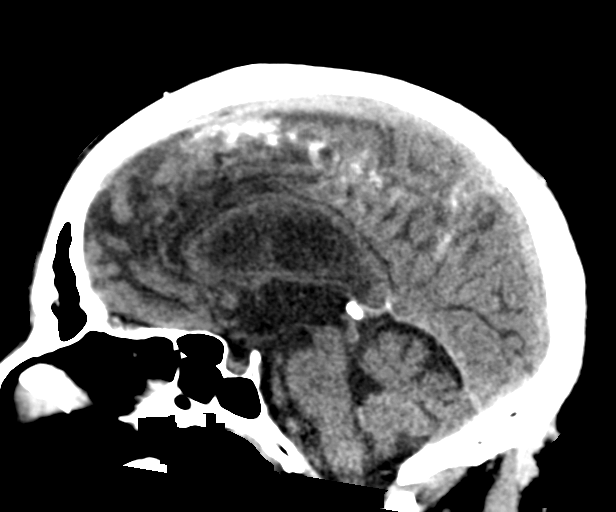
[im 39/58  brain]
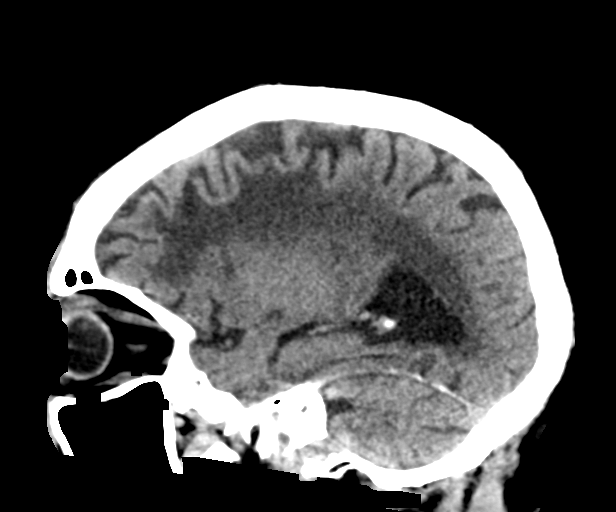

[17 of 47 positions shown; findings below may reference images not displayed]

FINDINGS: Brain: Diffuse cerebral atrophy. Ventricular dilatation consistent
with central atrophy. Low-attenuation changes in the deep white
matter consistent small vessel ischemia. Old infarcts in the right
frontoparietal temporal region and in the left frontal region. No
change since previous study. No mass effect or midline shift. No
abnormal extra-axial fluid collections. Gray-white matter junctions
are distinct. Basal cisterns are not effaced. Calcification along
the falx. No acute intracranial hemorrhage.

Vascular: Vascular calcifications in the internal carotid arteries.

Skull: The calvarium appears intact.

Sinuses/Orbits: Mucosal thickening in the paranasal sinuses. No
acute air-fluid levels. Mastoid air cells are clear.

Other: None.
IMPRESSION: No acute intracranial abnormalities. Chronic atrophy and small
vessel ischemic changes. Old bilateral cerebral infarcts.
